# Patient Record
Sex: Male | Born: 1950 | Race: White | Hispanic: No | Marital: Married | Smoking: Former smoker
Health system: Southern US, Community
[De-identification: ages and names within clinical notes are randomized; demographics above are authoritative.]

## PROBLEM LIST (undated history)

## (undated) DIAGNOSIS — G2581 Restless legs syndrome: Secondary | ICD-10-CM

## (undated) DIAGNOSIS — F329 Major depressive disorder, single episode, unspecified: Secondary | ICD-10-CM

## (undated) DIAGNOSIS — G300 Alzheimer's disease with early onset: Secondary | ICD-10-CM

## (undated) DIAGNOSIS — E559 Vitamin D deficiency, unspecified: Secondary | ICD-10-CM

## (undated) DIAGNOSIS — M109 Gout, unspecified: Secondary | ICD-10-CM

## (undated) DIAGNOSIS — R7303 Prediabetes: Secondary | ICD-10-CM

## (undated) DIAGNOSIS — G8929 Other chronic pain: Secondary | ICD-10-CM

## (undated) DIAGNOSIS — N400 Enlarged prostate without lower urinary tract symptoms: Secondary | ICD-10-CM

## (undated) DIAGNOSIS — K5900 Constipation, unspecified: Secondary | ICD-10-CM

## (undated) DIAGNOSIS — J449 Chronic obstructive pulmonary disease, unspecified: Secondary | ICD-10-CM

## (undated) DIAGNOSIS — G4700 Insomnia, unspecified: Secondary | ICD-10-CM

## (undated) DIAGNOSIS — J189 Pneumonia, unspecified organism: Secondary | ICD-10-CM

## (undated) DIAGNOSIS — N189 Chronic kidney disease, unspecified: Secondary | ICD-10-CM

## (undated) DIAGNOSIS — M21372 Foot drop, left foot: Secondary | ICD-10-CM

## (undated) DIAGNOSIS — G4730 Sleep apnea, unspecified: Secondary | ICD-10-CM

## (undated) DIAGNOSIS — E876 Hypokalemia: Secondary | ICD-10-CM

## (undated) DIAGNOSIS — E119 Type 2 diabetes mellitus without complications: Secondary | ICD-10-CM

## (undated) DIAGNOSIS — F32A Depression, unspecified: Secondary | ICD-10-CM

## (undated) DIAGNOSIS — R296 Repeated falls: Secondary | ICD-10-CM

## (undated) DIAGNOSIS — Z87442 Personal history of urinary calculi: Secondary | ICD-10-CM

## (undated) DIAGNOSIS — R413 Other amnesia: Principal | ICD-10-CM

## (undated) DIAGNOSIS — I10 Essential (primary) hypertension: Secondary | ICD-10-CM

## (undated) DIAGNOSIS — R569 Unspecified convulsions: Secondary | ICD-10-CM

## (undated) DIAGNOSIS — R630 Anorexia: Secondary | ICD-10-CM

## (undated) DIAGNOSIS — C61 Malignant neoplasm of prostate: Secondary | ICD-10-CM

## (undated) DIAGNOSIS — Z86711 Personal history of pulmonary embolism: Secondary | ICD-10-CM

## (undated) DIAGNOSIS — F419 Anxiety disorder, unspecified: Secondary | ICD-10-CM

## (undated) DIAGNOSIS — R269 Unspecified abnormalities of gait and mobility: Secondary | ICD-10-CM

## (undated) HISTORY — PX: PROSTATECTOMY: SHX69

## (undated) HISTORY — DX: Unspecified convulsions: R56.9

## (undated) HISTORY — PX: TONSILLECTOMY AND ADENOIDECTOMY: SUR1326

## (undated) HISTORY — DX: Major depressive disorder, single episode, unspecified: F32.9

## (undated) HISTORY — DX: Essential (primary) hypertension: I10

## (undated) HISTORY — DX: Other amnesia: R41.3

## (undated) HISTORY — DX: Chronic obstructive pulmonary disease, unspecified: J44.9

## (undated) HISTORY — DX: Gout, unspecified: M10.9

## (undated) HISTORY — DX: Malignant neoplasm of prostate: C61

## (undated) HISTORY — DX: Restless legs syndrome: G25.81

## (undated) HISTORY — DX: Depression, unspecified: F32.A

## (undated) HISTORY — DX: Unspecified abnormalities of gait and mobility: R26.9

## (undated) HISTORY — DX: Foot drop, left foot: M21.372

---

## 2000-02-27 ENCOUNTER — Encounter: Payer: Self-pay | Admitting: Family Medicine

## 2000-02-27 ENCOUNTER — Encounter: Admission: RE | Admit: 2000-02-27 | Discharge: 2000-02-27 | Payer: Self-pay | Admitting: Family Medicine

## 2000-08-02 ENCOUNTER — Emergency Department (HOSPITAL_COMMUNITY): Admission: EM | Admit: 2000-08-02 | Discharge: 2000-08-02 | Payer: Self-pay | Admitting: Internal Medicine

## 2004-11-28 ENCOUNTER — Ambulatory Visit: Admission: RE | Admit: 2004-11-28 | Discharge: 2005-02-13 | Payer: Self-pay | Admitting: *Deleted

## 2005-03-03 ENCOUNTER — Ambulatory Visit: Admission: RE | Admit: 2005-03-03 | Discharge: 2005-03-06 | Payer: Self-pay | Admitting: *Deleted

## 2006-12-10 ENCOUNTER — Encounter: Admission: RE | Admit: 2006-12-10 | Discharge: 2006-12-10 | Payer: Self-pay | Admitting: Family Medicine

## 2007-03-10 HISTORY — PX: COLONOSCOPY: SHX174

## 2013-01-03 ENCOUNTER — Other Ambulatory Visit: Payer: Self-pay | Admitting: Family Medicine

## 2013-01-03 DIAGNOSIS — R748 Abnormal levels of other serum enzymes: Secondary | ICD-10-CM

## 2013-01-06 ENCOUNTER — Other Ambulatory Visit: Payer: Self-pay

## 2013-01-10 ENCOUNTER — Other Ambulatory Visit: Payer: Self-pay

## 2013-01-25 ENCOUNTER — Encounter: Payer: Self-pay | Admitting: Neurology

## 2013-01-26 ENCOUNTER — Encounter: Payer: Self-pay | Admitting: Neurology

## 2013-01-26 ENCOUNTER — Ambulatory Visit (INDEPENDENT_AMBULATORY_CARE_PROVIDER_SITE_OTHER): Payer: BC Managed Care – PPO | Admitting: Neurology

## 2013-01-26 ENCOUNTER — Encounter (INDEPENDENT_AMBULATORY_CARE_PROVIDER_SITE_OTHER): Payer: Self-pay

## 2013-01-26 VITALS — BP 142/89 | HR 102 | Ht 69.0 in | Wt 184.0 lb

## 2013-01-26 DIAGNOSIS — R413 Other amnesia: Secondary | ICD-10-CM | POA: Insufficient documentation

## 2013-01-26 HISTORY — DX: Other amnesia: R41.3

## 2013-01-26 NOTE — Progress Notes (Signed)
Reason for visit: Memory disturbance  Lucas Vega is a 62 y.o. male  History of present illness:  Lucas Vega is a 62 year old left-handed white male with a history of a problem with a memory disturbance over the last one year. The patient is working, and he is having problems keeping up on the job, remembering how to perform certain tasks. The patient also has difficulty with remembering names for people, and recent events. The patient operates a motor vehicle, and has some problems with directions. The patient is able to keep up with his bills, and get them paid on time. The patient has been under a lot of stress recently with a divorce. The patient is sleeping well, and he denies any problems with excessive daytime drowsiness. The patient denies any balance issues or problems controlling the bowels or the bladder with the exception that he does have some mild incontinence with the bladder following prostate surgery for prostate cancer. The patient indicates that he drinks alcohol regularly, 2-4 glasses of wine daily. The patient admits to a history of alcohol overuse in the past. Recent blood work has shown mild elevation in liver enzymes. The patient is sent to this office for an evaluation. A TSH done recently was normal. The patient has a history of diabetes, but his most recent hemoglobin A1c was 5.2. The patient believes that his depression problems are not well controlled.  Past Medical History  Diagnosis Date  . Hypertension   . COPD (chronic obstructive pulmonary disease)   . Depression   . RLS (restless legs syndrome)   . Gout   . Prostate cancer   . Diabetes   . Memory disturbance 01/26/2013    Past Surgical History  Procedure Laterality Date  . Tonsillectomy and adenoidectomy    . Prostatectomy    . Colonoscopy  2009    JAN    Family History  Problem Relation Age of Onset  . Dementia Mother   . Pancreatic cancer Father     Social history:  reports that he  quit smoking about 3 months ago. He has never used smokeless tobacco. He reports that he drinks alcohol. He reports that he does not use illicit drugs.  Medications:  Current Outpatient Prescriptions on File Prior to Visit  Medication Sig Dispense Refill  . atenolol-chlorthalidone (TENORETIC) 50-25 MG per tablet Take 1 tablet by mouth daily.      Marland Kitchen buPROPion (WELLBUTRIN XL) 150 MG 24 hr tablet Take 150 mg by mouth 2 (two) times daily.      . hydrOXYzine (ATARAX/VISTARIL) 25 MG tablet Take 25 mg by mouth 3 (three) times daily as needed.       No current facility-administered medications on file prior to visit.     No Known Allergies  ROS:  Out of a complete 14 system review of symptoms, the patient complains only of the following symptoms, and all other reviewed systems are negative.  Blurred vision Snoring Urination problems Memory loss Depression, anxiety, and disinterest in activities, suicidal thoughts Restless legs  Blood pressure 142/89, pulse 102, height 5\' 9"  (1.753 m), weight 184 lb (83.462 kg).  Physical Exam  General: The patient is alert and cooperative at the time of the examination.  Head: Pupils are equal, round, and reactive to light. Discs are flat bilaterally.  Neck: The neck is supple, no carotid bruits are noted.  Respiratory: The respiratory examination is clear.  Cardiovascular: The cardiovascular examination reveals a regular rate and rhythm, no  obvious murmurs or rubs are noted.  Skin: Extremities are without significant edema.  Neurologic Exam  Mental status: The mental status examination done today shows a total score of 27/30.  Cranial nerves: Facial symmetry is present. There is good sensation of the face to pinprick and soft touch bilaterally. The strength of the facial muscles and the muscles to head turning and shoulder shrug are normal bilaterally. Speech is well enunciated, no aphasia or dysarthria is noted. Extraocular movements are full.  Visual fields are full.  Motor: The motor testing reveals 5 over 5 strength of all 4 extremities. Good symmetric motor tone is noted throughout.  Sensory: Sensory testing is intact to pinprick, soft touch, vibration sensation, and position sense on all 4 extremities. No evidence of extinction is noted.  Coordination: Cerebellar testing reveals good finger-nose-finger and heel-to-shin bilaterally. A mild intention tremor seen with finger-nose-finger bilaterally.  Gait and station: Gait is normal. Tandem gait is normal. Romberg is negative. No drift is seen.  Reflexes: Deep tendon reflexes are symmetric and normal bilaterally. Toes are downgoing bilaterally.   Assessment/Plan:  1. Memory disturbance  2. Depression and anxiety  3. Alcohol overuse  The patient is under a lot of stress recently, and he is likely is using too much alcohol. The patient reports a memory issue over the last one year. The patient will undergo further blood work today, and an MRI of the brain. Neuropsychological testing will be done to help differentiate an organic dementing illness from a pseudodementia associated with depression. The patient will followup in 6 months.  Marlan Palau MD 01/26/2013 7:20 PM  [facility] [address] [place], [place] [address]  Phone [phone] Fax [phone]

## 2013-01-30 LAB — RPR: RPR: NONREACTIVE

## 2013-01-30 LAB — VITAMIN B12: Vitamin B-12: 343 pg/mL (ref 211–946)

## 2013-01-31 ENCOUNTER — Telehealth: Payer: Self-pay

## 2013-01-31 NOTE — Telephone Encounter (Signed)
Message copied by Doree Barthel on Tue Jan 31, 2013 10:33 AM ------      Message from: Stephanie Acre      Created: Mon Jan 30, 2013  7:30 AM       Please call the patient. The blood work results are unremarkable. Thank you.            ----- Message -----         From: [facility] Lab Results In Interface         Sent: 01/30/2013   5:37 AM           To: York Spaniel, MD                   ------

## 2013-01-31 NOTE — Telephone Encounter (Signed)
Gave lab results. Left vmail.

## 2013-02-16 ENCOUNTER — Ambulatory Visit (INDEPENDENT_AMBULATORY_CARE_PROVIDER_SITE_OTHER): Payer: BC Managed Care – PPO

## 2013-02-16 DIAGNOSIS — R413 Other amnesia: Secondary | ICD-10-CM

## 2013-02-17 ENCOUNTER — Telehealth: Payer: Self-pay | Admitting: Neurology

## 2013-02-17 DIAGNOSIS — R413 Other amnesia: Secondary | ICD-10-CM

## 2013-02-17 NOTE — Telephone Encounter (Signed)
I called the patient. MRI the brain showed minimal small vessel changes. Some atrophy is seen. No acute changes are noted. The patient has not yet heard about the neuropsychological evaluation, the order was placed previously, I will reorder.

## 2013-03-09 DIAGNOSIS — G300 Alzheimer's disease with early onset: Secondary | ICD-10-CM

## 2013-03-09 HISTORY — DX: Alzheimer's disease with early onset: G30.0

## 2013-07-28 ENCOUNTER — Encounter: Payer: Self-pay | Admitting: Neurology

## 2013-07-28 ENCOUNTER — Encounter (INDEPENDENT_AMBULATORY_CARE_PROVIDER_SITE_OTHER): Payer: Self-pay

## 2013-07-28 ENCOUNTER — Ambulatory Visit (INDEPENDENT_AMBULATORY_CARE_PROVIDER_SITE_OTHER): Payer: No Typology Code available for payment source | Admitting: Neurology

## 2013-07-28 VITALS — BP 140/86 | HR 64 | Wt 194.0 lb

## 2013-07-28 DIAGNOSIS — R413 Other amnesia: Secondary | ICD-10-CM

## 2013-07-28 MED ORDER — DONEPEZIL HCL 5 MG PO TABS: 5.0000 mg | ORAL_TABLET | Freq: Every day | ORAL | Status: DC

## 2013-07-28 NOTE — Patient Instructions (Signed)
   Begin Aricept (donepezil) at 5 mg at night for one month. If this medication is well-tolerated, please call our office and we will call in a prescription for the 10 mg tablets. Look out for side effects that may include nausea, diarrhea, weight loss, or stomach cramps. This medication will also cause a runny nose, therefore there is no need for allergy medications for this purpose.  

## 2013-07-28 NOTE — Progress Notes (Signed)
Reason for visit: Memory disturbance  Lucas Vega is an 63 y.o. male  History of present illness:  Lucas Vega is a 63 year old left-handed white male with a history of a progressive memory disturbance. The patient has a history of depression and a history of alcohol overuse. His wife indicates that he has had some progression of memory since last seen in November 2014. He is having increasing problems with short-term memory, and difficulty operating appliances in the house, such as figuring out how to use the coffeemaker. The patient has not given up any activities of daily living since he was seen last. The patient does operate a motor vehicle, but he will have some problems with directions while driving. He has difficulty keeping up with his medications. He indicates that the depression remains an issue. Neuropsychological evaluations have been ordered on 2 occasions, but the patient has not yet had any testing done. He returns for an evaluation.  Past Medical History  Diagnosis Date  . Hypertension   . COPD (chronic obstructive pulmonary disease)   . Depression   . RLS (restless legs syndrome)   . Gout   . Prostate cancer   . Diabetes   . Memory disturbance 01/26/2013    Past Surgical History  Procedure Laterality Date  . Tonsillectomy and adenoidectomy    . Prostatectomy    . Colonoscopy  2009    JAN    Family History  Problem Relation Age of Onset  . Dementia Mother   . Pancreatic cancer Father     Social history:  reports that he quit smoking about 9 months ago. He has never used smokeless tobacco. He reports that he drinks alcohol. He reports that he does not use illicit drugs.   No Known Allergies  Medications:  Current Outpatient Prescriptions on File Prior to Visit  Medication Sig Dispense Refill  . atenolol-chlorthalidone (TENORETIC) 50-25 MG per tablet Take 1 tablet by mouth daily.      Marland Kitchen buPROPion (WELLBUTRIN XL) 150 MG 24 hr tablet Take 150 mg by  mouth 2 (two) times daily.      . hydrOXYzine (ATARAX/VISTARIL) 25 MG tablet Take 25 mg by mouth 3 (three) times daily as needed.      . simvastatin (ZOCOR) 20 MG tablet Take 20 mg by mouth daily.       No current facility-administered medications on file prior to visit.    ROS:  Out of a complete 14 system review of symptoms, the patient complains only of the following symptoms, and all other reviewed systems are negative.  Restless legs, snoring Incontinence of the bladder, blood in the urine Walking difficulties, knee pain Memory loss Confusion, decreased concentration, depression, nervousness   Blood pressure 140/86, pulse 64, weight 194 lb (87.998 kg).  Physical Exam  General: The patient is alert and cooperative at the time of the examination.  Skin: No significant peripheral edema is noted.   Neurologic Exam  Mental status: The Mini-Mental status examination done today shows a total score 26/30.  Cranial nerves: Facial symmetry is present. Speech is normal, no aphasia or dysarthria is noted. Extraocular movements are full. Visual fields are full.  Motor: The patient has good strength in all 4 extremities.  Sensory examination: Soft touch sensation is symmetric on the face, arms, and legs.  Coordination: The patient has good finger-nose-finger and heel-to-shin bilaterally.  Gait and station: The patient has a limping gait on the right leg. Tandem gait is slightly unsteady.  Romberg is negative. No drift is seen.  Reflexes: Deep tendon reflexes are symmetric.   Assessment/Plan:  One. Progressive memory disturbance  The patient clinically is worsening some with his memory. His mother had onset of Alzheimer disease in her early 14s. The patient will be placed on Aricept at this time. Given the history of alcohol overuse and depression, the patient once again will be referred for neuropsychological evaluation. The driving issue needs to be scrutinized closely, and  this activity will need to be curtailed if safety issues are noted. He will followup otherwise in 6 months.  Jill Alexanders MD 07/28/2013 9:16 AM  [facility] [address] [place], [place] [address]  Phone [phone] Fax [phone]

## 2013-10-06 ENCOUNTER — Telehealth: Payer: Self-pay | Admitting: Neurology

## 2013-10-06 MED ORDER — DONEPEZIL HCL 5 MG PO TABS: 5.0000 mg | ORAL_TABLET | Freq: Every day | ORAL | Status: DC

## 2013-10-06 NOTE — Telephone Encounter (Signed)
Spouse stated she called pharmacy for Rx refill for donepezil (ARICEPT) 5 MG tablet.  They informed her that they contacted our office and hadn't heard back.  Please send Rx refill to [facility] on [address].  Please call anytime and advise, if not available please leave message.  Thanks

## 2013-10-06 NOTE — Telephone Encounter (Signed)
Unfortunately, we have not gotten a request for this patient as of the present time.  There is not a [facility] listed on [address].  I called back to verify pharmacy info.  They would like it sent to [facility] on [address].  Rx has been sent.

## 2013-11-30 DIAGNOSIS — R413 Other amnesia: Secondary | ICD-10-CM

## 2013-12-08 ENCOUNTER — Telehealth: Payer: Self-pay | Admitting: Neurology

## 2013-12-08 MED ORDER — DONEPEZIL HCL 10 MG PO TABS: 10.0000 mg | ORAL_TABLET | Freq: Every day | ORAL | Status: DC

## 2013-12-08 NOTE — Telephone Encounter (Signed)
I called patient. 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 The patient is on low-dose Aricept at this time. I will increase the dose to a 10 mg tablet of Aricept at night. He will followup in December of 2015.

## 2014-01-03 ENCOUNTER — Telehealth: Payer: Self-pay | Admitting: Neurology

## 2014-01-03 MED ORDER — DONEPEZIL HCL 10 MG PO TABS: 10.0000 mg | ORAL_TABLET | Freq: Every day | ORAL | Status: DC

## 2014-01-03 NOTE — Telephone Encounter (Signed)
Eddy, Chartered certified accountant at [facility] [phone], stated pt was there to pick up new Rx refill for donepezil (ARICEPT) 10 MG tablet.  Did not have record of where Rx was received on there end. I informed her our records showed fax confirmation.  Please resend.

## 2014-01-03 NOTE — Telephone Encounter (Signed)
Rx was previously sent:  donepezil (ARICEPT) 10 MG tablet 90 tablet 1 12/08/2013     Sig - Route: Take 1 tablet (10 mg total) by mouth at bedtime. - Oral    E-Prescribing Status: Receipt confirmed by pharmacy (12/08/2013 4:01 PM EDT)                Pharmacy    [facility] [place], [place]   I have resent the Rx.

## 2014-01-24 ENCOUNTER — Encounter: Payer: Self-pay | Admitting: Neurology

## 2014-01-30 ENCOUNTER — Encounter: Payer: Self-pay | Admitting: Neurology

## 2014-02-06 ENCOUNTER — Encounter: Payer: Self-pay | Admitting: Adult Health

## 2014-02-06 ENCOUNTER — Ambulatory Visit (INDEPENDENT_AMBULATORY_CARE_PROVIDER_SITE_OTHER): Payer: No Typology Code available for payment source | Admitting: Adult Health

## 2014-02-06 VITALS — BP 142/80 | HR 51 | Temp 96.5°F | Ht 69.0 in | Wt 180.0 lb

## 2014-02-06 DIAGNOSIS — R413 Other amnesia: Secondary | ICD-10-CM

## 2014-02-06 MED ORDER — MEMANTINE HCL 28 X 5 MG & 21 X 10 MG PO TABS: ORAL_TABLET | ORAL | Status: DC

## 2014-02-06 NOTE — Progress Notes (Signed)
I have read the note, and I agree with the clinical assessment and plan.  WILLIS,CHARLES KEITH   

## 2014-02-06 NOTE — Progress Notes (Addendum)
PATIENT: Lucas Vega DOB: 25-Oct-1950  REASON FOR VISIT: follow up HISTORY FROM: patient  HISTORY OF PRESENT ILLNESS: Mr. Lucas Vega is a 63 year old male with a history of a progressive memory disorder. He returns today for follow-up. He is currently taking Aricept 10 mg daily. He reports that his memory has gotten worse since the last visit. His wife reports that it might be slightly worse but nothing drastic.  The patient is able to complete all ADLs independently. Denies having to give up anything due to his memory. He continues to operate a motor vehicle without difficulty. Husband does do some of the finances and has noticed some issues with remembering it things have or have not been paid. Although he states that he has always been like this.,Wife has always done the cooking at home.   HISTORY 07/28/13 (WILLIS): Mr. Lucas Vega is a 63 year old left-handed white male with a history of a progressive memory disturbance. The patient has a history of depression and a history of alcohol overuse. His wife indicates that he has had some progression of memory since last seen in November 2014. He is having increasing problems with short-term memory, and difficulty operating appliances in the house, such as figuring out how to use the coffeemaker. The patient has not given up any activities of daily living since he was seen last. The patient does operate a motor vehicle, but he will have some problems with directions while driving. He has difficulty keeping up with his medications. He indicates that the depression remains an issue. Neuropsychological evaluations have been ordered on 2 occasions, but the patient has not yet had any testing done. He returns for an evaluation.  REVIEW OF SYSTEMS: Out of a complete 14 system review of symptoms, the patient complains only of the following symptoms, and all other reviewed systems are negative.  Memory loss Depression Nervous/anxious Restless  leg  ALLERGIES: No Known Allergies  HOME MEDICATIONS: Outpatient Prescriptions Prior to Visit  Medication Sig Dispense Refill  . allopurinol (ZYLOPRIM) 100 MG tablet Take 100 mg by mouth daily.    Marland Kitchen atenolol-chlorthalidone (TENORETIC) 50-25 MG per tablet Take 1 tablet by mouth daily.    Marland Kitchen buPROPion (WELLBUTRIN XL) 150 MG 24 hr tablet Take 150 mg by mouth 2 (two) times daily.    Marland Kitchen donepezil (ARICEPT) 10 MG tablet Take 1 tablet (10 mg total) by mouth at bedtime. 90 tablet 1  . simvastatin (ZOCOR) 20 MG tablet Take 20 mg by mouth daily.    . hydrOXYzine (ATARAX/VISTARIL) 25 MG tablet Take 25 mg by mouth 3 (three) times daily as needed.     No facility-administered medications prior to visit.    PAST MEDICAL HISTORY: Past Medical History  Diagnosis Date  . Hypertension   . COPD (chronic obstructive pulmonary disease)   . Depression   . RLS (restless legs syndrome)   . Gout   . Prostate cancer   . Diabetes   . Memory disturbance 01/26/2013    PAST SURGICAL HISTORY: Past Surgical History  Procedure Laterality Date  . Tonsillectomy and adenoidectomy    . Prostatectomy    . Colonoscopy  2009    JAN    FAMILY HISTORY: Family History  Problem Relation Age of Onset  . Dementia Mother   . Pancreatic cancer Father     SOCIAL HISTORY: History   Social History  . Marital Status: Married    Spouse Name: N/A    Number of Children: 2  . Years of  Education: college   Occupational History  . [employer] Other   Social History Main Topics  . Smoking status: Former Smoker    Quit date: 09/29/2012  . Smokeless tobacco: Never Used  . Alcohol Use: Yes     Comment: 2-4 GLASSES OF WINE PER DAY  . Drug Use: No  . Sexual Activity: Not on file   Other Topics Concern  . Not on file   Social History Narrative      PHYSICAL EXAM  Filed Vitals:   02/06/14 1434  BP: 142/80  Pulse: 51  Temp: 96.5 F (35.8 C)  TempSrc: Oral  Height: 5\' 9"  (1.753 m)  Weight: 180 lb  (81.647 kg)   Body mass index is 26.57 kg/(m^2).  Generalized: Well developed, in no acute distress   Neurological examination  Mentation: Alert oriented to time, place, history taking. Follows all commands speech and language fluent. MMSE 28/30 Cranial nerve II-XII: Pupils were equal round reactive to light. Extraocular movements were full, visual field were full on confrontational test. Facial sensation and strength were normal. Uvula tongue midline. Head turning and shoulder shrug  were normal and symmetric. Motor: The motor testing reveals 5 over 5 strength of all 4 extremities. Good symmetric motor tone is noted throughout.  Sensory: Sensory testing is intact to soft touch on all 4 extremities. No evidence of extinction is noted.  Coordination: Cerebellar testing reveals good finger-nose-finger and heel-to-shin bilaterally but does require prompting with finger-nose-finger.  Gait and station: Gait is normal. Tandem gait is normal. Romberg is negative. No drift is seen.  Reflexes: Deep tendon reflexes are symmetric and normal bilaterally.    DIAGNOSTIC DATA (LABS, IMAGING, TESTING) - I reviewed patient records, labs, notes, testing and imaging myself where available.   Lab Results  Component Value Date   OINOMVEH20 947 01/26/2013   No results found for: TSH    ASSESSMENT AND PLAN 63 y.o. year old male  has a past medical history of Hypertension; COPD (chronic obstructive pulmonary disease); Depression; RLS (restless legs syndrome); Gout; Prostate cancer; Diabetes; and Memory disturbance (01/26/2013). here with:  1. Memory loss  The patient's memory has remained stable since the last visit. His MMSE is 28/30 today and was 26/30 at the previous visit. The patient is currently taking Aricept 10 mg daily and tolerating it well. He will continue this. The patient and his wife would like to go ahead and start Namenda considering he has a family history of Alzheimer's. I will start the  Namenda titration pack. If the patient is unable to tolerate this medication he will let us know. He will follow-up in 6 months or sooner if needed.   Ward Givens, MSN, NP-C 02/06/2014, 2:45 PM [facility] [address], [place], [place] [address] [phone]  Note: This document was prepared with digital dictation and possible smart phrase technology. Any transcriptional errors that result from this process are unintentional.

## 2014-02-06 NOTE — Patient Instructions (Signed)
Memantine Tablets  What is this medicine?  MEMANTINE (MEM an teen) is used to treat dementia caused by Alzheimer's disease.  This medicine may be used for other purposes; ask your health care provider or pharmacist if you have questions.  COMMON BRAND NAME(S): Namenda  What should I tell my health care provider before I take this medicine?  They need to know if you have any of these conditions:  -difficulty passing urine  -kidney disease  -liver disease  -seizures  -an unusual or allergic reaction to memantine, other medicines, foods, dyes, or preservatives  -pregnant or trying to get pregnant  -breast-feeding  How should I use this medicine?  Take this medicine by mouth with a glass of water. Follow the directions on the prescription label. You may take this medicine with or without food. Take your doses at regular intervals. Do not take your medicine more often than directed. Continue to take your medicine even if you feel better. Do not stop taking except on the advice of your doctor or health care professional.  Talk to your pediatrician regarding the use of this medicine in children. Special care may be needed.  Overdosage: If you think you have taken too much of this medicine contact a poison control center or emergency room at once.  NOTE: This medicine is only for you. Do not share this medicine with others.  What if I miss a dose?  If you miss a dose, take it as soon as you can. If it is almost time for your next dose, take only that dose. Do not take double or extra doses. If you do not take your medicine for several days, contact your health care provider. Your dose may need to be changed.  What may interact with this medicine?  -acetazolamide  -amantadine  -cimetidine  -dextromethorphan  -dofetilide  -hydrochlorothiazide  -ketamine  -metformin  -methazolamide  -quinidine  -ranitidine  -sodium bicarbonate  -triamterene  This list may not describe all possible interactions. Give your health care provider a  list of all the medicines, herbs, non-prescription drugs, or dietary supplements you use. Also tell them if you smoke, drink alcohol, or use illegal drugs. Some items may interact with your medicine.  What should I watch for while using this medicine?  Visit your doctor or health care professional for regular checks on your progress. Check with your doctor or health care professional if there is no improvement in your symptoms or if they get worse.  You may get drowsy or dizzy. Do not drive, use machinery, or do anything that needs mental alertness until you know how this drug affects you. Do not stand or sit up quickly, especially if you are an older patient. This reduces the risk of dizzy or fainting spells. Alcohol can make you more drowsy and dizzy. Avoid alcoholic drinks.  What side effects may I notice from receiving this medicine?  Side effects that you should report to your doctor or health care professional as soon as possible:  -allergic reactions like skin rash, itching or hives, swelling of the face, lips, or tongue  -agitation or a feeling of restlessness  -depressed mood  -dizziness  -hallucinations  -redness, blistering, peeling or loosening of the skin, including inside the mouth  -seizures  -vomiting  Side effects that usually do not require medical attention (report to your doctor or health care professional if they continue or are bothersome):  -constipation  -diarrhea  -headache  -nausea  -trouble sleeping  This   list may not describe all possible side effects. Call your doctor for medical advice about side effects. You may report side effects to FDA at 1-800-FDA-1088.  Where should I keep my medicine?  Keep out of the reach of children.  Store at room temperature between 15 degrees and 30 degrees C (59 degrees and 86 degrees F). Throw away any unused medicine after the expiration date.  NOTE: This sheet is a summary. It may not cover all possible information. If you have questions about this  medicine, talk to your doctor, pharmacist, or health care provider.  © 2015, [employer]. (2012-12-12 14:10:42)

## 2014-05-30 ENCOUNTER — Other Ambulatory Visit: Payer: Self-pay | Admitting: Family Medicine

## 2014-05-30 DIAGNOSIS — R569 Unspecified convulsions: Secondary | ICD-10-CM

## 2014-05-31 ENCOUNTER — Ambulatory Visit
Admission: RE | Admit: 2014-05-31 | Discharge: 2014-05-31 | Disposition: A | Discharge status: Home / Self Care | Payer: 59 | Source: Ambulatory Visit | Attending: Family Medicine | Admitting: Family Medicine

## 2014-05-31 DIAGNOSIS — R569 Unspecified convulsions: Secondary | ICD-10-CM

## 2014-05-31 MED ORDER — GADOBENATE DIMEGLUMINE 529 MG/ML IV SOLN
17.0000 mL | Freq: Once | INTRAVENOUS | Status: AC | PRN
Start: 2014-05-31 — End: 2014-05-31
  Administered 2014-05-31: 17 mL via INTRAVENOUS

## 2014-06-07 ENCOUNTER — Telehealth: Payer: Self-pay | Admitting: *Deleted

## 2014-06-07 NOTE — Telephone Encounter (Signed)
I called the patient. Dr. Marisue Humble ordered the MRI, the results indicate very minimal small vessel ischemic changes. Nothing that should be resulting in significant memory issues. They are to contact our office if they have any further questions.    MRI brain 05/31/14:  IMPRESSION: Mild atrophy and mild chronic microvascular ischemic change. No acute infarct or mass lesion.

## 2014-06-07 NOTE — Telephone Encounter (Signed)
Patient and wife calling in for MRI results, states that they were informed by Dr Andrew Au office to contact Dr Jannifer Franklin for results.

## 2014-06-27 ENCOUNTER — Institutional Professional Consult (permissible substitution): Payer: No Typology Code available for payment source | Admitting: Neurology

## 2014-07-05 ENCOUNTER — Encounter: Payer: Self-pay | Admitting: Neurology

## 2014-07-05 ENCOUNTER — Ambulatory Visit (INDEPENDENT_AMBULATORY_CARE_PROVIDER_SITE_OTHER): Payer: 59 | Admitting: Neurology

## 2014-07-05 VITALS — BP 139/85 | HR 59 | Ht 69.0 in | Wt 180.6 lb

## 2014-07-05 DIAGNOSIS — R413 Other amnesia: Secondary | ICD-10-CM | POA: Diagnosis not present

## 2014-07-05 DIAGNOSIS — R569 Unspecified convulsions: Secondary | ICD-10-CM

## 2014-07-05 DIAGNOSIS — M25511 Pain in right shoulder: Secondary | ICD-10-CM | POA: Insufficient documentation

## 2014-07-05 HISTORY — DX: Unspecified convulsions: R56.9

## 2014-07-05 MED ORDER — DONEPEZIL HCL 5 MG PO TABS: 5.0000 mg | ORAL_TABLET | Freq: Every day | ORAL | Status: DC

## 2014-07-05 MED ORDER — MEMANTINE HCL 5 MG PO TABS: ORAL_TABLET | ORAL | Status: DC

## 2014-07-05 NOTE — Progress Notes (Signed)
Reason for visit: Seizure  Referring physician: Dr. Mayra Neer  Lucas Vega is a 64 y.o. male  History of present illness:  Mr. Poland is a 64 year old left-handed white male with a history of a mild memory disturbance. The patient has been on Aricept taking 10 mg at night. He recently suffered a generalized tonic-clonic seizure that was witnessed by his wife that came out of sleep on 05/27/2014. The patient was convulsing for about 3 or 4 minutes, and then the issue resolved. The patient did not bite his tongue, he did not lose control the bladder. The patient has never had any prior history of seizures, and no family history of seizures. The patient reports that he injured his right shoulder following the seizure, he has had restriction of his ability to abduct the arm since that time. The patient has had some headaches in the afternoon that are mild, and he reports some blurring of vision. He had MRI evaluation of the brain that did not reveal any acute changes. He drinks 2 glasses of wine daily on average, and no change in alcohol intake has been noted. He returns to the office today for an evaluation. He has been operating a motor vehicle.  Past Medical History  Diagnosis Date  . Hypertension   . COPD (chronic obstructive pulmonary disease)   . Depression   . RLS (restless legs syndrome)   . Gout   . Prostate cancer   . Diabetes   . Memory disturbance 01/26/2013  . Convulsions/seizures 07/05/2014    Past Surgical History  Procedure Laterality Date  . Tonsillectomy and adenoidectomy    . Prostatectomy    . Colonoscopy  2009    JAN    Family History  Problem Relation Age of Onset  . Dementia Mother   . Pancreatic cancer Father   . Seizures Neg Hx     Social history:  reports that he quit smoking about 21 months ago. He has never used smokeless tobacco. He reports that he drinks about 8.4 oz of alcohol per week. He reports that he does not use illicit  drugs.  Medications:  Prior to Admission medications   Medication Sig Start Date End Date Taking? Authorizing Provider  allopurinol (ZYLOPRIM) 100 MG tablet Take 100 mg by mouth daily. 05/11/13  Yes Historical Provider, MD  aspirin 81 MG tablet Take 81 mg by mouth daily.   Yes Historical Provider, MD  atenolol-chlorthalidone (TENORETIC) 50-25 MG per tablet Take 1 tablet by mouth daily.   Yes Historical Provider, MD  buPROPion (WELLBUTRIN XL) 150 MG 24 hr tablet Take 150 mg by mouth 2 (two) times daily.   Yes Historical Provider, MD  colchicine 0.6 MG tablet Take 0.6 mg by mouth daily.   Yes Historical Provider, MD  donepezil (ARICEPT) 10 MG tablet Take 1 tablet (10 mg total) by mouth at bedtime. 01/03/14  Yes Kathrynn Ducking, MD  sildenafil (VIAGRA) 100 MG tablet Take 100 mg by mouth daily as needed for erectile dysfunction.   Yes Historical Provider, MD  simvastatin (ZOCOR) 20 MG tablet Take 20 mg by mouth daily.   Yes Historical Provider, MD     No Known Allergies  ROS:  Out of a complete 14 system review of symptoms, the patient complains only of the following symptoms, and all other reviewed systems are negative.  Memory loss, confusion, seizure Impotence  Blood pressure 139/85, pulse 59, height 5\' 9"  (1.753 m), weight 180 lb 9.6 oz (81.92  kg).  Physical Exam  General: The patient is alert and cooperative at the time of the examination.  Eyes: Pupils are equal, round, and reactive to light. Discs are flat bilaterally.  Neck: The neck is supple, no carotid bruits are noted.  Respiratory: The respiratory examination is clear.  Cardiovascular: The cardiovascular examination reveals a regular rate and rhythm, no obvious murmurs or rubs are noted.  Skin: Extremities are without significant edema.  Neurologic Exam  Mental status: The patient is alert and oriented x 2 at the time of the examination (not oriented to date). The patient has apparent normal recent and remote memory,  with an apparently normal attention span and concentration ability. Mini-Mental Status Examination done today shows a total score 27/30.  Cranial nerves: Facial symmetry is present. There is good sensation of the face to pinprick and soft touch bilaterally. The strength of the facial muscles and the muscles to head turning and shoulder shrug are normal bilaterally. Speech is well enunciated, no aphasia or dysarthria is noted. Extraocular movements are full. Visual fields are full. The tongue is midline, and the patient has symmetric elevation of the soft palate. No obvious hearing deficits are noted.  Motor: The motor testing reveals 5 over 5 strength of all 4 extremities. Good symmetric motor tone is noted throughout.  Sensory: Sensory testing is intact to pinprick, soft touch, vibration sensation, and position sense on all 4 extremities. No evidence of extinction is noted.  Coordination: Cerebellar testing reveals good finger-nose-finger and heel-to-shin bilaterally.  Gait and station: Gait is normal. Tandem gait is normal. Romberg is negative. No drift is seen.  Reflexes: Deep tendon reflexes are symmetric and normal bilaterally. Toes are downgoing bilaterally.   MRI brain 05/31/14:  IMPRESSION: Mild atrophy and mild chronic microvascular ischemic change. No acute infarct or mass lesion.  * MRI scan images were reviewed online. I agree with the written report.    Assessment/Plan:  1. Memory disorder  2. Seizure, new onset  The patient has had a single generalized seizure at night. He is on Aricept, which can lower the seizure threshold. The patient will drop the Aricept dose to 5 mg daily. Namenda will be added to the regimen. The patient has restriction of movement across the right shoulder, he will be set up for physical therapy. If this is not effective, an orthopedic referral may be in order. The patient is not to operate a motor vehicle for 6 months following the last seizure  event. We will not initiate anticonvulsant therapy at this time unless the EEG study is abnormal. EEG has been ordered. He will follow-up in 6 months otherwise.  Jill Alexanders MD 07/05/2014 7:36 PM  [facility] [address] [place], [place] [address]  Phone [phone] Fax [phone]

## 2014-07-05 NOTE — Patient Instructions (Signed)

## 2014-07-13 ENCOUNTER — Ambulatory Visit (INDEPENDENT_AMBULATORY_CARE_PROVIDER_SITE_OTHER): Payer: 59 | Admitting: Neurology

## 2014-07-13 ENCOUNTER — Telehealth: Payer: Self-pay | Admitting: Neurology

## 2014-07-13 DIAGNOSIS — R569 Unspecified convulsions: Secondary | ICD-10-CM | POA: Diagnosis not present

## 2014-07-13 DIAGNOSIS — R413 Other amnesia: Secondary | ICD-10-CM

## 2014-07-13 NOTE — Telephone Encounter (Signed)
I called the wife. The EEG study was not completed normal, showing bursts of hyperdensity theta activity. The clinical significance of this is not completely clear, but it could represent an interictal pattern. I will opt to watch the patient conservatively, lower the Aricept dose, the patient was also on Wellbutrin. If he has another clinical event, we will start medications.

## 2014-07-13 NOTE — Procedures (Signed)
     History: Lucas Vega is a 64 year old gentleman with a history of a mild memory disturbance. He is on Aricept for this. On 05/27/2014, he had a witnessed generalized seizure coming out of sleep. The patient being evaluated for this event.  This is a routine EEG. No skull defects are noted. Medications include allopurinol, aspirin, Wellbutrin, colchicine, Aricept, Namenda, Viagra, and Zocor.  EEG classification: Dysrhythmia grade 1 generalized  Description of the recording: The background rhythms of this recording consists of a relatively well-modulated medium amplitude alpha rhythm of 8 Hz that is reactive to eye opening closure. As the record progresses, photic stimulation is performed, this results in a bilateral and symmetric photic driving response. Hyperventilation is also performed, this results in some buildup of the background rhythm activities without significant slowing seen. Intermittently during the recording, bursts of higher amplitude generalized 5 Hz theta slowing are seen lasting about one second. This is seen on multiple occasions during the recording. At no time does there appear to be evidence of actual spike or spike-wave discharges or evidence of focal slowing. EKG monitor shows no evidence of cardiac rhythm abnormalities with a heart rate of 56.  Impression: This is an abnormal EEG recording secondary to generalized episodic bursts of theta slowing. The study is not clearly epileptiform in nature, but any paroxysmal discharge may be suggestive of epilepsy. Clinical correlation is required. No electrographic seizures were recorded.

## 2014-08-29 ENCOUNTER — Ambulatory Visit (INDEPENDENT_AMBULATORY_CARE_PROVIDER_SITE_OTHER): Payer: 59 | Admitting: Adult Health

## 2014-08-29 ENCOUNTER — Encounter: Payer: Self-pay | Admitting: Adult Health

## 2014-08-29 VITALS — BP 140/96 | HR 75 | Ht 69.0 in | Wt 182.0 lb

## 2014-08-29 DIAGNOSIS — R413 Other amnesia: Secondary | ICD-10-CM

## 2014-08-29 DIAGNOSIS — R569 Unspecified convulsions: Secondary | ICD-10-CM | POA: Diagnosis not present

## 2014-08-29 MED ORDER — MEMANTINE HCL 10 MG PO TABS: 10.0000 mg | ORAL_TABLET | Freq: Two times a day (BID) | ORAL | Status: AC

## 2014-08-29 NOTE — Patient Instructions (Signed)
Continue Aricept 5 mg daily. Continue Namenda 10 mg BID. If you have any seizure events please let us know Follow-up with your primary care MD regarding shoulder.

## 2014-08-29 NOTE — Progress Notes (Signed)
I have read the note, and I agree with the clinical assessment and plan.  WILLIS,CHARLES KEITH   

## 2014-08-29 NOTE — Progress Notes (Signed)
PATIENT: Lucas Vega DOB: [birth year]  REASON FOR VISIT: follow up- seizures, mild memory disturbance HISTORY FROM: patient  HISTORY OF PRESENT ILLNESS: Lucas Vega is a 64 year old male with a history of mild memory service and seizures. He returns today for follow-up. At the last visit the patient's Aricept was decreased to 5 mg after he suffered a seizure. He is currently not on any anticonvulsive therapy. Patient did have an EEG that was abnormal but unsure of its clinical significance. Since last visit the patient has not had any additional seizure activity. Patient feels that his memory has remained the same. Patient states he is able to complete all ADLs independently. He does operate a Teacher, music. He states that he tends to use the GPS on his phone to avoid getting lost. He denies ever getting lost. He continues to have limited mobility of the right shoulder. The patient never followed through with physical therapy. He denies any new medical issues. He returns today for an evaluation.  HISTORY 07/05/14 (WILLIS): Lucas Vega is a 64 year old left-handed white male with a history of a mild memory disturbance. The patient has been on Aricept taking 10 mg at night. He recently suffered a generalized tonic-clonic seizure that was witnessed by his wife that came out of sleep on 05/27/2014. The patient was convulsing for about 3 or 4 minutes, and then the issue resolved. The patient did not bite his tongue, he did not lose control the bladder. The patient has never had any prior history of seizures, and no family history of seizures. The patient reports that he injured his right shoulder following the seizure, he has had restriction of his ability to abduct the arm since that time. The patient has had some headaches in the afternoon that are mild, and he reports some blurring of vision. He had MRI evaluation of the brain that did not reveal any acute changes. He drinks 2 glasses of wine daily  on average, and no change in alcohol intake has been noted. He returns to the office today for an evaluation. He has been operating a motor vehicle.  REVIEW OF SYSTEMS: Out of a complete 14 system review of symptoms, the patient complains only of the following symptoms, and all other reviewed systems are negative.  Memory loss   ALLERGIES: No Known Allergies  HOME MEDICATIONS: Outpatient Prescriptions Prior to Visit  Medication Sig Dispense Refill  . allopurinol (ZYLOPRIM) 100 MG tablet Take 100 mg by mouth daily.    Marland Kitchen aspirin 81 MG tablet Take 81 mg by mouth daily.    Marland Kitchen atenolol-chlorthalidone (TENORETIC) 50-25 MG per tablet Take 1 tablet by mouth daily.    Marland Kitchen buPROPion (WELLBUTRIN XL) 150 MG 24 hr tablet Take 150 mg by mouth 2 (two) times daily.    . colchicine 0.6 MG tablet Take 0.6 mg by mouth daily.    Marland Kitchen donepezil (ARICEPT) 5 MG tablet Take 1 tablet (5 mg total) by mouth at bedtime. 30 tablet 5  . memantine (NAMENDA) 5 MG tablet 1 tablet daily for 7 days, then 1 twice a day for 7 days, then 1 in the morning and 2 in the evening for 7 days, then 2 twice a day 120 tablet 1  . sildenafil (VIAGRA) 100 MG tablet Take 100 mg by mouth daily as needed for erectile dysfunction.    . simvastatin (ZOCOR) 20 MG tablet Take 20 mg by mouth daily.     No facility-administered medications prior to visit.  PAST MEDICAL HISTORY: Past Medical History  Diagnosis Date  . Hypertension   . COPD (chronic obstructive pulmonary disease)   . Depression   . RLS (restless legs syndrome)   . Gout   . Prostate cancer   . Diabetes   . Memory disturbance 01/26/2013  . Convulsions/seizures 07/05/2014    PAST SURGICAL HISTORY: Past Surgical History  Procedure Laterality Date  . Tonsillectomy and adenoidectomy    . Prostatectomy    . Colonoscopy  2009    JAN    FAMILY HISTORY: Family History  Problem Relation Age of Onset  . Dementia Mother   . Pancreatic cancer Father   . Seizures Neg Hx      PHYSICAL EXAM  Filed Vitals:   08/29/14 1356  BP: 140/96  Pulse: 75  Height: 5\' 9"  (1.753 m)  Weight: 182 lb (82.555 kg)   Body mass index is 26.86 kg/(m^2).  Generalized: Well developed, in no acute distress   Neurological examination  Mentation: Alert oriented to time, place, history taking. Follows all commands speech and language fluent Cranial nerve II-XII: Pupils were equal round reactive to light. Extraocular movements were full, visual field were full on confrontational test. Facial sensation and strength were normal. Uvula tongue midline. Head turning and shoulder shrug  were normal and symmetric. Motor: The motor testing reveals 5 over 5 strength of all 4 extremities. Good symmetric motor tone is noted throughout.  Limited mobility of the right shoulder. Able to abduct the arm 90. Sensory: Sensory testing is intact to soft touch on all 4 extremities. No evidence of extinction is noted.  Coordination: Cerebellar testing reveals good finger-nose-finger and heel-to-shin bilaterally.  Gait and station: Gait is normal. Tandem gait is normal. Romberg is negative. No drift is seen.  Reflexes: Deep tendon reflexes are symmetric and normal bilaterally.   DIAGNOSTIC DATA (LABS, IMAGING, TESTING) - I reviewed patient records, labs, notes, testing and imaging myself where available.   ASSESSMENT AND PLAN 64 y.o. year old male  has a past medical history of Hypertension; COPD (chronic obstructive pulmonary disease); Depression; RLS (restless legs syndrome); Gout; Prostate cancer; Diabetes; Memory disturbance (01/26/2013); and Convulsions/seizures (07/05/2014). here with:  1. Seizures 2. Memory loss   Overall the patient is doing well. He has not had any seizure events since the last visit. We will continue to monitor this conservatively. Patient's MMSE is 30/30. His memory has remained stable. He will continue taking Aricept 5 mg daily. He will continue taking Namenda 10 mg daily.  I will order him the 10 mg tablet. Patient advised to follow-up with his primary care regarding his ongoing shoulder pain. Verbalized understanding. If his symptoms worsen or he develops new symptoms he will let us know. Otherwise he will follow-up in 6 months or sooner if needed.    Ward Givens, MSN, NP-C 08/29/2014, 1:55 PM [facility] [address] [place], [place] [address] [phone]  Note: This document was prepared with digital dictation and possible smart phrase technology. Any transcriptional errors that result from this process are unintentional.

## 2015-01-04 ENCOUNTER — Telehealth: Payer: Self-pay | Admitting: Neurology

## 2015-01-04 MED ORDER — LEVETIRACETAM 500 MG PO TABS: ORAL_TABLET | ORAL | Status: DC

## 2015-01-04 NOTE — Telephone Encounter (Signed)
I called the patient's wife. She states that the patient was asleep when he made a noise and started having a seizure. She states this seizure was just like the others he has had. It lasted about 5 minutes. The patient is awake and oriented now. He did bite his tongue during the seizure and c/o his mouth being sore. He is currently not on any seizure medications. I advised the patient's wife that I would let Dr. Jannifer Franklin know about this and we would call back with a plan.

## 2015-01-04 NOTE — Telephone Encounter (Signed)
I called the wife. The patient had a second seizure out of sleep. The patient is on Aricept, I will discontinue the medication. I will initiate treatment with Keppra. We will start low-dose, set up a revisit. Prior EEG study showed mild background slowing, dysrhythmia grade 1 record.  The patient is also on Wellbutrin, the dose of this medication should be reduced, and possibly discontinued in the future.

## 2015-01-04 NOTE — Telephone Encounter (Signed)
Pt's wife called and states that pt had a seizure this morning that last at least 5 min or longer. Pt is asleep right now. Wife states that he seems ok right now . Is requesting a call back as soon as possible. Please call and advise [phone]

## 2015-01-07 NOTE — Telephone Encounter (Signed)
I tried to call the patient and his wife. Unable to reach either. I will try again.

## 2015-01-08 NOTE — Telephone Encounter (Signed)
I called and left a voicemail with the patient and his wife asking they call back to schedule a follow up appointment.

## 2015-01-10 NOTE — Telephone Encounter (Signed)
I have also mailed a letter asking the patient to call and schedule an appointment.

## 2015-01-14 NOTE — Telephone Encounter (Signed)
Appt pending 01/22/15

## 2015-01-22 ENCOUNTER — Encounter: Payer: Self-pay | Admitting: Neurology

## 2015-01-22 ENCOUNTER — Encounter (INDEPENDENT_AMBULATORY_CARE_PROVIDER_SITE_OTHER): Payer: Self-pay

## 2015-01-22 ENCOUNTER — Ambulatory Visit (INDEPENDENT_AMBULATORY_CARE_PROVIDER_SITE_OTHER): Payer: 59 | Admitting: Neurology

## 2015-01-22 VITALS — BP 145/92 | HR 66 | Ht 69.0 in | Wt 198.0 lb

## 2015-01-22 DIAGNOSIS — R569 Unspecified convulsions: Secondary | ICD-10-CM | POA: Diagnosis not present

## 2015-01-22 DIAGNOSIS — R413 Other amnesia: Secondary | ICD-10-CM | POA: Diagnosis not present

## 2015-01-22 NOTE — Progress Notes (Signed)
Reason for visit: Seizure  Lucas Vega is an 64 y.o. male  History of present illness:  Lucas Vega is a 64 year old left-handed white male with a history of a memory disturbance on Aricept and Namenda. The patient also is on Wellbutrin for some issues with anxiety and depression. The patient has suffered 2 nocturnal seizures. After the first seizure, the Aricept dose was reduced to 5 mg, and the Aricept was eliminated after the second seizure which occurred recently. The patient is not operating a motor vehicle at this time. He continues on Wellbutrin at 300 mg daily, he takes low-dose alprazolam 0.25 mg twice daily. The patient did have tongue biting during the seizure. He has had an EEG study evaluation that showed mild diffuse slowing, no epileptiform discharges were seen.  Past Medical History  Diagnosis Date  . Hypertension   . COPD (chronic obstructive pulmonary disease) ([place])   . Depression   . RLS (restless legs syndrome)   . Gout   . Prostate cancer ([place])   . Diabetes ([place])   . Memory disturbance 01/26/2013  . Convulsions/seizures ([place]) 07/05/2014    Past Surgical History  Procedure Laterality Date  . Tonsillectomy and adenoidectomy    . Prostatectomy    . Colonoscopy  2009    JAN    Family History  Problem Relation Age of Onset  . Dementia Mother   . Pancreatic cancer Father   . Seizures Neg Hx     Social history:  reports that he quit smoking about 2 years ago. He has never used smokeless tobacco. He reports that he drinks about 8.4 oz of alcohol per week. He reports that he does not use illicit drugs.   No Known Allergies  Medications:  Prior to Admission medications   Medication Sig Start Date End Date Taking? Authorizing Provider  allopurinol (ZYLOPRIM) 100 MG tablet Take 100 mg by mouth daily. 05/11/13  Yes Historical Provider, MD  ALPRAZolam Duanne Moron) 0.25 MG tablet Take 0.25 mg by mouth at bedtime as needed for anxiety.   Yes Historical Provider,  MD  aspirin 81 MG tablet Take 81 mg by mouth daily.   Yes Historical Provider, MD  atenolol-chlorthalidone (TENORETIC) 50-25 MG per tablet Take 1 tablet by mouth daily.   Yes Historical Provider, MD  buPROPion (WELLBUTRIN XL) 300 MG 24 hr tablet Take 300 mg by mouth daily.   Yes Historical Provider, MD  colchicine 0.6 MG tablet Take 0.6 mg by mouth daily.   Yes Historical Provider, MD  levETIRAcetam (KEPPRA) 500 MG tablet One half tablet twice daily for 2 weeks, then take 1 full tablet twice daily 01/04/15  Yes Kathrynn Ducking, MD  memantine (NAMENDA) 10 MG tablet Take 1 tablet (10 mg total) by mouth 2 (two) times daily. 08/29/14  Yes Ward Givens, NP  simvastatin (ZOCOR) 20 MG tablet Take 20 mg by mouth daily.   Yes Historical Provider, MD    ROS:  Out of a complete 14 system review of symptoms, the patient complains only of the following symptoms, and all other reviewed systems are negative.  Memory loss, seizure Depression, anxiety  Blood pressure 145/92, pulse 66, height 5\' 9"  (1.753 m), weight 198 lb (89.812 kg).  Physical Exam  General: The patient is alert and cooperative at the time of the examination.  Skin: No significant peripheral edema is noted.   Neurologic Exam  Mental status: The patient is alert and oriented x 2 at the time of the examination (  not oriented to date). The Mini-Mental Status Examination done today shows a total score 24/30. The patient is able to name 11 animals in 30 seconds.   Cranial nerves: Facial symmetry is present. Speech is normal, no aphasia or dysarthria is noted. Extraocular movements are full. Visual fields are full.  Motor: The patient has good strength in all 4 extremities.  Sensory examination: Soft touch sensation is symmetric on the face, arms, and legs.  Coordination: The patient has good finger-nose-finger and heel-to-shin bilaterally.  Gait and station: The patient has a normal gait. Tandem gait is unsteady. Romberg is  negative. No drift is seen.  Reflexes: Deep tendon reflexes are symmetric.   Assessment/Plan:  1. Memory disturbance  2. Nocturnal seizures  The patient has had 2 nocturnal seizure events, both events occurred while the patient was on Aricept and Wellbutrin. The Aricept has been discontinued, I would recommend that the Wellbutrin be discontinued as well. The patient will be seen by his primary care physician in the near future regarding this. We have discussed going on Keppra, but we have agreed to hold off on initiating this medication until he has come off of the Wellbutrin. If he does have another seizure, Keppra will be initiated. He is not to operate a motor vehicle for another 6 months.  Lucas Alexanders MD 01/22/2015 9:40 AM  [facility] [address] [place], [place] [address]  Phone [phone] Fax [phone]

## 2015-01-22 NOTE — Patient Instructions (Addendum)
   Hold off on taking the [place] for now. Would recommend that you stop the Wellbutrin and go to another antidepressant.   Seizure, Adult A seizure is abnormal electrical activity in the brain. Seizures usually last from 30 seconds to 2 minutes. There are various types of seizures. Before a seizure, you may have a warning sensation (aura) that a seizure is about to occur. An aura may include the following symptoms:   Fear or anxiety.  Nausea.  Feeling like the room is spinning (vertigo).  Vision changes, such as seeing flashing lights or spots. Common symptoms during a seizure include:  A change in attention or behavior (altered mental status).  Convulsions with rhythmic jerking movements.  Drooling.  Rapid eye movements.  Grunting.  Loss of bladder and bowel control.  Bitter taste in the mouth.  Tongue biting. After a seizure, you may feel confused and sleepy. You may also have an injury resulting from convulsions during the seizure. HOME CARE INSTRUCTIONS   If you are given medicines, take them exactly as prescribed by your health care provider.  Keep all follow-up appointments as directed by your health care provider.  Do not swim or drive or engage in risky activity during which a seizure could cause further injury to you or others until your health care provider says it is OK.  Get adequate rest.  Teach friends and family what to do if you have a seizure. They should:  Lay you on the ground to prevent a fall.  Put a cushion under your head.  Loosen any tight clothing around your neck.  Turn you on your side. If vomiting occurs, this helps keep your airway clear.  Stay with you until you recover.  Know whether or not you need emergency care. SEEK IMMEDIATE MEDICAL CARE IF:  The seizure lasts longer than 5 minutes.  The seizure is severe or you do not wake up immediately after the seizure.  You have an altered mental status after the seizure.  You  are having more frequent or worsening seizures. Someone should drive you to the emergency department or call local emergency services (911 in [place] MAKE SURE YOU:  Understand these instructions.  Will watch your condition.  Will get help right away if you are not doing well or get worse.   This information is not intended to replace advice given to you by your health care provider. Make sure you discuss any questions you have with your health care provider.   Document Released: 02/21/2000 Document Revised: 03/16/2014 Document Reviewed: 10/05/2012 Elsevier Interactive Patient Education [employer].

## 2015-02-13 ENCOUNTER — Other Ambulatory Visit: Payer: Self-pay | Admitting: Family Medicine

## 2015-02-13 DIAGNOSIS — Z23 Encounter for immunization: Secondary | ICD-10-CM | POA: Diagnosis not present

## 2015-02-13 DIAGNOSIS — F411 Generalized anxiety disorder: Secondary | ICD-10-CM | POA: Diagnosis not present

## 2015-02-13 DIAGNOSIS — Z0000 Encounter for general adult medical examination without abnormal findings: Secondary | ICD-10-CM | POA: Diagnosis not present

## 2015-02-13 DIAGNOSIS — Z139 Encounter for screening, unspecified: Secondary | ICD-10-CM

## 2015-02-13 DIAGNOSIS — N182 Chronic kidney disease, stage 2 (mild): Secondary | ICD-10-CM | POA: Diagnosis not present

## 2015-02-13 DIAGNOSIS — I129 Hypertensive chronic kidney disease with stage 1 through stage 4 chronic kidney disease, or unspecified chronic kidney disease: Secondary | ICD-10-CM | POA: Diagnosis not present

## 2015-02-13 DIAGNOSIS — M109 Gout, unspecified: Secondary | ICD-10-CM | POA: Diagnosis not present

## 2015-02-13 DIAGNOSIS — G309 Alzheimer's disease, unspecified: Secondary | ICD-10-CM | POA: Diagnosis not present

## 2015-02-13 DIAGNOSIS — Z1211 Encounter for screening for malignant neoplasm of colon: Secondary | ICD-10-CM | POA: Diagnosis not present

## 2015-02-13 DIAGNOSIS — Z8546 Personal history of malignant neoplasm of prostate: Secondary | ICD-10-CM | POA: Diagnosis not present

## 2015-02-13 DIAGNOSIS — E1129 Type 2 diabetes mellitus with other diabetic kidney complication: Secondary | ICD-10-CM | POA: Diagnosis not present

## 2015-02-13 DIAGNOSIS — F322 Major depressive disorder, single episode, severe without psychotic features: Secondary | ICD-10-CM | POA: Diagnosis not present

## 2015-02-13 DIAGNOSIS — E7800 Pure hypercholesterolemia, unspecified: Secondary | ICD-10-CM | POA: Diagnosis not present

## 2015-02-14 ENCOUNTER — Telehealth: Payer: Self-pay | Admitting: Neurology

## 2015-02-14 NOTE — Telephone Encounter (Signed)
I have received blood work results from the primary care physician, hemoglobin A1c is 6.1. Comprehensive metabolic profile was unremarkable with exception of a potassium of 3.3. PSA is 0.1.

## 2015-02-20 ENCOUNTER — Other Ambulatory Visit: Payer: 59

## 2015-02-25 ENCOUNTER — Ambulatory Visit: Payer: 59 | Admitting: Adult Health

## 2015-02-26 ENCOUNTER — Ambulatory Visit
Admission: RE | Admit: 2015-02-26 | Discharge: 2015-02-26 | Disposition: A | Discharge status: Home / Self Care | Payer: 59 | Source: Ambulatory Visit | Attending: Family Medicine | Admitting: Family Medicine

## 2015-02-26 ENCOUNTER — Other Ambulatory Visit: Payer: Self-pay | Admitting: Family Medicine

## 2015-02-26 DIAGNOSIS — Z139 Encounter for screening, unspecified: Secondary | ICD-10-CM

## 2015-02-26 DIAGNOSIS — Z136 Encounter for screening for cardiovascular disorders: Secondary | ICD-10-CM | POA: Diagnosis not present

## 2015-02-26 DIAGNOSIS — I77811 Abdominal aortic ectasia: Secondary | ICD-10-CM | POA: Diagnosis not present

## 2015-03-06 ENCOUNTER — Ambulatory Visit: Payer: 59 | Admitting: Adult Health

## 2015-05-22 ENCOUNTER — Encounter: Payer: Self-pay | Admitting: Adult Health

## 2015-05-22 ENCOUNTER — Ambulatory Visit (INDEPENDENT_AMBULATORY_CARE_PROVIDER_SITE_OTHER): Payer: Medicare Other | Admitting: Adult Health

## 2015-05-22 VITALS — BP 122/84 | HR 74 | Resp 14 | Ht 69.0 in | Wt 196.0 lb

## 2015-05-22 DIAGNOSIS — R569 Unspecified convulsions: Secondary | ICD-10-CM | POA: Diagnosis not present

## 2015-05-22 DIAGNOSIS — F0390 Unspecified dementia without behavioral disturbance: Secondary | ICD-10-CM

## 2015-05-22 NOTE — Progress Notes (Signed)
PATIENT: Lucas Vega DOB: Jul 11, 1950  REASON FOR VISIT: follow up- memory, seizures HISTORY FROM: patient  HISTORY OF PRESENT ILLNESS: Mr. Lucas Vega is a 65 year old male with a history of memory disturbance and nocturnal seizure events. He returns today for an evaluation. The patient is now off of Aricept and Wellbutrin. He states that he has not had any additional seizure events. He is not currently operating a motor vehicle however he will be seizure-free for 6 months in April. The patient feels that his memory has remained stable. He is able to complete all ADLs independently. He does not feel that he's had to give up anything due to his memory. His wife handles the finances and prepares all the meals. The wife agrees that she's not seen any significant changes. He has continued on Namenda. He returns today for an evaluation.  HISTORY 01/22/15 (WILLIS): Mr. Lucas Vega is a 65 year old left-handed white male with a history of a memory disturbance on Aricept and Namenda. The patient also is on Wellbutrin for some issues with anxiety and depression. The patient has suffered 2 nocturnal seizures. After the first seizure, the Aricept dose was reduced to 5 mg, and the Aricept was eliminated after the second seizure which occurred recently. The patient is not operating a motor vehicle at this time. He continues on Wellbutrin at 300 mg daily, he takes low-dose alprazolam 0.25 mg twice daily. The patient did have tongue biting during the seizure. He has had an EEG study evaluation that showed mild diffuse slowing, no epileptiform discharges were seen.   REVIEW OF SYSTEMS: Out of a complete 14 system review of symptoms, the patient complains only of the following symptoms, and all other reviewed systems are negative.  Memory loss, nervous/anxious, daytime sleepiness  ALLERGIES: No Known Allergies  HOME MEDICATIONS: Outpatient Prescriptions Prior to Visit  Medication Sig Dispense Refill  .  allopurinol (ZYLOPRIM) 100 MG tablet Take 100 mg by mouth daily.    Marland Kitchen ALPRAZolam (XANAX) 0.25 MG tablet Take 0.25 mg by mouth at bedtime as needed for anxiety.    Marland Kitchen aspirin 81 MG tablet Take 81 mg by mouth daily.    Marland Kitchen atenolol-chlorthalidone (TENORETIC) 50-25 MG per tablet Take 1 tablet by mouth daily.    . memantine (NAMENDA) 10 MG tablet Take 1 tablet (10 mg total) by mouth 2 (two) times daily. 60 tablet 5  . simvastatin (ZOCOR) 20 MG tablet Take 20 mg by mouth daily.    Marland Kitchen buPROPion (WELLBUTRIN XL) 300 MG 24 hr tablet Take 300 mg by mouth daily. Reported on 05/22/2015    . colchicine 0.6 MG tablet Take 0.6 mg by mouth daily. Reported on 05/22/2015     No facility-administered medications prior to visit.    PAST MEDICAL HISTORY: Past Medical History  Diagnosis Date  . Hypertension   . COPD (chronic obstructive pulmonary disease) ([place])   . Depression   . RLS (restless legs syndrome)   . Gout   . Prostate cancer ([place])   . Diabetes ([place])   . Memory disturbance 01/26/2013  . Convulsions/seizures ([place]) 07/05/2014    PAST SURGICAL HISTORY: Past Surgical History  Procedure Laterality Date  . Tonsillectomy and adenoidectomy    . Prostatectomy    . Colonoscopy  2009    JAN    FAMILY HISTORY: Family History  Problem Relation Age of Onset  . Dementia Mother   . Pancreatic cancer Father   . Seizures Neg Hx     SOCIAL HISTORY: Social  History   Social History  . Marital Status: Married    Spouse Name: Cam  . Number of Children: 2  . Years of Education: college   Occupational History  . [employer] Other   Social History Main Topics  . Smoking status: Former Smoker    Quit date: 09/29/2012  . Smokeless tobacco: Never Used  . Alcohol Use: 8.4 oz/week    14 Glasses of wine per week     Comment: 2-4 GLASSES OF WINE PER DAY  . Drug Use: No  . Sexual Activity: Not on file   Other Topics Concern  . Not on file   Social History Narrative   Patient drinks 2 cups of coffee  daily.   Patient lives at home with his wife (Cam)   Retired.   Patient is left handed.      PHYSICAL EXAM  Filed Vitals:   05/22/15 0937  BP: 122/84  Pulse: 74  Resp: 14  Height: 5\' 9"  (1.753 m)  Weight: 196 lb (88.905 kg)   Body mass index is 28.93 kg/(m^2). MMSE - Mini Mental State Exam 05/22/2015 01/22/2015 07/05/2014  Orientation to time 3 2 2   Orientation to Place 4 5 5   Registration 3 3 3   Attention/ Calculation 5 5 5   Recall 0 0 3  Language- name 2 objects 2 2 2   Language- repeat 1 1 1   Language- follow 3 step command 2 3 3   Language- read & follow direction 1 1 1   Write a sentence 1 1 1   Copy design 0 1 1  Total score 22 24 27     Generalized: Well developed, in no acute distress   Neurological examination  Mentation: Alert oriented to time, place, history taking. Follows all commands speech and language fluent Cranial nerve II-XII: Pupils were equal round reactive to light. Extraocular movements were full, visual field were full on confrontational test. Facial sensation and strength were normal. Uvula tongue midline. Head turning and shoulder shrug  were normal and symmetric. Motor: The motor testing reveals 5 over 5 strength of all 4 extremities. Good symmetric motor tone is noted throughout.  Sensory: Sensory testing is intact to soft touch on all 4 extremities. No evidence of extinction is noted.  Coordination: Cerebellar testing reveals good finger-nose-finger and heel-to-shin bilaterally.  Gait and station: Gait is normal. Tandem gait is normal. Romberg is negative. No drift is seen.  Reflexes: Deep tendon reflexes are symmetric and normal bilaterally.   DIAGNOSTIC DATA (LABS, IMAGING, TESTING) - I reviewed patient records, labs, notes, testing and imaging myself where available.     ASSESSMENT AND PLAN 65 y.o. year old male  has a past medical history of Hypertension; COPD (chronic obstructive pulmonary disease) ([place]); Depression; RLS (restless legs  syndrome); Gout; Prostate cancer ([place]); Diabetes ([place]); Memory disturbance (01/26/2013); and Convulsions/seizures ([place]) (07/05/2014). here with:  1. Memory disturbance 2. Seizures  The patient is doing well. He has not had any additional seizure events since stopping Aricept and Wellbutrin. The patient's memory score slightly decreased today. MMSE is 22/30 was previously 24/30. He will continue on Namenda at this time. Patient advised that if his symptoms worsen or he develops any new symptoms he should let us know. If the patient remains seizure-free he can return to driving in one month. He will follow-up in 6 months or sooner if needed.     Ward Givens, MSN, NP-C 05/22/2015, 9:54 AM [facility] [address], [place], [place] [address] [phone])  [phone]    

## 2015-05-22 NOTE — Patient Instructions (Signed)
Continue namenda Memory score is slightly decreased. We will continue to monitor If you have any seizure events please let us know If your symptoms worsen or you develop new symptoms please let us know.

## 2015-05-22 NOTE — Progress Notes (Signed)
The blood work results are unremarkable. Please call the patient.  

## 2015-06-17 DIAGNOSIS — E1129 Type 2 diabetes mellitus with other diabetic kidney complication: Secondary | ICD-10-CM | POA: Diagnosis not present

## 2015-06-17 DIAGNOSIS — E7800 Pure hypercholesterolemia, unspecified: Secondary | ICD-10-CM | POA: Diagnosis not present

## 2015-06-17 DIAGNOSIS — F411 Generalized anxiety disorder: Secondary | ICD-10-CM | POA: Diagnosis not present

## 2015-06-17 DIAGNOSIS — N182 Chronic kidney disease, stage 2 (mild): Secondary | ICD-10-CM | POA: Diagnosis not present

## 2015-06-17 DIAGNOSIS — I129 Hypertensive chronic kidney disease with stage 1 through stage 4 chronic kidney disease, or unspecified chronic kidney disease: Secondary | ICD-10-CM | POA: Diagnosis not present

## 2015-06-17 DIAGNOSIS — R569 Unspecified convulsions: Secondary | ICD-10-CM | POA: Diagnosis not present

## 2015-06-17 DIAGNOSIS — F322 Major depressive disorder, single episode, severe without psychotic features: Secondary | ICD-10-CM | POA: Diagnosis not present

## 2015-06-17 DIAGNOSIS — G309 Alzheimer's disease, unspecified: Secondary | ICD-10-CM | POA: Diagnosis not present

## 2015-09-23 ENCOUNTER — Ambulatory Visit (INDEPENDENT_AMBULATORY_CARE_PROVIDER_SITE_OTHER): Payer: Medicare Other | Admitting: Adult Health

## 2015-09-23 ENCOUNTER — Encounter: Payer: Self-pay | Admitting: Adult Health

## 2015-09-23 VITALS — BP 121/79 | HR 59 | Ht 69.0 in | Wt 188.5 lb

## 2015-09-23 DIAGNOSIS — G4719 Other hypersomnia: Secondary | ICD-10-CM | POA: Diagnosis not present

## 2015-09-23 DIAGNOSIS — R569 Unspecified convulsions: Secondary | ICD-10-CM

## 2015-09-23 DIAGNOSIS — R413 Other amnesia: Secondary | ICD-10-CM

## 2015-09-23 DIAGNOSIS — G40909 Epilepsy, unspecified, not intractable, without status epilepticus: Secondary | ICD-10-CM | POA: Diagnosis not present

## 2015-09-23 DIAGNOSIS — R0681 Apnea, not elsewhere classified: Secondary | ICD-10-CM | POA: Diagnosis not present

## 2015-09-23 NOTE — Patient Instructions (Signed)
Sleep referral Memory stable Continue namenda

## 2015-09-23 NOTE — Progress Notes (Signed)
I agree with the assessment and plan as directed by NP .The patient is known to me .   Wyett Narine, MD  

## 2015-09-23 NOTE — Progress Notes (Signed)
PATIENT: Lucas Vega DOB: 1950/12/27  REASON FOR VISIT: follow up- memory disturbance, nocturnal seizure events HISTORY FROM: patient  HISTORY OF PRESENT ILLNESS: Mr. Lucas Vega is a 65 year old male with a history of memory disturbance and nocturnal seizure events. He returns today for follow-up. Patient reports that his memory has remained stable. He is able to complete all ADLs independently. He states that he drives very infrequently. Occasionally he will drove to the grocery store. He does not feel that he's had to give up anything due to his memory. The patient had been over 6 months seizure-free until last week. The patient had a seizure in his sleep. His wife reports that it was "less intense." She states that he just had heavy breathing but no convulsing in the extremities. After several minutes she was able to wake him. She reports that he is now on Effexor. However he started this in January. His wife does report witnessed apneic events. The patient does report daytime sleepiness. Typically takes a nap in the afternoons. He returns today for an evaluation . HISTORY 05/22/15: Mr. Lucas Vega is a 65 year old male with a history of memory disturbance and nocturnal seizure events. He returns today for an evaluation. The patient is now off of Aricept and Wellbutrin. He states that he has not had any additional seizure events. He is not currently operating a motor vehicle however he will be seizure-free for 6 months in April. The patient feels that his memory has remained stable. He is able to complete all ADLs independently. He does not feel that he's had to give up anything due to his memory. His wife handles the finances and prepares all the meals. The wife agrees that she's not seen any significant changes. He has continued on Namenda. He returns today for an evaluation.  HISTORY 01/22/15 (WILLIS): Mr. Lucas Vega is a 65 year old left-handed white male with a history of a memory disturbance on  Aricept and Namenda. The patient also is on Wellbutrin for some issues with anxiety and depression. The patient has suffered 2 nocturnal seizures. After the first seizure, the Aricept dose was reduced to 5 mg, and the Aricept was eliminated after the second seizure which occurred recently. The patient is not operating a motor vehicle at this time. He continues on Wellbutrin at 300 mg daily, he takes low-dose alprazolam 0.25 mg twice daily. The patient did have tongue biting during the seizure. He has had an EEG study evaluation that showed mild diffuse slowing, no epileptiform discharges were seen.   REVIEW OF SYSTEMS: Out of a complete 14 system review of symptoms, the patient complains only of the following symptoms, and all other reviewed systems are negative.  See history of present illness  ALLERGIES: No Known Allergies  HOME MEDICATIONS: Outpatient Prescriptions Prior to Visit  Medication Sig Dispense Refill  . allopurinol (ZYLOPRIM) 100 MG tablet Take 100 mg by mouth daily.    Marland Kitchen ALPRAZolam (XANAX) 0.25 MG tablet Take 0.25 mg by mouth at bedtime as needed for anxiety.    Marland Kitchen aspirin 81 MG tablet Take 81 mg by mouth daily.    Marland Kitchen atenolol-chlorthalidone (TENORETIC) 50-25 MG per tablet Take 2 tablets by mouth daily.     . memantine (NAMENDA) 10 MG tablet Take 1 tablet (10 mg total) by mouth 2 (two) times daily. 60 tablet 5  . simvastatin (ZOCOR) 20 MG tablet Take 20 mg by mouth daily.    Marland Kitchen buPROPion (WELLBUTRIN XL) 300 MG 24 hr tablet Take 300 mg  by mouth daily. Reported on 05/22/2015    . colchicine 0.6 MG tablet Take 0.6 mg by mouth daily. Reported on 05/22/2015    . sertraline (ZOLOFT) 50 MG tablet      No facility-administered medications prior to visit.    PAST MEDICAL HISTORY: Past Medical History  Diagnosis Date  . Hypertension   . COPD (chronic obstructive pulmonary disease) ([place])   . Depression   . RLS (restless legs syndrome)   . Gout   . Prostate cancer ([place])   . Diabetes  ([place])   . Memory disturbance 01/26/2013  . Convulsions/seizures ([place]) 07/05/2014    PAST SURGICAL HISTORY: Past Surgical History  Procedure Laterality Date  . Tonsillectomy and adenoidectomy    . Prostatectomy    . Colonoscopy  2009    JAN    FAMILY HISTORY: Family History  Problem Relation Age of Onset  . Dementia Mother   . Pancreatic cancer Father   . Seizures Neg Hx     SOCIAL HISTORY: Social History   Social History  . Marital Status: Married    Spouse Name: Cam  . Number of Children: 2  . Years of Education: college   Occupational History  . [employer] Other   Social History Main Topics  . Smoking status: Former Smoker    Quit date: 09/29/2012  . Smokeless tobacco: Never Used  . Alcohol Use: 8.4 oz/week    14 Glasses of wine per week     Comment: 2-4 GLASSES OF WINE PER DAY  . Drug Use: No  . Sexual Activity: Not on file   Other Topics Concern  . Not on file   Social History Narrative   Patient drinks 2 cups of coffee daily.   Patient lives at home with his wife (Cam)   Retired.   Patient is left handed.      PHYSICAL EXAM  Filed Vitals:   09/23/15 1023  BP: 121/79  Pulse: 59  Height: 5\' 9"  (1.753 m)  Weight: 188 lb 8 oz (85.503 kg)   Body mass index is 27.82 kg/(m^2).   MMSE - Mini Mental State Exam 09/23/2015 05/22/2015 01/22/2015  Orientation to time 4 3 2   Orientation to Place 5 4 5   Registration 3 3 3   Attention/ Calculation 5 5 5   Recall 1 0 0  Language- name 2 objects 2 2 2   Language- repeat 1 1 1   Language- follow 3 step command 3 2 3   Language- read & follow direction 1 1 1   Write a sentence 1 1 1   Copy design 1 0 1  Total score 27 22 24      Generalized: Well developed, in no acute distress  Neck: Circumference 16-1/2 inches, Mallampati 1+  Neurological examination  Mentation: Alert oriented to time, place, history taking. Follows all commands speech and language fluent Cranial nerve II-XII: Pupils were equal round  reactive to light. Extraocular movements were full, visual field were full on confrontational test. Facial sensation and strength were normal. Uvula tongue midline. Head turning and shoulder shrug  were normal and symmetric. Motor: The motor testing reveals 5 over 5 strength of all 4 extremities. Good symmetric motor tone is noted throughout.  Sensory: Sensory testing is intact to soft touch on all 4 extremities. No evidence of extinction is noted.  Coordination: Cerebellar testing reveals good finger-nose-finger and heel-to-shin bilaterally.  Gait and station: Gait is normal. Tandem gait is normal. Romberg is negative. No drift is seen.  Reflexes: Deep tendon  reflexes are symmetric and normal bilaterally.   DIAGNOSTIC DATA (LABS, IMAGING, TESTING) - I reviewed patient records, labs, notes, testing and imaging myself where available.     ASSESSMENT AND PLAN 65 y.o. year old male  has a past medical history of Hypertension; COPD (chronic obstructive pulmonary disease) ([place]); Depression; RLS (restless legs syndrome); Gout; Prostate cancer ([place]); Diabetes ([place]); Memory disturbance (01/26/2013); and Convulsions/seizures ([place]) (07/05/2014). here with:  1. Memory disturbance 2. Nocturnal seizure events  The patient's memory has remained stable. He will continue on Namenda. The patient has had one nocturnal seizure event since the last visit. Unsure if this is related to his antidepressant. For now we will continue to monitor. If he continues to have episodes they will let us know and at that time we may have to consider antiepileptic medication. The patient does have witnessed apnea events and daytime sleepiness. I will refer the patient to one of our sleep physicians to be evaluated for possible apnea. Patient is amenable to this plan. He will follow-up in 6 months or sooner if needed.    Ward Givens, MSN, NP-C 09/23/2015, 10:43 AM [facility] [address] [place], [place] [address] [phone]

## 2015-09-25 ENCOUNTER — Encounter: Payer: Self-pay | Admitting: Neurology

## 2015-09-25 ENCOUNTER — Ambulatory Visit (INDEPENDENT_AMBULATORY_CARE_PROVIDER_SITE_OTHER): Payer: Medicare Other | Admitting: Neurology

## 2015-09-25 VITALS — BP 128/82 | HR 70 | Resp 16 | Ht 69.0 in | Wt 187.0 lb

## 2015-09-25 DIAGNOSIS — R0683 Snoring: Secondary | ICD-10-CM

## 2015-09-25 DIAGNOSIS — G4710 Hypersomnia, unspecified: Secondary | ICD-10-CM | POA: Diagnosis not present

## 2015-09-25 DIAGNOSIS — R569 Unspecified convulsions: Secondary | ICD-10-CM

## 2015-09-25 DIAGNOSIS — G4761 Periodic limb movement disorder: Secondary | ICD-10-CM | POA: Diagnosis not present

## 2015-09-25 DIAGNOSIS — R0681 Apnea, not elsewhere classified: Secondary | ICD-10-CM

## 2015-09-25 DIAGNOSIS — G40909 Epilepsy, unspecified, not intractable, without status epilepticus: Secondary | ICD-10-CM | POA: Diagnosis not present

## 2015-09-25 NOTE — Patient Instructions (Addendum)

## 2015-09-25 NOTE — Progress Notes (Signed)
Subjective:    Patient ID: Lucas Vega is a 65 y.o. male.  HPI     Interim history:   Dear Jinny Blossom and Lanny Hurst,   I saw your patient, Lucas Vega, upon your kind request in my clinic today for initial consultation of his sleep disturbance, concern for obstructive sleep apnea. The patient is accompanied by his wife today. As you know, Lucas Vega is a 65 year old left-handed gentleman with an underlying medical history of seizure disorder, hypertension, COPD, RLS, depression, gout, prostate cancer, diabetes and memory loss, who reports snoring and excessive daytime somnolence. Per wife, he has pauses in his breathing and wakes up with a loud sound of gasping or snored. He denies morning headaches and restless leg symptoms but has been noted to have twitching in his limbs and kicking in his sleep per wife. She has also noted a seizure-like event in his sleep, described as foaming at the mouth and erratic breathing with mild twitching, no significant convulsion recently but has had a convulsive event in sleep as well. At the time he was on Wellbutrin and was told to stop it. He had significant sleepiness on Zoloft which was also discontinued, now he is on low-dose Effexor long-acting. He still has significant daytime somnolence and takes extended naps if she lets them. Bedtime at night is around 10 PM but I do watch some TV sitting up in bed. He has nocturia usually once per night. He is not aware of any family history of OSA. Tonsils came out when he was in first grade. He lives with his wife. He is retired. He worked in Press [employer]. They have 2 adopted children. They have 2 cats and 1 dog. He tries to walk regularly. He has fallen. He fell a few days ago and scraped his left knee. Of note, he quit smoking 3 years ago, drinks caffeine in the form of coffee 1 cup in the morning and some crystallite tea during the day. Does not drink any plain water except for maybe 1 glass per day. He does drink alcohol  nightly, 2-3 drinks per night, usually Vodka.  His Epworth sleepiness score is 4 out of 24 today, his fatigue score is 20 out of 63. I reviewed your office note from 09/23/2015.  His Past Medical History Is Significant For: Past Medical History  Diagnosis Date  . Hypertension   . COPD (chronic obstructive pulmonary disease) ([place])   . Depression   . RLS (restless legs syndrome)   . Gout   . Prostate cancer ([place])   . Diabetes ([place])   . Memory disturbance 01/26/2013  . Convulsions/seizures ([place]) 07/05/2014    His Past Surgical History Is Significant For: Past Surgical History  Procedure Laterality Date  . Tonsillectomy and adenoidectomy    . Prostatectomy    . Colonoscopy  2009    JAN    His Family History Is Significant For: Family History  Problem Relation Age of Onset  . Dementia Mother   . Pancreatic cancer Father   . Seizures Neg Hx     His Social History Is Significant For: Social History   Social History  . Marital Status: Married    Spouse Name: Cam  . Number of Children: 2  . Years of Education: college   Occupational History  . [employer] Other   Social History Main Topics  . Smoking status: Former Smoker    Quit date: 09/29/2012  . Smokeless tobacco: Never Used  . Alcohol Use: Yes  .  Drug Use: No  . Sexual Activity: Not Asked   Other Topics Concern  . None   Social History Narrative   Patient drinks 2 cups of coffee daily.   Patient lives at home with his wife (Cam)   Retired.   Patient is left handed.   Drinks 1 cup of coffee a day     His Allergies Are:  No Known Allergies:   His Current Medications Are:  Outpatient Encounter Prescriptions as of 09/25/2015  Medication Sig  . allopurinol (ZYLOPRIM) 100 MG tablet Take 100 mg by mouth daily.  Marland Kitchen ALPRAZolam (XANAX) 0.25 MG tablet Take 0.25 mg by mouth at bedtime as needed for anxiety.  Marland Kitchen aspirin 81 MG tablet Take 81 mg by mouth daily.  Marland Kitchen atenolol-chlorthalidone (TENORETIC) 50-25 MG per  tablet Take 2 tablets by mouth daily.   . memantine (NAMENDA) 10 MG tablet Take 1 tablet (10 mg total) by mouth 2 (two) times daily.  . metFORMIN (GLUCOPHAGE) 500 MG tablet 2 (two) times daily with a meal.   . simvastatin (ZOCOR) 20 MG tablet Take 20 mg by mouth daily.  Marland Kitchen venlafaxine XR (EFFEXOR-XR) 37.5 MG 24 hr capsule    No facility-administered encounter medications on file as of 09/25/2015.  :  Review of Systems:  Out of a complete 14 point review of systems, all are reviewed and negative with the exception of these symptoms as listed below:   Review of Systems  Constitutional: Positive for fatigue.  Neurological:       Seizures at night.  Restless, snoring, witnessed apnea, patient sleeps almost everyday for 3-4 hours.   Psychiatric/Behavioral:       Anxiety and too much sleep    Epworth Sleepiness Scale 0= would never doze 1= slight chance of dozing 2= moderate chance of dozing 3= high chance of dozing  Sitting and reading:0 Watching TV:1 Sitting inactive in a public place (ex. Theater or meeting):0 As a passenger in a car for an hour without a break:0 Lying down to rest in the afternoon:3 Sitting and talking to someone:0 Sitting quietly after lunch (no alcohol):0 In a car, while stopped in traffic:0 Total:4   Objective:  Neurologic Exam  Physical Exam Physical Examination:   Filed Vitals:   09/25/15 1003  BP: 128/82  Pulse: 70  Resp: 16   General Examination: The patient is a very pleasant 65 y.o. male in no acute distress. He appears well-developed and well-nourished and well groomed.   HEENT: Normocephalic, atraumatic, pupils are equal, round and reactive to light and accommodation. Extraocular tracking is good without limitation to gaze excursion or nystagmus noted. Normal smooth pursuit is noted. Hearing is grossly intact. Mild cataracts, legally blind OS, wears eye glasses. Face is symmetric with normal facial animation and normal facial sensation. Speech  is clear with no dysarthria noted. There is no hypophonia. There is no lip, neck/head, jaw or voice tremor. Neck is supple with full range of passive and active motion. There are no carotid bruits on auscultation. Oropharynx exam reveals: moderate mouth dryness, adequate dental hygiene and moderate airway crowding, due to smaller airway. Mallampati is class II. Tongue protrudes centrally and palate elevates symmetrically. Tonsils are absent. Neck size is 16 3/8 inches. He has a Moderate overbite.   Chest: Clear to auscultation without wheezing, rhonchi or crackles noted.  Heart: S1+S2+0, regular and normal without murmurs, rubs or gallops noted.   Abdomen: Soft, non-tender and non-distended with normal bowel sounds appreciated on auscultation.  Extremities: There  is no pitting edema in the distal lower extremities bilaterally. Pedal pulses are intact.  Skin: Warm and dry without trophic changes noted. There are no varicose veins. Mild eschar L knee. Skin rather dry.  Musculoskeletal: exam reveals no obvious joint deformities, tenderness or joint swelling or erythema.   Neurologically:  Mental status: The patient is awake, alert and oriented in all 4 spheres. His immediate and remote memory, attention, language skills and fund of knowledge are mildly impaired. There is no evidence of aphasia, agnosia, apraxia or anomia. Speech is clear with normal prosody and enunciation. Thought process is linear. Mood is normal and affect is normal.  Cranial nerves II - XII are as described above under HEENT exam. In addition: shoulder shrug is normal with equal shoulder height noted. Motor exam: Normal bulk, strength and tone is noted. There is no drift, tremor or rebound. Romberg is negative. Reflexes are 2+ throughout. Fine motor skills and coordination: intact with normal finger taps, normal hand movements, normal rapid alternating patting, normal foot taps and normal foot agility.  Cerebellar testing: No  dysmetria or intention tremor on finger to nose testing. Heel to shin is unremarkable bilaterally. There is no truncal or gait ataxia.  Sensory exam: intact to light touch in the upper and lower extremities.  Gait, station and balance: He stands easily. No veering to one side is noted. No leaning to one side is noted. Posture is age-appropriate and stance is narrow based. Gait shows normal stride length and normal pace. No problems turning are noted. Tandem walk is difficult for him.     Assessment and Plan:   In summary, TAARIQ HARTLEY is a very pleasant 65 y.o.-year old male with an underlying medical history of seizure disorder, hypertension, COPD, RLS, depression, gout, prostate cancer, diabetes and memory loss, whose history and physical exam are indeed concerning for obstructive sleep apnea (OSA). He is reported to have nocturnal seizure-like events. We will be on the look out for abnormal EEG findings during the sleep study as well as PLMs.  I had a long chat with the patient and his wife about my findings and the diagnosis of OSA, its prognosis and treatment options. We talked about medical treatments, surgical interventions and non-pharmacological approaches. I explained in particular the risks and ramifications of untreated moderate to severe OSA, especially with respect to developing cardiovascular disease down the Road, including congestive heart failure, difficult to treat hypertension, cardiac arrhythmias, or stroke. Even type 2 diabetes has, in part, been linked to untreated OSA. Symptoms of untreated OSA include daytime sleepiness, memory problems, mood irritability and mood disorder such as depression and anxiety, lack of energy, as well as recurrent headaches, especially morning headaches. We talked about trying to maintain a healthy lifestyle in general, as well as the importance of weight control. I encouraged the patient to eat healthy, exercise daily and keep well hydrated, to keep a  scheduled bedtime and wake time routine, to not skip any meals and eat healthy snacks in between meals. I advised the patient not to drive when feeling sleepy. His wife will bring him for the sleep study and pick him up the next morning. He is advised to reduce/limit his alcohol intake as it can affect his balance and cognitive function. Furthermore, he is advised to increase his water intake. I recommended the following at this time: sleep study with potential positive airway pressure titration. (We will score hypopneas at 4% and split the sleep study into diagnostic and treatment  portion, if the estimated. 2 hour AHI is >15/h).   I explained the sleep test procedure to the patient and also outlined possible surgical and non-surgical treatment options of OSA, including the use of a custom-made dental device (which would require a referral to a specialist dentist or oral surgeon), upper airway surgical options, such as pillar implants, radiofrequency surgery, tongue base surgery, and UPPP (which would involve a referral to an ENT surgeon). Rarely, jaw surgery such as mandibular advancement may be considered.  I also explained the CPAP treatment option to the patient, who indicated that he would be willing to try CPAP if the need arises. I explained the importance of being compliant with PAP treatment, not only for insurance purposes but primarily to improve His symptoms, and for the patient's long term health benefit, including to reduce His cardiovascular risks. I answered all their questions today and the patient and his wife were in agreement. I would like to see him back after the sleep study is completed and encouraged him to call with any interim questions, concerns, problems or updates.   Thank you very much for allowing me to participate in the care of this nice patient. If I can be of any further assistance to you please do not hesitate to call me at [phone].  Sincerely,   Star Age, MD,  PhD

## 2015-10-08 ENCOUNTER — Ambulatory Visit (INDEPENDENT_AMBULATORY_CARE_PROVIDER_SITE_OTHER): Payer: Medicare Other | Admitting: Neurology

## 2015-10-08 DIAGNOSIS — G4733 Obstructive sleep apnea (adult) (pediatric): Secondary | ICD-10-CM

## 2015-10-08 DIAGNOSIS — G479 Sleep disorder, unspecified: Secondary | ICD-10-CM

## 2015-10-08 DIAGNOSIS — G4761 Periodic limb movement disorder: Secondary | ICD-10-CM

## 2015-10-08 DIAGNOSIS — G4720 Circadian rhythm sleep disorder, unspecified type: Secondary | ICD-10-CM

## 2015-10-08 DIAGNOSIS — G4734 Idiopathic sleep related nonobstructive alveolar hypoventilation: Secondary | ICD-10-CM

## 2015-10-14 ENCOUNTER — Telehealth: Payer: Self-pay | Admitting: Neurology

## 2015-10-14 DIAGNOSIS — G4733 Obstructive sleep apnea (adult) (pediatric): Secondary | ICD-10-CM

## 2015-10-14 DIAGNOSIS — G4734 Idiopathic sleep related nonobstructive alveolar hypoventilation: Secondary | ICD-10-CM

## 2015-10-14 DIAGNOSIS — G4761 Periodic limb movement disorder: Secondary | ICD-10-CM

## 2015-10-14 NOTE — Telephone Encounter (Signed)
Patient referred by Dr. Jannifer Franklin and Jinny Blossom, seen by me on 09/25/15, diagnostic PSG on 10/08/15.   Please call and notify the patient that the recent sleep study did confirm the diagnosis of obstructive sleep apnea and that I recommend treatment for this in the form of CPAP. This will require a repeat sleep study for proper titration and mask fitting. Please explain to patient and arrange for a CPAP titration study. I have placed an order in the chart. Thanks, and please route to [facility] for scheduling next sleep study.  Star Age, MD, PhD [facility])

## 2015-10-15 NOTE — Telephone Encounter (Signed)
I called and spoke to wife (ok per DPR) and gave results and recommendations. She voiced understanding and stated that she will discuss with patient and call Dawn back to schedule titration study. I have sent report to PCP.

## 2015-10-28 ENCOUNTER — Ambulatory Visit (INDEPENDENT_AMBULATORY_CARE_PROVIDER_SITE_OTHER): Payer: Medicare Other | Admitting: Neurology

## 2015-10-28 DIAGNOSIS — G479 Sleep disorder, unspecified: Secondary | ICD-10-CM

## 2015-10-28 DIAGNOSIS — G4761 Periodic limb movement disorder: Secondary | ICD-10-CM

## 2015-10-28 DIAGNOSIS — G4733 Obstructive sleep apnea (adult) (pediatric): Secondary | ICD-10-CM | POA: Diagnosis not present

## 2015-10-28 DIAGNOSIS — G4720 Circadian rhythm sleep disorder, unspecified type: Secondary | ICD-10-CM

## 2015-11-04 ENCOUNTER — Telehealth: Payer: Self-pay | Admitting: Neurology

## 2015-11-04 DIAGNOSIS — G4733 Obstructive sleep apnea (adult) (pediatric): Secondary | ICD-10-CM

## 2015-11-04 NOTE — Telephone Encounter (Signed)
Patient referred by Dr. Jannifer Franklin and Jinny Blossom, seen by me on 09/25/15, diagnostic PSG on 10/08/15, cpap study on 10/28/15. Please call and inform patient that I have entered an order for treatment with positive airway pressure (PAP) treatment of obstructive sleep apnea (OSA). He did fairly well during the latest sleep study with CPAP. We will, therefore, arrange for a machine for home use through a DME (durable medical equipment) company of His choice; and I will see the patient back in follow-up in about 8-10 weeks. Please also explain to the patient that I will be looking out for compliance data, which can be downloaded from the machine (stored on an SD card, that is inserted in the machine) or via remote access through a modem, that is built into the machine. At the time of the followup appointment we will discuss sleep study results and how it is going with PAP treatment at home. Please advise patient to bring His machine at the time of the first FU visit, even though this is cumbersome. Bringing the machine for every visit after that will likely not be needed, but often helps for the first visit to troubleshoot if needed. Please re-enforce the importance of compliance with treatment and the need for [place] to monitor compliance data - often an insurance requirement and actually good feedback for the patient as far as how they are doing.  Also remind patient, that any interim PAP machine or mask issues should be first addressed with the DME company, as they can often help better with technical and mask fit issues. Please ask if patient has a preference regarding DME company.  Please also make sure, the patient has a follow-up appointment with me in about 8-10 weeks from the setup date, thanks.  Once you have spoken to the patient - and faxed/routed report to PCP and referring MD (if other than PCP), you can close this encounter, thanks,   Star Age, MD, PhD [facility] ([place])

## 2015-11-06 NOTE — Telephone Encounter (Signed)
I called and spoke to wife. She is aware of results and recommendations. She is willing to proceed with treatment. I will send orders to [facility]. I will send report to PCP. Patient will also receive a letter reminding him to make f/u appt with Dr. Rexene Alberts and stress the importance of compliance.

## 2015-11-25 ENCOUNTER — Ambulatory Visit: Payer: Medicare Other | Admitting: Adult Health

## 2015-11-26 DIAGNOSIS — G309 Alzheimer's disease, unspecified: Secondary | ICD-10-CM | POA: Diagnosis not present

## 2015-11-26 DIAGNOSIS — Z7984 Long term (current) use of oral hypoglycemic drugs: Secondary | ICD-10-CM | POA: Diagnosis not present

## 2015-11-26 DIAGNOSIS — N182 Chronic kidney disease, stage 2 (mild): Secondary | ICD-10-CM | POA: Diagnosis not present

## 2015-11-26 DIAGNOSIS — Z23 Encounter for immunization: Secondary | ICD-10-CM | POA: Diagnosis not present

## 2015-11-26 DIAGNOSIS — E1129 Type 2 diabetes mellitus with other diabetic kidney complication: Secondary | ICD-10-CM | POA: Diagnosis not present

## 2015-11-26 DIAGNOSIS — E1165 Type 2 diabetes mellitus with hyperglycemia: Secondary | ICD-10-CM | POA: Diagnosis not present

## 2015-11-26 DIAGNOSIS — I129 Hypertensive chronic kidney disease with stage 1 through stage 4 chronic kidney disease, or unspecified chronic kidney disease: Secondary | ICD-10-CM | POA: Diagnosis not present

## 2015-11-26 DIAGNOSIS — E7800 Pure hypercholesterolemia, unspecified: Secondary | ICD-10-CM | POA: Diagnosis not present

## 2015-11-26 DIAGNOSIS — F322 Major depressive disorder, single episode, severe without psychotic features: Secondary | ICD-10-CM | POA: Diagnosis not present

## 2015-12-18 ENCOUNTER — Encounter: Payer: Self-pay | Admitting: Neurology

## 2015-12-26 DIAGNOSIS — E876 Hypokalemia: Secondary | ICD-10-CM | POA: Diagnosis not present

## 2016-01-06 DIAGNOSIS — E876 Hypokalemia: Secondary | ICD-10-CM | POA: Diagnosis not present

## 2016-01-20 ENCOUNTER — Ambulatory Visit: Payer: Medicare Other | Admitting: Neurology

## 2016-01-21 DIAGNOSIS — E876 Hypokalemia: Secondary | ICD-10-CM | POA: Diagnosis not present

## 2016-02-17 DIAGNOSIS — I129 Hypertensive chronic kidney disease with stage 1 through stage 4 chronic kidney disease, or unspecified chronic kidney disease: Secondary | ICD-10-CM | POA: Diagnosis not present

## 2016-02-17 DIAGNOSIS — R569 Unspecified convulsions: Secondary | ICD-10-CM | POA: Diagnosis not present

## 2016-02-17 DIAGNOSIS — E1129 Type 2 diabetes mellitus with other diabetic kidney complication: Secondary | ICD-10-CM | POA: Diagnosis not present

## 2016-02-17 DIAGNOSIS — Z8546 Personal history of malignant neoplasm of prostate: Secondary | ICD-10-CM | POA: Diagnosis not present

## 2016-02-17 DIAGNOSIS — E7800 Pure hypercholesterolemia, unspecified: Secondary | ICD-10-CM | POA: Diagnosis not present

## 2016-02-17 DIAGNOSIS — N182 Chronic kidney disease, stage 2 (mild): Secondary | ICD-10-CM | POA: Diagnosis not present

## 2016-02-17 DIAGNOSIS — N529 Male erectile dysfunction, unspecified: Secondary | ICD-10-CM | POA: Diagnosis not present

## 2016-02-17 DIAGNOSIS — F411 Generalized anxiety disorder: Secondary | ICD-10-CM | POA: Diagnosis not present

## 2016-02-17 DIAGNOSIS — F322 Major depressive disorder, single episode, severe without psychotic features: Secondary | ICD-10-CM | POA: Diagnosis not present

## 2016-02-17 DIAGNOSIS — Z7984 Long term (current) use of oral hypoglycemic drugs: Secondary | ICD-10-CM | POA: Diagnosis not present

## 2016-02-17 DIAGNOSIS — Z0000 Encounter for general adult medical examination without abnormal findings: Secondary | ICD-10-CM | POA: Diagnosis not present

## 2016-02-17 DIAGNOSIS — G309 Alzheimer's disease, unspecified: Secondary | ICD-10-CM | POA: Diagnosis not present

## 2016-03-13 DIAGNOSIS — N183 Chronic kidney disease, stage 3 (moderate): Secondary | ICD-10-CM | POA: Diagnosis not present

## 2016-03-13 DIAGNOSIS — N179 Acute kidney failure, unspecified: Secondary | ICD-10-CM | POA: Diagnosis not present

## 2016-03-25 ENCOUNTER — Ambulatory Visit: Payer: Medicare Other | Admitting: Neurology

## 2016-04-07 ENCOUNTER — Encounter: Payer: Self-pay | Admitting: Neurology

## 2016-04-07 ENCOUNTER — Ambulatory Visit (INDEPENDENT_AMBULATORY_CARE_PROVIDER_SITE_OTHER): Payer: Medicare Other | Admitting: Neurology

## 2016-04-07 VITALS — BP 135/87 | HR 64 | Ht 69.0 in | Wt 187.5 lb

## 2016-04-07 DIAGNOSIS — R569 Unspecified convulsions: Secondary | ICD-10-CM

## 2016-04-07 DIAGNOSIS — R413 Other amnesia: Secondary | ICD-10-CM

## 2016-04-07 MED ORDER — LEVETIRACETAM 250 MG PO TABS
ORAL_TABLET | ORAL | 5 refills | Status: DC
Start: 2016-04-07 — End: 2016-10-12

## 2016-04-07 NOTE — Progress Notes (Signed)
Reason for visit: Seizures  Lucas Vega is an 66 y.o. male  History of present illness:  Lucas Vega is a 66 year old left-handed white male with a history of nocturnal seizures, and a history of a memory disturbance. The patient also has been diagnosed with sleep apnea, but he has not been able to tolerate CPAP and he is not on this treatment. The patient has had several more seizures. The patient has had 2 episodes in 2017, the last event occurred in December. All seizures have occurred at night while sleeping, usually early in the morning. The patient is not on any medication for seizures at this time. The patient takes Namenda for memory. The memory has been relatively stable according to the wife. The patient returns for an evaluation. The patient still operates a motor vehicle short distances.  Past Medical History:  Diagnosis Date  . Convulsions/seizures ([place]) 07/05/2014  . COPD (chronic obstructive pulmonary disease) ([place])   . Depression   . Diabetes ([place])   . Gout   . Hypertension   . Memory disturbance 01/26/2013  . Prostate cancer ([place])   . RLS (restless legs syndrome)     Past Surgical History:  Procedure Laterality Date  . COLONOSCOPY  2009   JAN  . PROSTATECTOMY    . TONSILLECTOMY AND ADENOIDECTOMY      Family History  Problem Relation Age of Onset  . Dementia Mother   . Pancreatic cancer Father   . Seizures Neg Hx     Social history:  reports that he quit smoking about 3 years ago. He has never used smokeless tobacco. He reports that he drinks alcohol. He reports that he does not use drugs.   No Known Allergies  Medications:  Prior to Admission medications   Medication Sig Start Date End Date Taking? Authorizing Provider  allopurinol (ZYLOPRIM) 100 MG tablet Take 100 mg by mouth daily. 05/11/13  Yes Historical Provider, MD  ALPRAZolam Duanne Moron) 0.25 MG tablet Take 0.25 mg by mouth 2 (two) times daily as needed for anxiety.    Yes Historical Provider,  MD  aspirin 81 MG tablet Take 81 mg by mouth daily.   Yes Historical Provider, MD  atenolol-chlorthalidone (TENORETIC) 50-25 MG per tablet Take 2 tablets by mouth daily.    Yes Historical Provider, MD  haloperidol (HALDOL) 0.5 MG tablet  02/17/16  Yes Historical Provider, MD  KLOR-CON M20 20 MEQ tablet  02/18/16  Yes Historical Provider, MD  memantine (NAMENDA) 10 MG tablet Take 1 tablet (10 mg total) by mouth 2 (two) times daily. 08/29/14  Yes Ward Givens, NP  metFORMIN (GLUCOPHAGE) 500 MG tablet 2 (two) times daily with a meal.  09/02/15  Yes Historical Provider, MD  simvastatin (ZOCOR) 20 MG tablet Take 20 mg by mouth daily.   Yes Historical Provider, MD  venlafaxine XR (EFFEXOR-XR) 75 MG 24 hr capsule  03/06/16  Yes Historical Provider, MD  levETIRAcetam (KEPPRA) 250 MG tablet One tablet in the morning and two in the evening 04/07/16   Kathrynn Ducking, MD    ROS:  Out of a complete 14 system review of symptoms, the patient complains only of the following symptoms, and all other reviewed systems are negative.  Snoring Memory loss, seizures Confusion, depression, anxiety  Blood pressure 135/87, pulse 64, height 5\' 9"  (1.753 m), weight 187 lb 8 oz (85 kg).  Physical Exam  General: The patient is alert and cooperative at the time of the examination.  Skin: No  significant peripheral edema is noted.   Neurologic Exam  Mental status: The patient is alert and oriented x 3 at the time of the examination. The Mini-Mental Status Examination done today shows a total score 25/30.   Cranial nerves: Facial symmetry is present. Speech is normal, no aphasia or dysarthria is noted. Extraocular movements are full. Visual fields are full.  Motor: The patient has good strength in all 4 extremities.  Sensory examination: Soft touch sensation is symmetric on the face, arms, and legs.  Coordination: The patient has good finger-nose-finger and heel-to-shin bilaterally.  Gait and station: The  patient has a normal gait. Tandem gait is normal. Romberg is negative. No drift is seen.  Reflexes: Deep tendon reflexes are symmetric.   Assessment/Plan:  1. Nocturnal seizures with recent recurrence  2. Memory disturbance  3. Sleep apnea not on CPAP  The patient will need to go on a medication for seizures at this time. I will start low-dose Keppra. He has trouble with tolerance of the medication, he is to contact our office. The patient will remain on Namenda. He will follow-up in 6 months. He is not to operate a motor vehicle for 6 months following the last seizure event.  Lucas Alexanders MD 04/07/2016 12:36 PM  [facility] [address] [place], [place] [address]  Phone [phone] Fax [phone]

## 2016-04-07 NOTE — Patient Instructions (Signed)
With the Keppra 250 mg tablet, begin one twice a day for 2 weeks, then take one in the morning and two in the evening.   Watch out for drowsiness and irritability.

## 2016-06-24 DIAGNOSIS — E1129 Type 2 diabetes mellitus with other diabetic kidney complication: Secondary | ICD-10-CM | POA: Diagnosis not present

## 2016-06-24 DIAGNOSIS — R319 Hematuria, unspecified: Secondary | ICD-10-CM | POA: Diagnosis not present

## 2016-06-24 DIAGNOSIS — G4733 Obstructive sleep apnea (adult) (pediatric): Secondary | ICD-10-CM | POA: Diagnosis not present

## 2016-06-24 DIAGNOSIS — I129 Hypertensive chronic kidney disease with stage 1 through stage 4 chronic kidney disease, or unspecified chronic kidney disease: Secondary | ICD-10-CM | POA: Diagnosis not present

## 2016-06-24 DIAGNOSIS — F05 Delirium due to known physiological condition: Secondary | ICD-10-CM | POA: Diagnosis not present

## 2016-06-24 DIAGNOSIS — R569 Unspecified convulsions: Secondary | ICD-10-CM | POA: Diagnosis not present

## 2016-06-24 DIAGNOSIS — G309 Alzheimer's disease, unspecified: Secondary | ICD-10-CM | POA: Diagnosis not present

## 2016-06-24 DIAGNOSIS — N182 Chronic kidney disease, stage 2 (mild): Secondary | ICD-10-CM | POA: Diagnosis not present

## 2016-06-24 DIAGNOSIS — E7800 Pure hypercholesterolemia, unspecified: Secondary | ICD-10-CM | POA: Diagnosis not present

## 2016-06-24 DIAGNOSIS — F322 Major depressive disorder, single episode, severe without psychotic features: Secondary | ICD-10-CM | POA: Diagnosis not present

## 2016-07-13 DIAGNOSIS — R319 Hematuria, unspecified: Secondary | ICD-10-CM | POA: Diagnosis not present

## 2016-10-12 ENCOUNTER — Encounter: Payer: Self-pay | Admitting: Adult Health

## 2016-10-12 ENCOUNTER — Ambulatory Visit (INDEPENDENT_AMBULATORY_CARE_PROVIDER_SITE_OTHER): Payer: Medicare Other | Admitting: Adult Health

## 2016-10-12 VITALS — BP 113/77 | HR 57 | Wt 178.0 lb

## 2016-10-12 DIAGNOSIS — R413 Other amnesia: Secondary | ICD-10-CM | POA: Diagnosis not present

## 2016-10-12 DIAGNOSIS — G40909 Epilepsy, unspecified, not intractable, without status epilepticus: Secondary | ICD-10-CM

## 2016-10-12 DIAGNOSIS — R569 Unspecified convulsions: Secondary | ICD-10-CM

## 2016-10-12 MED ORDER — LACOSAMIDE 50 MG PO TABS: 50.0000 mg | ORAL_TABLET | Freq: Two times a day (BID) | ORAL | 5 refills | Status: DC

## 2016-10-12 NOTE — Progress Notes (Addendum)
PATIENT: SULLY DYMENT DOB: May 02, 1950  REASON FOR VISIT: follow up- nocturnal seizures HISTORY FROM: patient  HISTORY OF PRESENT ILLNESS: Today 10/12/16 Mr. Wintle is a 66 year old male with a history of nocturnal seizures and memory disturbance. He returns today for follow-up. He is here with his wife today. They did not notice any changes with his memory. He continues on Namenda. He lives at home with his wife. He is able to complete all ADLs independently. He rarely operates a Teacher, music. His wife states occasionally he may drive to [facility] to pick up a prescription. His wife states that she believes he's had one nocturnal seizure since the last visit. She states that she noticed blood on his pillow and he reported that his tongue was sore. The patient is taking Keppra however is unable to tolerate the medication twice a day. His wife reports that when he was taking it twice a day he was very mean and irritable. Wife also states that he drinks a significant amount of alcohol at night. She reports that last night he had 2 cups of vodka, 2 glasses of wine and a beer. She is concerned that he is drinking in excess. He returns today for an evaluation.  HISTORY 04/07/16: Mr. Duesing is a 66 year old left-handed white male with a history of nocturnal seizures, and a history of a memory disturbance. The patient also has been diagnosed with sleep apnea, but he has not been able to tolerate CPAP and he is not on this treatment. The patient has had several more seizures. The patient has had 2 episodes in 2017, the last event occurred in December. All seizures have occurred at night while sleeping, usually early in the morning. The patient is not on any medication for seizures at this time. The patient takes Namenda for memory. The memory has been relatively stable according to the wife. The patient returns for an evaluation. The patient still operates a motor vehicle short distances.   REVIEW OF  SYSTEMS: Out of a complete 14 system review of symptoms, the patient complains only of the following symptoms, and all other reviewed systems are negative.  Appetite change, incontinence of bladder, memory loss, headache, seizure, weakness, agitation, confusion, nervous/anxious, daytime sleepiness, acting out dreams, appetite change  ALLERGIES: No Known Allergies  HOME MEDICATIONS: Outpatient Medications Prior to Visit  Medication Sig Dispense Refill  . allopurinol (ZYLOPRIM) 100 MG tablet Take 100 mg by mouth daily.    Marland Kitchen ALPRAZolam (XANAX) 0.25 MG tablet Take 0.25 mg by mouth 2 (two) times daily as needed for anxiety.     Marland Kitchen aspirin 81 MG tablet Take 81 mg by mouth daily.    Marland Kitchen atenolol-chlorthalidone (TENORETIC) 50-25 MG per tablet Take 2 tablets by mouth daily.     Marland Kitchen KLOR-CON M20 20 MEQ tablet     . levETIRAcetam (KEPPRA) 250 MG tablet One tablet in the morning and two in the evening 90 tablet 5  . memantine (NAMENDA) 10 MG tablet Take 1 tablet (10 mg total) by mouth 2 (two) times daily. 60 tablet 5  . metFORMIN (GLUCOPHAGE) 500 MG tablet 2 (two) times daily with a meal.     . simvastatin (ZOCOR) 20 MG tablet Take 20 mg by mouth daily.    Marland Kitchen venlafaxine XR (EFFEXOR-XR) 75 MG 24 hr capsule 37.5 mg.     . haloperidol (HALDOL) 0.5 MG tablet      No facility-administered medications prior to visit.     PAST MEDICAL HISTORY:  Past Medical History:  Diagnosis Date  . Convulsions/seizures ([place]) 07/05/2014  . COPD (chronic obstructive pulmonary disease) ([place])   . Depression   . Diabetes ([place])   . Gout   . Hypertension   . Memory disturbance 01/26/2013  . Prostate cancer ([place])   . RLS (restless legs syndrome)     PAST SURGICAL HISTORY: Past Surgical History:  Procedure Laterality Date  . COLONOSCOPY  2009   JAN  . PROSTATECTOMY    . TONSILLECTOMY AND ADENOIDECTOMY      FAMILY HISTORY: Family History  Problem Relation Age of Onset  . Dementia Mother   . Pancreatic cancer  Father   . Seizures Neg Hx     SOCIAL HISTORY: Social History   Social History  . Marital status: Married    Spouse name: Cam  . Number of children: 2  . Years of education: college   Occupational History  . [employer] Other   Social History Main Topics  . Smoking status: Former Smoker    Quit date: 09/29/2012  . Smokeless tobacco: Never Used  . Alcohol use Yes  . Drug use: No  . Sexual activity: Not on file   Other Topics Concern  . Not on file   Social History Narrative   Patient drinks 2 cups of coffee daily.   Patient lives at home with his wife (Cam)   Retired.   Patient is left handed.   Drinks 1 cup of coffee a day       PHYSICAL EXAM  Vitals:   10/12/16 1356  BP: 113/77  Pulse: (!) 57  Weight: 178 lb (80.7 kg)   Body mass index is 26.29 kg/m.   MMSE - Mini Mental State Exam 10/12/2016 04/07/2016 09/23/2015  Orientation to time 5 3 4   Orientation to Place 5 5 5   Registration 3 3 3   Attention/ Calculation 5 5 5   Recall 0 1 1  Language- name 2 objects 2 2 2   Language- repeat 1 1 1   Language- follow 3 step command 3 2 3   Language- read & follow direction 1 1 1   Write a sentence 1 1 1   Copy design 1 1 1   Total score 27 25 27      Generalized: Well developed, in no acute distress   Neurological examination  Mentation: Alert oriented to time, place, history taking. Follows all commands speech and language fluent Cranial nerve II-XII: Pupils were equal round reactive to light. Extraocular movements were full, visual field were full on confrontational test. Facial sensation and strength were normal. Uvula tongue midline. Head turning and shoulder shrug  were normal and symmetric. Motor: The motor testing reveals 5 over 5 strength of all 4 extremities. Good symmetric motor tone is noted throughout.  Sensory: Sensory testing is intact to soft touch on all 4 extremities. No evidence of extinction is noted.  Coordination: Cerebellar testing reveals good  finger-nose-finger and heel-to-shin bilaterally.  Gait and station: Gait is normal. Tandem gait is normal. Romberg is negative. No drift is seen.  Reflexes: Deep tendon reflexes are symmetric and normal bilaterally.   DIAGNOSTIC DATA (LABS, IMAGING, TESTING) - I reviewed patient records, labs, notes, testing and imaging myself where available.     ASSESSMENT AND PLAN 66 y.o. year old male  has a past medical history of Convulsions/seizures ([place]) (07/05/2014); COPD (chronic obstructive pulmonary disease) ([place]); Depression; Diabetes ([place]); Gout; Hypertension; Memory disturbance (01/26/2013); Prostate cancer ([place]); and RLS (restless legs syndrome). here with:  1. Memory  disturbance 2. Seizures  The patient's memory score has remained stable. He will continue on Namenda. He has not been unable to tolerate high doses of Keppra. We will try switching the patient over to Vimpat 50 mg twice a day. He will continue on Keppra until he is able to get the Vimpat. Most likely this will require a prior authorization. I have reviewed the side effects of Vimpat with the patient and his wife. I've also advised the patient that he should limit his alcohol intake. He should slowly start to wean himself off of alcohol. I advised that if he has trouble doing this he should seek help. Patient voices understanding. He will follow-up in 6 months or sooner if needed.    Ward Givens, MSN, NP-C 10/12/2016, 2:15 PM [facility] [address], [place], [place] [address] [phone]

## 2016-10-12 NOTE — Patient Instructions (Addendum)
Memory score is stable Start Vimpat for seizures 50 mg twice a day  Continue Keppra 250 at bedtime until you are able to start Vimpat If your symptoms worsen or you develop new symptoms please let us know.   Lacosamide tablets What is this medicine? LACOSAMIDE (la KOE sa mide) is used to control seizures caused by certain types of epilepsy. This medicine may be used for other purposes; ask your health care provider or pharmacist if you have questions. COMMON BRAND NAME(S): Vimpat What should I tell my health care provider before I take this medicine? They need to know if you have any of these conditions: -dehydration -heart disease, including heart failure -history of a drug or alcohol abuse problem -kidney disease -liver disease -suicidal thoughts, plans, or attempt; a previous suicide attempt by you or a family member -an unusual or allergic reaction to lacosamide, other medicines, foods, dyes, or preservatives -pregnant or trying to get pregnant -breast-feeding How should I use this medicine? Take this medicine by mouth with a glass of water. You can take it with or without food. Follow the directions on the prescription label. Take your doses at regular intervals. Do not take your medicine more often than directed. Do not stop taking except on the advice of your doctor or health care professional. A special MedGuide will be given to you by the pharmacist with each prescription and refill. Be sure to read this information carefully each time. Talk to your pediatrician regarding the use of this medicine in children. While this drug may be prescribed for children as young as 8 years of age for selected conditions, precautions do apply. Overdosage: If you think you have taken too much of this medicine contact a poison control center or emergency room at once. NOTE: This medicine is only for you. Do not share this medicine with others. What if I miss a dose? If you miss a dose, take it as  soon as you can. If it is almost time for your next dose, take only that dose. Do not take double or extra doses. What may interact with this medicine? -atazanavir -beta-blockers like metoprolol and propranolol -calcium channel blockers like diltiazem and verapamil -digoxin -dronedarone -lopinavir/ritonavir This list may not describe all possible interactions. Give your health care provider a list of all the medicines, herbs, non-prescription drugs, or dietary supplements you use. Also tell them if you smoke, drink alcohol, or use illegal drugs. Some items may interact with your medicine. What should I watch for while using this medicine? Visit your doctor or health care professional for regular checks on your progress. This medicine needs careful monitoring. Wear a medical ID bracelet or chain, and carry a card that describes your disease and details of your medicine and dosage times. You may get drowsy or dizzy. Do not drive, use machinery, or do anything that needs mental alertness until you know how this medicine affects you. Do not stand or sit up quickly, especially if you are an older patient. This reduces the risk of dizzy or fainting spells. Alcohol may interfere with the effect of this medicine. Avoid alcoholic drinks. The use of this medicine may increase the chance of suicidal thoughts or actions. Pay special attention to how you are responding while on this medicine. Any worsening of mood, or thoughts of suicide or dying should be reported to your health care professional right away. Women who become pregnant while using this medicine may enroll in the [place] Pregnancy Registry  by calling [phone]. This registry collects information about the safety of antiepileptic drug use during pregnancy. What side effects may I notice from receiving this medicine? Side effects that you should report to your doctor or health care professional as soon as  possible: -allergic reactions like skin rash, itching or hives, swelling of the face, lips, or tongue -confusion -feeling faint or lightheaded, falls -fever -irregular heart beat -loss of memory -suicidal thoughts or other mood changes -unusually weak or tired -yellowing of the eyes, skin Side effects that usually do not require medical attention (report to your doctor or health care professional if they continue or are bothersome): -constipation -diarrhea -drowsiness -dry mouth -headache -nausea This list may not describe all possible side effects. Call your doctor for medical advice about side effects. You may report side effects to FDA at 1-800-FDA-1088. Where should I keep my medicine? Keep out of the reach of children. This medicine can be abused. Keep your medicine in a safe place to protect it from theft. Do not share this medicine with anyone. Selling or giving away this medicine is dangerous and against the law. This medicine may cause accidental overdose and death if it taken by other adults, children, or pets. Mix any unused medicine with a substance like cat litter or coffee grounds. Then throw the medicine away in a sealed container like a sealed bag or a coffee can with a lid. Do not use the medicine after the expiration date. Store at room temperature between 15 and 30 degrees C (59 and 86 degrees F). NOTE: This sheet is a summary. It may not cover all possible information. If you have questions about this medicine, talk to your doctor, pharmacist, or health care provider.  2018 Elsevier/Gold Standard (2016-01-16 08:32:52)

## 2016-10-12 NOTE — Progress Notes (Signed)
I have read the note, and I agree with the clinical assessment and plan.  Maikayla Beggs KEITH   

## 2016-10-13 ENCOUNTER — Telehealth: Payer: Self-pay | Admitting: *Deleted

## 2016-10-13 NOTE — Telephone Encounter (Signed)
Rx lacosamide was faxed 10/12/16 to pt pharmacy. Fax: [phone]. Received confirmation.

## 2016-10-15 ENCOUNTER — Telehealth: Payer: Self-pay | Admitting: Adult Health

## 2016-10-15 NOTE — Telephone Encounter (Signed)
Pt wife calling FH:LKTGYBWLSL (VIMPAT) 50 MG TABS tablet she said she spoke with [facility] and was told this medication would be $429.00, pt wife said she will keep pt on Keppra. The cost difference being one thing but also the potential side effects such as mood changes and memory issues.  Pt states she would like to also speak with NP [facility] about this, please call.

## 2016-10-15 NOTE — Telephone Encounter (Signed)
I called the patient and spoke to his wife. She states that insurance is not covering a lot of the Vimpat. She states that she prefers that he just stay on Keppra 250 mg daily. She feels that this is working. she states that since the last visit he only had 1 episode however she is unsure if it was actually a seizure event as it did not wake her up like he usually does. I am amenable to this plan. I did advise that if he does begin to have more seizures they should let us know. The patient is drinking heavily before bedtime. I again reiterated to the wife that if possible alcohol should be eliminated. The wife is concerned that this may be an addiction. I advised that they can discuss with primary care about potential resources to help him with this. Wife voiced understanding.

## 2016-10-15 NOTE — Telephone Encounter (Signed)
[facility] stated Rx lacosamide (Vimpat) no need for PA because is covered...insurance would not pay for the whole price  or can do other alternative medication.

## 2016-10-15 NOTE — Addendum Note (Signed)
Addended by: Trudie Buckler on: 10/15/2016 04:47 PM   Modules accepted: Orders

## 2016-11-04 DIAGNOSIS — F411 Generalized anxiety disorder: Secondary | ICD-10-CM | POA: Diagnosis not present

## 2016-11-04 DIAGNOSIS — I129 Hypertensive chronic kidney disease with stage 1 through stage 4 chronic kidney disease, or unspecified chronic kidney disease: Secondary | ICD-10-CM | POA: Diagnosis not present

## 2016-11-04 DIAGNOSIS — F322 Major depressive disorder, single episode, severe without psychotic features: Secondary | ICD-10-CM | POA: Diagnosis not present

## 2016-11-04 DIAGNOSIS — E7800 Pure hypercholesterolemia, unspecified: Secondary | ICD-10-CM | POA: Diagnosis not present

## 2016-11-04 DIAGNOSIS — N182 Chronic kidney disease, stage 2 (mild): Secondary | ICD-10-CM | POA: Diagnosis not present

## 2016-11-04 DIAGNOSIS — G309 Alzheimer's disease, unspecified: Secondary | ICD-10-CM | POA: Diagnosis not present

## 2016-11-04 DIAGNOSIS — Z7984 Long term (current) use of oral hypoglycemic drugs: Secondary | ICD-10-CM | POA: Diagnosis not present

## 2016-11-04 DIAGNOSIS — E1129 Type 2 diabetes mellitus with other diabetic kidney complication: Secondary | ICD-10-CM | POA: Diagnosis not present

## 2016-11-04 DIAGNOSIS — R569 Unspecified convulsions: Secondary | ICD-10-CM | POA: Diagnosis not present

## 2016-11-13 DIAGNOSIS — E876 Hypokalemia: Secondary | ICD-10-CM | POA: Diagnosis not present

## 2016-11-24 DIAGNOSIS — E876 Hypokalemia: Secondary | ICD-10-CM | POA: Diagnosis not present

## 2016-12-02 DIAGNOSIS — E876 Hypokalemia: Secondary | ICD-10-CM | POA: Diagnosis not present

## 2016-12-13 DIAGNOSIS — Z23 Encounter for immunization: Secondary | ICD-10-CM | POA: Diagnosis not present

## 2017-03-04 ENCOUNTER — Telehealth: Payer: Self-pay | Admitting: Neurology

## 2017-03-04 MED ORDER — VENLAFAXINE HCL ER 75 MG PO CP24: 75.0000 mg | ORAL_CAPSULE | Freq: Every day | ORAL | 5 refills | Status: DC

## 2017-03-04 NOTE — Telephone Encounter (Signed)
I called the patient.  I talk with the wife.  The patient has had increasing problems with irritability and agitation in the evening hours, he may have panic attacks in the morning.  He is on low-dose Effexor, we will double the dose to 75 mg a day, I will call in a prescription for him.

## 2017-03-04 NOTE — Telephone Encounter (Signed)
Pts wife called wanting to speak with Jinny Blossom or Jannifer Franklin personally concerning her husbands alzheimer. Didn't want to explain any deeper and does not want to speak with RNmi

## 2017-03-10 ENCOUNTER — Telehealth: Payer: Self-pay | Admitting: Neurology

## 2017-03-10 NOTE — Telephone Encounter (Signed)
Called and spoke with Caryl Pina at [facility] on [address]. Verified they received rx sent by [facility] for venlafaxine XR 75mg  capsule. Cash price, 9 dollars. They need pt to bring updated insurance card to place on file and run via insurance. Advised I will call wife back and relay the message.

## 2017-03-10 NOTE — Telephone Encounter (Signed)
Pts wife is calling saying that she spoke with Dr Jannifer Franklin on 03/04/17 her husbands medication was supposed to raised to 75MG  venlafaxine XR (EFFEXOR XR) 75 MG 24 hr capsule but the pharmacy was only able to give her the 35.5MG . Pts wife said she can double up the medication they have now but will need a refill soon with the right doses. Sent to [place], [place].

## 2017-03-10 NOTE — Telephone Encounter (Signed)
Called and spoke with wife. Relayed information below. She verbalized understanding and appreciation for call. She stated she picked up rx venlafaxine xr 37.5 mg capsule either yesterday or day before. They did not give her the increased dose.

## 2017-03-25 NOTE — Telephone Encounter (Signed)
Called [facility]. They stated since pt has refills on venlafaxine 75mg  capsule, pt will need to call pharmacy they want rx to go to and have them transfer prescription.   I called wife back and LVM relaying above information. Asked her to call back if she has any further questions/concerns.

## 2017-03-25 NOTE — Telephone Encounter (Signed)
Pts wife is wanting venlafaxine XR (EFFEXOR XR) 75 MG 24 hr capsule  sent to [facility], [place] instead of [facility]

## 2017-04-13 ENCOUNTER — Ambulatory Visit
Admission: RE | Admit: 2017-04-13 | Discharge: 2017-04-13 | Disposition: A | Discharge status: Home / Self Care | Payer: Medicare Other | Source: Ambulatory Visit | Attending: Family Medicine | Admitting: Family Medicine

## 2017-04-13 ENCOUNTER — Other Ambulatory Visit: Payer: Self-pay | Admitting: Family Medicine

## 2017-04-13 DIAGNOSIS — E7800 Pure hypercholesterolemia, unspecified: Secondary | ICD-10-CM | POA: Diagnosis not present

## 2017-04-13 DIAGNOSIS — N529 Male erectile dysfunction, unspecified: Secondary | ICD-10-CM | POA: Diagnosis not present

## 2017-04-13 DIAGNOSIS — M542 Cervicalgia: Secondary | ICD-10-CM

## 2017-04-13 DIAGNOSIS — G4733 Obstructive sleep apnea (adult) (pediatric): Secondary | ICD-10-CM | POA: Diagnosis not present

## 2017-04-13 DIAGNOSIS — N182 Chronic kidney disease, stage 2 (mild): Secondary | ICD-10-CM | POA: Diagnosis not present

## 2017-04-13 DIAGNOSIS — I129 Hypertensive chronic kidney disease with stage 1 through stage 4 chronic kidney disease, or unspecified chronic kidney disease: Secondary | ICD-10-CM | POA: Diagnosis not present

## 2017-04-13 DIAGNOSIS — R569 Unspecified convulsions: Secondary | ICD-10-CM | POA: Diagnosis not present

## 2017-04-13 DIAGNOSIS — G309 Alzheimer's disease, unspecified: Secondary | ICD-10-CM | POA: Diagnosis not present

## 2017-04-13 DIAGNOSIS — F322 Major depressive disorder, single episode, severe without psychotic features: Secondary | ICD-10-CM | POA: Diagnosis not present

## 2017-04-13 DIAGNOSIS — E1129 Type 2 diabetes mellitus with other diabetic kidney complication: Secondary | ICD-10-CM | POA: Diagnosis not present

## 2017-04-13 DIAGNOSIS — M109 Gout, unspecified: Secondary | ICD-10-CM | POA: Diagnosis not present

## 2017-04-13 DIAGNOSIS — Z0000 Encounter for general adult medical examination without abnormal findings: Secondary | ICD-10-CM | POA: Diagnosis not present

## 2017-04-14 DIAGNOSIS — R319 Hematuria, unspecified: Secondary | ICD-10-CM | POA: Diagnosis not present

## 2017-04-19 ENCOUNTER — Ambulatory Visit (INDEPENDENT_AMBULATORY_CARE_PROVIDER_SITE_OTHER): Payer: Medicare Other | Admitting: Neurology

## 2017-04-19 ENCOUNTER — Encounter: Payer: Self-pay | Admitting: Neurology

## 2017-04-19 VITALS — BP 125/80 | HR 75 | Wt 173.5 lb

## 2017-04-19 DIAGNOSIS — R413 Other amnesia: Secondary | ICD-10-CM | POA: Diagnosis not present

## 2017-04-19 DIAGNOSIS — R569 Unspecified convulsions: Secondary | ICD-10-CM

## 2017-04-19 MED ORDER — DIVALPROEX SODIUM 500 MG PO DR TAB: 500.0000 mg | DELAYED_RELEASE_TABLET | Freq: Two times a day (BID) | ORAL | 3 refills | Status: DC

## 2017-04-19 NOTE — Progress Notes (Signed)
Reason for visit: Seizures  FINDLAY DAGHER is an 67 y.o. male  History of present illness:  Mr. Lindahl is a 67 year old left-handed white male with a history of nocturnal seizures.  The patient has a history of alcohol overuse, he has been able to cut back to drinking 3 or 4 glasses of wine at night.  He has not had any seizure events since last seen in August 2018.  The patient sleeps a lot during the day.  He has had a lot of problems with tolerance of the Keppra, he has had anxiety and irritability on the medication even out of 250 mg daily dose.  He is unable to tolerate higher doses than that.  His wife indicates that he is not eating well.  The patient returns to the office today for an evaluation.  Past Medical History:  Diagnosis Date  . Convulsions/seizures ([place]) 07/05/2014  . COPD (chronic obstructive pulmonary disease) ([place])   . Depression   . Diabetes ([place])   . Gout   . Hypertension   . Memory disturbance 01/26/2013  . Prostate cancer ([place])   . RLS (restless legs syndrome)     Past Surgical History:  Procedure Laterality Date  . COLONOSCOPY  2009   JAN  . PROSTATECTOMY    . TONSILLECTOMY AND ADENOIDECTOMY      Family History  Problem Relation Age of Onset  . Dementia Mother   . Pancreatic cancer Father   . Seizures Neg Hx     Social history:  reports that he quit smoking about 4 years ago. he has never used smokeless tobacco. He reports that he drinks alcohol. He reports that he does not use drugs.   No Known Allergies  Medications:  Prior to Admission medications   Medication Sig Start Date End Date Taking? Authorizing Provider  allopurinol (ZYLOPRIM) 100 MG tablet Take 100 mg by mouth daily. 05/11/13  Yes [provider]  ALPRAZolam (XANAX) 0.25 MG tablet Take 0.25 mg by mouth 2 (two) times daily as needed for anxiety.    Yes [provider]  aspirin 81 MG tablet Take 81 mg by mouth daily.   Yes [provider]    atenolol-chlorthalidone (TENORETIC) 50-25 MG per tablet Take 2 tablets by mouth daily.    Yes [provider]  KLOR-CON M20 20 MEQ tablet Take 20 mEq by mouth 2 (two) times daily.  02/18/16  Yes [provider]  levETIRAcetam (KEPPRA) 250 MG tablet Take 250 mg by mouth at bedtime.  03/10/17  Yes [provider]  memantine (NAMENDA) 10 MG tablet Take 1 tablet (10 mg total) by mouth 2 (two) times daily. 08/29/14  Yes Ward Givens, NP  metFORMIN (GLUCOPHAGE) 500 MG tablet 2 (two) times daily with a meal.  09/02/15  Yes [provider]  simvastatin (ZOCOR) 20 MG tablet Take 20 mg by mouth daily.   Yes [provider]  venlafaxine XR (EFFEXOR XR) 75 MG 24 hr capsule Take 1 capsule (75 mg total) by mouth daily with breakfast. 03/04/17  Yes Kathrynn Ducking, MD  divalproex (DEPAKOTE) 500 MG DR tablet Take 1 tablet (500 mg total) by mouth 2 (two) times daily. 04/19/17   Kathrynn Ducking, MD    ROS:  Out of a complete 14 system review of symptoms, the patient complains only of the following symptoms, and all other reviewed systems are negative.  Decreased appetite Daytime sleepiness, sleep talking, acting out dreams Walking difficulty Memory loss,  headache, weakness Agitation, confusion, anxiety  Blood pressure 125/80, pulse 75, weight 173 lb 8 oz (78.7 kg), SpO2 98 %.  Physical Exam  General: The patient is alert and cooperative at the time of the examination.  Skin: No significant peripheral edema is noted.   Neurologic Exam  Mental status: The patient is alert and oriented x 3 at the time of the examination. The Mini-Mental status examination done today shows a total score of 21/30.   Cranial nerves: Facial symmetry is present. Speech is normal, no aphasia or dysarthria is noted. Extraocular movements are full. Visual fields are full.  Motor: The patient has good strength in all 4 extremities.  Sensory examination: Soft touch sensation is  symmetric on the face, arms, and legs.  Coordination: The patient has good finger-nose-finger and heel-to-shin bilaterally.  Gait and station: The patient has a normal gait. Tandem gait is normal. Romberg is negative. No drift is seen.  Reflexes: Deep tendon reflexes are symmetric.   Assessment/Plan:  1.  Nocturnal seizures  2.  Alcohol overuse  The patient is not tolerating the Keppra well, this may be the source of his anxiety and irritability.  He cannot tolerate doses higher than 250 mg at night.  We will start Depakote taking 500 mg daily for 1 week and then go to 500 mg twice daily.  He will stop the Keppra after 2 weeks.  We will get him back in about 6-8 weeks to check blood work.  The memory issues have been stable since last summer, the patient remains on Namenda.  Jill Alexanders MD 04/19/2017 12:26 PM  [facility] [address] [place], [place] [address]  Phone [phone] Fax [phone]

## 2017-04-19 NOTE — Patient Instructions (Signed)
   We will start Depakote 500 mg a day for one week, then take one twice a day.  Stop the Keppra in 2 weeks.

## 2017-04-29 DIAGNOSIS — R3129 Other microscopic hematuria: Secondary | ICD-10-CM | POA: Diagnosis not present

## 2017-04-29 DIAGNOSIS — N393 Stress incontinence (female) (male): Secondary | ICD-10-CM | POA: Diagnosis not present

## 2017-04-29 DIAGNOSIS — C61 Malignant neoplasm of prostate: Secondary | ICD-10-CM | POA: Diagnosis not present

## 2017-05-13 DIAGNOSIS — N200 Calculus of kidney: Secondary | ICD-10-CM | POA: Diagnosis not present

## 2017-05-13 DIAGNOSIS — R3129 Other microscopic hematuria: Secondary | ICD-10-CM | POA: Diagnosis not present

## 2017-05-17 ENCOUNTER — Telehealth: Payer: Self-pay | Admitting: Neurology

## 2017-05-17 NOTE — Telephone Encounter (Signed)
Pt's wife called he has increased divalproex (DEPAKOTE) 500 MG DR tablet to 1am and 1 pm on 3/1. Today they were walking the dogs and he fell down. Please call to discuss

## 2017-05-17 NOTE — Telephone Encounter (Signed)
I called the patient, talk with the wife.  The patient has had episodes over the last several days where he will stand up and feel weak and dizzy.  The blood pressure can be checked at home, have asked the wife to check it sitting and then standing.  I will try to get the patient in for a revisit to evaluate this issue.  The only new medication is the Depakote.

## 2017-05-18 NOTE — Telephone Encounter (Signed)
Called wife. Scheduled work in visit for 05/21/17 at 9:00am, check in 8:30am. She verbalized understanding and appreciation for call.

## 2017-05-21 ENCOUNTER — Ambulatory Visit (INDEPENDENT_AMBULATORY_CARE_PROVIDER_SITE_OTHER): Payer: Medicare Other | Admitting: Neurology

## 2017-05-21 ENCOUNTER — Telehealth: Payer: Self-pay | Admitting: Neurology

## 2017-05-21 ENCOUNTER — Encounter: Payer: Self-pay | Admitting: Neurology

## 2017-05-21 VITALS — BP 149/96 | HR 61 | Ht 68.0 in | Wt 170.0 lb

## 2017-05-21 DIAGNOSIS — G3281 Cerebellar ataxia in diseases classified elsewhere: Secondary | ICD-10-CM

## 2017-05-21 DIAGNOSIS — Z5181 Encounter for therapeutic drug level monitoring: Secondary | ICD-10-CM

## 2017-05-21 DIAGNOSIS — I951 Orthostatic hypotension: Secondary | ICD-10-CM | POA: Diagnosis not present

## 2017-05-21 DIAGNOSIS — E538 Deficiency of other specified B group vitamins: Secondary | ICD-10-CM

## 2017-05-21 DIAGNOSIS — R569 Unspecified convulsions: Secondary | ICD-10-CM | POA: Diagnosis not present

## 2017-05-21 DIAGNOSIS — R269 Unspecified abnormalities of gait and mobility: Secondary | ICD-10-CM

## 2017-05-21 NOTE — Patient Instructions (Signed)
   We will get a CT of the brain.

## 2017-05-21 NOTE — Progress Notes (Signed)
Reason for visit: Gait disorder  UTAH Vega is an 67 y.o. male  History of present illness:  Mr. Cleavenger is a 67 year old left-handed white male with a history of diabetes, hypertension, alcohol overuse, and a history of seizures.  The patient was on Keppra but he did not tolerate the medication well secondary to anxiety issues.  He has since been switched over to Depakote.  Within the last 1 week he has had a change in his ability to ambulate.  He is falling backwards frequently, almost on a daily basis.  He has hit his head on occasion.  He continues to drink 3-4 glasses of wine in the evening, he also takes alprazolam at night 0.25 mg before going to bed.  The patient generally has issues with falling at nighttime.  The wife has noted that standing his blood pressures will go into the 54Y systolic range.  The patient denies any dizziness, he has not had any fainting episodes.  He denies headache, neck pain or back pain.  He denies numbness in the feet or burning in the feet.  The only new medication that he is on is the Depakote.  He comes to this office for an evaluation.  Past Medical History:  Diagnosis Date  . Convulsions/seizures ([place]) 07/05/2014  . COPD (chronic obstructive pulmonary disease) ([place])   . Depression   . Diabetes ([place])   . Gout   . Hypertension   . Memory disturbance 01/26/2013  . Prostate cancer ([place])   . RLS (restless legs syndrome)     Past Surgical History:  Procedure Laterality Date  . COLONOSCOPY  2009   JAN  . PROSTATECTOMY    . TONSILLECTOMY AND ADENOIDECTOMY      Family History  Problem Relation Age of Onset  . Dementia Mother   . Pancreatic cancer Father   . Seizures Neg Hx     Social history:  reports that he quit smoking about 4 years ago. he has never used smokeless tobacco. He reports that he drinks alcohol. He reports that he does not use drugs.   No Known Allergies  Medications:  Prior to Admission medications   Medication  Sig Start Date End Date Taking? Authorizing Provider  ALPRAZolam (XANAX) 0.25 MG tablet Take 0.25 mg by mouth 2 (two) times daily as needed for anxiety.    Yes [provider]  aspirin 81 MG tablet Take 81 mg by mouth daily.   Yes [provider]  atenolol-chlorthalidone (TENORETIC) 50-25 MG per tablet Take 2 tablets by mouth daily.    Yes [provider]  divalproex (DEPAKOTE) 500 MG DR tablet Take 1 tablet (500 mg total) by mouth 2 (two) times daily. 04/19/17  Yes Kathrynn Ducking, MD  KLOR-CON M20 20 MEQ tablet Take 20 mEq by mouth 2 (two) times daily.  02/18/16  Yes [provider]  levETIRAcetam (KEPPRA) 250 MG tablet Take 250 mg by mouth at bedtime.  03/10/17  Yes [provider]  memantine (NAMENDA) 10 MG tablet Take 1 tablet (10 mg total) by mouth 2 (two) times daily. 08/29/14  Yes Ward Givens, NP  metFORMIN (GLUCOPHAGE) 500 MG tablet 2 (two) times daily with a meal.  09/02/15  Yes [provider]  simvastatin (ZOCOR) 20 MG tablet Take 20 mg by mouth daily.   Yes [provider]  venlafaxine XR (EFFEXOR XR) 75 MG 24 hr capsule Take 1 capsule (75 mg total) by mouth daily with breakfast. 03/04/17  Yes Kathrynn Ducking, MD    ROS:  Out of a complete 14 system review of symptoms, the patient complains only of the following symptoms, and all other reviewed systems are negative.  Activity change, appetite change Daytime sleepiness, acting out dreams Walking difficulty Memory loss, dizziness, weakness, passing out Agitation, decreased concentration, depression, anxiety  Blood pressure (!) 149/96, pulse 61, height 5\' 8"  (1.727 m), weight 170 lb (77.1 kg).   Blood pressure, right arm, sitting is 132/84.  Blood pressure, right arm, standing is 387 systolic.  Physical Exam  General: The patient is alert and cooperative at the time of the examination.  Skin: No significant peripheral edema is noted.   Neurologic  Exam  Mental status: The patient is alert and oriented x 3 at the time of the examination. The patient has apparent normal recent and remote memory, with an apparently normal attention span and concentration ability.   Cranial nerves: Facial symmetry is present. Speech is normal, no aphasia or dysarthria is noted. Extraocular movements are full. Visual fields are full.  Motor: The patient has good strength in all 4 extremities.  Sensory examination: Soft touch sensation is symmetric on the face, arms, and legs.  There is a slight stocking pattern pinprick sensory deficit in the distal one third of the lower extremities.  Coordination: The patient has good finger-nose-finger and heel-to-shin bilaterally.  No asterixis is seen with arms outstretched.  Gait and station: The patient has a slightly wide-based gait, the patient has some instability with turns.  Tandem gait is unsteady.  Romberg is negative.  Reflexes: Deep tendon reflexes are symmetric.   Assessment/Plan:  1.  History of seizures  2.  Alcohol overuse  3.  Gait disorder, new onset  The wife indicates a sudden change in the ability for this patient to ambulate within the last 1 week.  He is on Depakote which is a new medication for him.  The patient also continues to drink alcohol, he uses alprazolam at night.  I have cautioned him about combining the alprazolam with alcohol.  The patient also appears to have some orthostatic hypotension, this may also be a factor in his falls.  He is to drink plenty fluids during the day, he may need to come off of some of his blood pressure medications, consider discontinuing the diuretic.  The patient apparently has lost a lot of weight which may have affected his blood pressure.  We will check blood work today.  We will get a CT scan of the brain.  In the future we may consider EMG nerve conduction study to look for peripheral neuropathy, he may require physical therapy for gait training.  Jill Alexanders MD 05/21/2017 8:56 AM  [facility] [address] [place], [place] [address]  Phone [phone] Fax [phone]

## 2017-05-21 NOTE — Telephone Encounter (Signed)
38medicare/[employer] of life order sent to [facility] no auth require they will reach out to the patient to schedule.

## 2017-05-23 ENCOUNTER — Telehealth: Payer: Self-pay | Admitting: Neurology

## 2017-05-23 NOTE — Telephone Encounter (Signed)
I called concerning the blood work.  There appears to be some renal insufficiency, I have nothing to compare to.  The CO2 level is slightly elevated, chloride level is low, may be related to thiazide diuretic.  The patient still has a low potassium level, he will continue the potassium supplementation.  He will try to drink plenty of fluids and monitor the blood pressure issue.  Depakote level is therapeutic.  I will send the blood work to the primary care physician.

## 2017-05-26 LAB — COMPREHENSIVE METABOLIC PANEL
A/G RATIO: 1.8 (ref 1.2–2.2)
ALBUMIN: 4.7 g/dL (ref 3.6–4.8)
ALT: 28 IU/L (ref 0–44)
AST: 27 IU/L (ref 0–40)
Alkaline Phosphatase: 81 IU/L (ref 39–117)
BUN / CREAT RATIO: 15 (ref 10–24)
BUN: 22 mg/dL (ref 8–27)
Bilirubin Total: 1.1 mg/dL (ref 0.0–1.2)
CALCIUM: 10.2 mg/dL (ref 8.6–10.2)
CO2: 30 mmol/L — ABNORMAL HIGH (ref 20–29)
Chloride: 89 mmol/L — ABNORMAL LOW (ref 96–106)
Creatinine, Ser: 1.48 mg/dL — ABNORMAL HIGH (ref 0.76–1.27)
GFR, EST AFRICAN AMERICAN: 56 mL/min/{1.73_m2} — AB (ref >59)
GFR, EST NON AFRICAN AMERICAN: 48 mL/min/{1.73_m2} — AB (ref >59)
GLOBULIN, TOTAL: 2.6 g/dL (ref 1.5–4.5)
Glucose: 94 mg/dL (ref 65–99)
POTASSIUM: 3.4 mmol/L — AB (ref 3.5–5.2)
SODIUM: 139 mmol/L (ref 134–144)
TOTAL PROTEIN: 7.3 g/dL (ref 6.0–8.5)

## 2017-05-26 LAB — COPPER, SERUM: Copper: 99 ug/dL (ref 72–166)

## 2017-05-26 LAB — CBC WITH DIFFERENTIAL/PLATELET
BASOS: 1 %
Basophils Absolute: 0.1 10*3/uL (ref 0.0–0.2)
EOS (ABSOLUTE): 0.1 10*3/uL (ref 0.0–0.4)
Eos: 1 %
Hematocrit: 42 % (ref 37.5–51.0)
Hemoglobin: 15 g/dL (ref 13.0–17.7)
Immature Grans (Abs): 0 10*3/uL (ref 0.0–0.1)
Immature Granulocytes: 0 %
LYMPHS ABS: 1.8 10*3/uL (ref 0.7–3.1)
Lymphs: 16 %
MCH: 34.7 pg — ABNORMAL HIGH (ref 26.6–33.0)
MCHC: 35.7 g/dL (ref 31.5–35.7)
MCV: 97 fL (ref 79–97)
MONOS ABS: 1.2 10*3/uL — AB (ref 0.1–0.9)
Monocytes: 11 %
Neutrophils Absolute: 7.8 10*3/uL — ABNORMAL HIGH (ref 1.4–7.0)
Neutrophils: 71 %
PLATELETS: 260 10*3/uL (ref 150–379)
RBC: 4.32 x10E6/uL (ref 4.14–5.80)
RDW: 13.6 % (ref 12.3–15.4)
WBC: 11.1 10*3/uL — ABNORMAL HIGH (ref 3.4–10.8)

## 2017-05-26 LAB — AMMONIA: Ammonia: 94 ug/dL (ref 27–102)

## 2017-05-26 LAB — VITAMIN B1: THIAMINE: 112.3 nmol/L (ref 66.5–200.0)

## 2017-05-26 LAB — VALPROIC ACID LEVEL: Valproic Acid Lvl: 68 ug/mL (ref 50–100)

## 2017-05-26 LAB — VITAMIN B12: Vitamin B-12: 552 pg/mL (ref 232–1245)

## 2017-05-31 DIAGNOSIS — N200 Calculus of kidney: Secondary | ICD-10-CM | POA: Diagnosis not present

## 2017-05-31 DIAGNOSIS — R3129 Other microscopic hematuria: Secondary | ICD-10-CM | POA: Diagnosis not present

## 2017-05-31 DIAGNOSIS — C61 Malignant neoplasm of prostate: Secondary | ICD-10-CM | POA: Diagnosis not present

## 2017-06-01 ENCOUNTER — Ambulatory Visit
Admission: RE | Admit: 2017-06-01 | Discharge: 2017-06-01 | Disposition: A | Discharge status: Home / Self Care | Payer: Medicare Other | Source: Ambulatory Visit | Attending: Neurology | Admitting: Neurology

## 2017-06-01 DIAGNOSIS — R269 Unspecified abnormalities of gait and mobility: Secondary | ICD-10-CM | POA: Diagnosis not present

## 2017-06-01 DIAGNOSIS — R569 Unspecified convulsions: Secondary | ICD-10-CM | POA: Diagnosis not present

## 2017-06-02 ENCOUNTER — Telehealth: Payer: Self-pay | Admitting: Neurology

## 2017-06-02 DIAGNOSIS — R269 Unspecified abnormalities of gait and mobility: Secondary | ICD-10-CM

## 2017-06-02 NOTE — Telephone Encounter (Signed)
I called the patient.  The CT of the brain showed some mild small vessel disease, no evidence of stroke or hemorrhage.  The patient has done a bit better with his blood pressures, blood pressure medications has been altered.  He is still having some issues with balance, I will get physical therapy set up for him.   CT brain 06/02/17:  IMPRESSION:  Abnormal CT scan of the head showing mild changes of chronic microvascular ischemia and generalized cerebral atrophy which appear to be slightly age disproportionate.Incidental left parietal scalp soft tissue swelling is noted which may represent scalp hematoma given patient's history of recurrent falls

## 2017-06-18 ENCOUNTER — Ambulatory Visit: Payer: Medicare Other | Admitting: Rehabilitation

## 2017-06-21 ENCOUNTER — Other Ambulatory Visit: Payer: Self-pay

## 2017-06-21 ENCOUNTER — Ambulatory Visit: Payer: Medicare Other | Attending: Neurology

## 2017-06-21 DIAGNOSIS — Z9181 History of falling: Secondary | ICD-10-CM | POA: Diagnosis not present

## 2017-06-21 DIAGNOSIS — R2689 Other abnormalities of gait and mobility: Secondary | ICD-10-CM | POA: Insufficient documentation

## 2017-06-21 DIAGNOSIS — R2681 Unsteadiness on feet: Secondary | ICD-10-CM

## 2017-06-21 DIAGNOSIS — R296 Repeated falls: Secondary | ICD-10-CM | POA: Insufficient documentation

## 2017-06-21 NOTE — Therapy (Signed)
[facility] [address] [place], [place], [address] Phone: [phone]   Fax:  [phone]  Physical Therapy Evaluation  Patient Details  Name: Lucas Vega MRN: [id number] Date of Birth: 29-Dec-1950 Referring Provider: Kathrynn Ducking, MD   Encounter Date: 06/21/2017  PT End of Session - 06/21/17 1626    Visit Number  1    Number of Visits  17    Date for PT Re-Evaluation  08/20/17    PT Start Time  0930    PT Stop Time  1020    PT Time Calculation (min)  50 min    Equipment Utilized During Treatment  Gait belt    Activity Tolerance  Patient tolerated treatment well    Behavior During Therapy  [facility] for tasks assessed/performed;Impulsive       Past Medical History:  Diagnosis Date  . Convulsions/seizures ([place]) 07/05/2014  . COPD (chronic obstructive pulmonary disease) ([place])   . Depression   . Diabetes ([place])   . Gout   . Hypertension   . Memory disturbance 01/26/2013  . Prostate cancer ([place])   . RLS (restless legs syndrome)     Past Surgical History:  Procedure Laterality Date  . COLONOSCOPY  2009   JAN  . PROSTATECTOMY    . TONSILLECTOMY AND ADENOIDECTOMY      There were no vitals filed for this visit.  Subjective Assessment - 06/21/17 0936    Subjective  Pt is a 67 y/o m who has been referred to OPPT by Kathrynn Ducking, MD for multiple falls and gait disturbance on 06/02/2017. CT on 06/02/2017 abnormal chronic microvascular ischemia and cerebral atrophy.  Pt wife reports 3 years back patient had a few short seizures after his diagnosis of Alzheimer's. Last seizure was over a year ago and occurred during the night when he was sleeping.  Pt's wife reports patient has difficulty with activity at night more often than in the morning. All of patient's falls occur at night and when changing positions (pt wife reports they changed BP medication and pt reports less falls due to change in position since  then). Patient commonly falls when he is distracted and catches his foot or when he is turning quickly. Patient's wife as reports that patient has demonstrated significant impact to his hand writing (getting much sloppier).     Patient is accompained by:  Family member    Pertinent History  HTN, seizures, COPD, depression, memory disturbance, RLS, hx of prostate CA, gout, DM    Limitations  Walking;House hold activities    Patient Stated Goals  Pt reports wanting to walk better and fall less     Currently in Pain?  No/denies        [facility] PT Assessment - 06/21/17 0940      Assessment   Medical Diagnosis  Frequent falls    Referring Provider  Kathrynn Ducking, MD    Onset Date/Surgical Date  05/05/17 referral date    Hand Dominance  Left      Precautions   Precautions  Fall    Precaution Comments  based on cognitive TUG and DGI      Restrictions   Weight Bearing Restrictions  No      Balance Screen   Has the patient fallen in the past 6 months  Yes    How many times?  8+ most at night     Has the patient had a decrease in activity level because  of a fear of falling?   Yes walked every day 2+ miles in past    Is the patient reluctant to leave their home because of a fear of falling?   Yes      [place] residence in transition from home to apt. due to renovations    Living Arrangements  Spouse/significant other    Available Help at Discharge  Family    Type of [place] to enter    Entrance Stairs-Number of Steps  3x2    Entrance Stairs-Rails  Right;Left;Cannot reach both    [place]  One level    [place] - single point    Additional Comments  Original home 5 steps to enter single story andcan only reach 1 rail to enter.       Prior Function   Level of Independence  Independent with basic ADLs    Vocation  Retired    Programme researcher, broadcasting/film/video in the past     Leisure  golf in the past.        Cognition   Overall Cognitive Status  History of cognitive impairments - at baseline      Posture/Postural Control   Posture/Postural Control  Postural limitations    Postural Limitations  Rounded Shoulders;Forward head      ROM / Strength   AROM / PROM / Strength  Strength      Strength   Overall Strength  Deficits    Strength Assessment Site  Shoulder;Knee;Ankle;Hip;Elbow    Right/Left Shoulder  Right;Left    Right Shoulder Flexion  4+/5    Right Shoulder ABduction  4/5 mild pain     Right Shoulder Internal Rotation  4-/5    Right Shoulder External Rotation  4/5    Left Shoulder Flexion  4/5    Left Shoulder ABduction  4/5    Left Shoulder Internal Rotation  4-/5    Left Shoulder External Rotation  4/5    Right/Left Elbow  Right;Left    Right Elbow Flexion  5/5    Right Elbow Extension  4/5    Left Elbow Flexion  5/5    Left Elbow Extension  4/5    Right/Left Hip  Left;Right    Right Hip Flexion  5/5    Right Hip ABduction  4+/5    Right Hip ADduction  4+/5    Left Hip Flexion  4+/5    Left Hip ABduction  4+/5    Left Hip ADduction  4+/5    Right/Left Knee  Right;Left    Right Knee Flexion  4+/5    Right Knee Extension  5/5    Left Knee Flexion  4+/5    Left Knee Extension  5/5    Right/Left Ankle  Right;Left    Right Ankle Dorsiflexion  5/5    Right Ankle Plantar Flexion  4/5    Left Ankle Dorsiflexion  4/5    Left Ankle Plantar Flexion  4/5      Transfers   Transfers  Sit to Stand;Stand to Sit    Sit to Stand  5: Supervision    Stand to Sit  5: Supervision    Comments  Supervision for safety       Ambulation/Gait   Ambulation/Gait  Yes    Ambulation/Gait Assistance  4: Min guard R sway during gait    Ambulation Distance (Feet)  200  Feet    Assistive device  None    Gait Pattern  Step-through pattern;Decreased arm swing - right;Decreased arm swing - left;Lateral trunk lean to right;Trunk flexed;Narrow base of support;Decreased trunk rotation     Ambulation Surface  Level;Indoor    Gait velocity  2.54ft/sec    Stairs  Yes    Stairs Assistance  4: Min guard self corrected LOB during descent    Stair Management Technique  One rail Left;Two rails;Step to pattern;Alternating pattern alternating ascent step to descent    Number of Stairs  4    Height of Stairs  6    Gait Comments  R sway during gait. moderate to maximal cues to keep on task       Standardized Balance Assessment   Standardized Balance Assessment  Dynamic Gait Index;Timed Up and Go Test      Dynamic Gait Index   Level Surface  Mild Impairment    Change in Gait Speed  Moderate Impairment    Gait with Horizontal Head Turns  Mild Impairment    Gait with Vertical Head Turns  Severe Impairment    Gait and Pivot Turn  Moderate Impairment    Step Over Obstacle  Moderate Impairment    Step Around Obstacles  Normal    Steps  Severe Impairment    Total Score  10    DGI comment:  High falls risk       Timed Up and Go Test   TUG  Normal TUG;Cognitive TUG    Normal TUG (seconds)  10.37    Cognitive TUG (seconds)  22.52 unable to continue cog. task     TUG Comments  High falls risk            Objective measurements completed on examination: See above findings.             PT Education - 06/21/17 1626    Education provided  Yes    Education Details  PT POC, falls risk reduction, safety during STS txfs, proper upright posture    Person(s) Educated  Patient;Spouse    Methods  Explanation;Tactile cues;Verbal cues;Handout;Demonstration    Comprehension  Verbalized understanding;Returned demonstration;Need further instruction       PT Short Term Goals - 06/21/17 1652      PT SHORT TERM GOAL #1   Title  Pt will be mod I ( with wife's assistance) with initial HEP(ALL STGS TARGET DATE 07/19/2017)    Status  New    Target Date  --      PT SHORT TERM GOAL #2   Title  Patient will reports 2 weeks without falls to demonstrate improved safety during functional  mobility.    Status  New      PT SHORT TERM GOAL #3   Title  Patient will demonstrate 223ft ambulation with S over even terrain, while performing scanning/cog task mild reduction in gait speed, to improve safety during functional mobililty.     Status  New      PT SHORT TERM GOAL #4   Title  Patient will demonstrate ramp, curb, and stair negotiation with 1 rail, supervision level to reduce risk of falls.    Status  New         PT Long Term Goals - 06/21/17 1654      PT LONG TERM GOAL #1   Title  Patient will be mod I (wife assistance for memory) with long term HEP/fitness program to maintain progress made during PT  session. (ALL LTGS TARGET DATE  08/20/2017)    Status  New    Target Date  --      PT LONG TERM GOAL #2   Title  Patient will complete DGI with a score of >/=19/24 to reduce falls risk     Status  New      PT LONG TERM GOAL #3   Title  Patient will complete cognitive TUG with a reduction in time of</=4.0 seconds in order to reduces falls risk     Status  New      PT LONG TERM GOAL #4   Title  Patient will ambulate 59ft outdoors over grass and pavement with LRAD and supervision level in order to further reduce fall risk in limited community ambulation.     Status  New            Plan - 06/21/17 1647    Clinical Impression Statement  Patient present to PT with a recent history of frequent falls. Upon evaluation of patient's strength, gait, and balance were impaired with moderate strength deficits (L>R). A dynamic gait index score of 10/24 and cognitive TUG time (22.52sec.) indicate pt is at risk for falls. Pt's normal TUG WNL. These objective findings further illustrate patients significant falls risk during gait or dual gait activities.  Based on subjective reports of the high occurrence of falls (commonly occurring at night, patient also has a history of excessive alcohol consumption at night)  and objective findings, patient will benefit greatly from skilled PT  intervention in order to reach patient goals or reduction in falls and falls risk.    History and Personal Factors relevant to plan of care:  HTN, seizures, COPD, depression, memory disturbance, RLS, hx of prostate CA, gout, DM    Clinical Presentation  Evolving    Clinical Presentation due to:  Alzheimers progression     Clinical Decision Making  Moderate    Rehab Potential  Good    Clinical Impairments Affecting Rehab Potential  Good support system     PT Frequency  2x / week    PT Duration  -- 60 days    PT Treatment/Interventions  ADLs/Self Care Home Management;Stair training;Gait training;Functional mobility training;Therapeutic activities;Therapeutic exercise;Balance training;Neuromuscular re-education;Cognitive remediation;Patient/family education;Orthotic Fit/Training;Manual techniques;Passive range of motion;Energy conservation    PT Next Visit Plan  Esteblish HEP for static and dynamic balance    Consulted and Agree with Plan of Care  Patient           Patient will benefit from skilled therapeutic intervention in order to improve the following deficits and impairments:  Abnormal gait, Difficulty walking, Decreased safety awareness, Impaired UE functional use, Decreased activity tolerance, Decreased knowledge of precautions, Impaired perceived functional ability, Decreased balance, Decreased strength, Postural dysfunction  Visit Diagnosis: History of falling  Repeated falls  Unsteadiness on feet  Memory deficit  Other abnormalities of gait and mobility     Problem List Patient Active Problem List   Diagnosis Date Noted  . Convulsions/seizures ([place]) 07/05/2014  . Right shoulder pain 07/05/2014  . Memory disturbance 01/26/2013    Waunita Schooner SPT 06/21/2017, 4:56 PM  [facility] [address] [place], [place], [address] Phone: [phone]   Fax:  [phone]  Name: DOLTON SHAKER MRN:  [id number] Date of Birth: [birth year]

## 2017-06-30 ENCOUNTER — Telehealth: Payer: Self-pay | Admitting: Neurology

## 2017-06-30 ENCOUNTER — Encounter: Payer: Self-pay | Admitting: Neurology

## 2017-06-30 NOTE — Telephone Encounter (Signed)
I am not aware that this patient has Parkinson's disease but he does have a history of significant gait instability, ongoing alcohol overuse.  He has had multiple falls, almost daily.  I will write a letter for jury duty for this reason.

## 2017-06-30 NOTE — Telephone Encounter (Signed)
Pt's wife called he has summoned for jury duty. The pt cannot participate in jury duty due to parkinson's, she would like a letter that will permanently release him from jury duty. Please call when letter is ready

## 2017-07-01 NOTE — Telephone Encounter (Signed)
Called wife (on [place]), advised letter ready for pick up. She verbalized understanding and appreciation for call.

## 2017-07-06 ENCOUNTER — Ambulatory Visit: Payer: Medicare Other | Admitting: Rehabilitation

## 2017-07-06 ENCOUNTER — Encounter: Payer: Self-pay | Admitting: Rehabilitation

## 2017-07-06 DIAGNOSIS — R2681 Unsteadiness on feet: Secondary | ICD-10-CM

## 2017-07-06 DIAGNOSIS — R2689 Other abnormalities of gait and mobility: Secondary | ICD-10-CM | POA: Diagnosis not present

## 2017-07-06 DIAGNOSIS — R296 Repeated falls: Secondary | ICD-10-CM

## 2017-07-06 DIAGNOSIS — Z9181 History of falling: Secondary | ICD-10-CM | POA: Diagnosis not present

## 2017-07-06 NOTE — Patient Instructions (Signed)
Sit to Stand: Head Upright    With head upright, stand up slowly with eyes open. Do from your chair in the living room. Cross your arms and sit on the edge of chair.  Go slowly both ways.  Repeat __10__ times per session. Do __2_ sessions per day.  Copyright  VHI. All rights reserved.   Mini Squat: Double Leg    With feet shoulder width apart, reach forward for balance and do a mini squat. Keep knees in line with second toe. Knees do not go past toes.  Do this with your chair in the living room behind you so you know to aim your butt for chair.  Barely tap your bottom to chair and then return to stand.  Repeat _10__ times per set. Do __1_ sets per session.  http://plyo.exer.us/70   Copyright  VHI. All rights reserved.   FUNCTIONAL MOBILITY: Marching - Standing    March in place by lifting left leg up, then right. Alternate. Do this marching forward along counter top with hand on counter as needed for support.  Have him go really slow and lift knee as high as possible.   Do 4-6 laps in kitchen, do 2 times per day.    Copyright  VHI. All rights reserved.   FUNCTIONAL MOBILITY: Toe Walking    Walk forward on toes. _10__ reps per set, _2__ sets per day, _5-7__ days per week.  Do this along counter top in kitchen.  Do 4-6 laps, 2 times per day.   Copyright  VHI. All rights reserved.   FUNCTIONAL MOBILITY: Heel Walking    Walk forward on heels.  Do this along counter top at home for support as needed.   Do 4-6 laps along counter, 2 times per day.    Copyright  VHI. All rights reserved.

## 2017-07-06 NOTE — Therapy (Signed)
[facility] [address] [place], [place], [address] Phone: [phone]   Fax:  [phone]  Physical Therapy Treatment  Patient Details  Name: Lucas Vega MRN: [id number] Date of Birth: 1950-04-29 Referring Provider: Kathrynn Ducking, MD   Encounter Date: 07/06/2017  PT End of Session - 07/06/17 2003    Visit Number  2    Number of Visits  17    Date for PT Re-Evaluation  08/20/17    PT Start Time  1620 pt late to session    PT Stop Time  1700    PT Time Calculation (min)  40 min    Equipment Utilized During Treatment  Gait belt    Activity Tolerance  Patient tolerated treatment well    Behavior During Therapy  [facility] for tasks assessed/performed;Impulsive       Past Medical History:  Diagnosis Date  . Convulsions/seizures ([place]) 07/05/2014  . COPD (chronic obstructive pulmonary disease) ([place])   . Depression   . Diabetes ([place])   . Gout   . Hypertension   . Memory disturbance 01/26/2013  . Prostate cancer ([place])   . RLS (restless legs syndrome)     Past Surgical History:  Procedure Laterality Date  . COLONOSCOPY  2009   JAN  . PROSTATECTOMY    . TONSILLECTOMY AND ADENOIDECTOMY      There were no vitals filed for this visit.  Subjective Assessment - 07/06/17 1625    Subjective  Has had some falls since but no injuries.  Wife notes that he trips on stairs, when walking dog on uneven surfaces. Wife reports they are in apartment temporarily but will be moving back to home June 1st.      Patient is accompained by:  Family member    Pertinent History  HTN, seizures, COPD, depression, memory disturbance, RLS, hx of prostate CA, gout, DM    Limitations  Walking;House hold activities    Patient Stated Goals  Pt reports wanting to walk better and fall less     Currently in Pain?  Yes    Pain Score  -- doesn't rate but has limited motion    Pain Location  Shoulder    Pain Orientation  Right;Left    Pain  Descriptors / Indicators  Aching    Pain Type  Chronic pain    Pain Onset  More than a month ago    Pain Frequency  Intermittent    Aggravating Factors   movement    Pain Relieving Factors  rest                       [facility] Adult PT Treatment/Exercise - 07/06/17 1620      Ambulation/Gait   Ambulation/Gait  Yes    Ambulation/Gait Assistance  5: Supervision;4: Min guard    Ambulation/Gait Assistance Details  Had pt ambulate throughout therapy gym without AD.  Note mild instability esp with distraction and turning head.  Min/guard to steady in these instances.  Also had pt ambulate indoors with use of SPC with quad tip attachment as he does have this at home.  Note somewhat of an improvement, however needs cues for safe use of cane.  also assessed ambulation outdoors with use of cane.  Had pt ambulate over paved, unlevel surfaces, grassy surfaces and over mulch.  Note that he needs intermittent cues for larger steps (increased hip/knee flex) to avoid catching feet and falling.  He was able to  use cane to assist with balance and therefore recommended use of cane when outdoors over uneven terrain and also recommend wife be close enough to observe him to avoid falling.  Feel that he would not be a candidate for life alert as he would likely not remember to utilize in case of fall.  Pt and wife verbalized understanding.      Ambulation Distance (Feet)  500 Feet    Assistive device  None;Other (Comment) SPC with quad tip attachment    Gait Pattern  Step-through pattern;Decreased arm swing - right;Decreased arm swing - left;Lateral trunk lean to right;Trunk flexed;Narrow base of support;Decreased trunk rotation    Ambulation Surface  Level;Unlevel;Outdoor;Indoor;Paved;Gravel;Grass    Stairs  Yes    Stairs Assistance  5: Supervision    Stairs Assistance Details (indicate cue type and reason)  Continue to address stairs during session as wife reports he tends to catch foot when ascending  stairs.  He did do this during 2 reps on single top step and needed continued cues to increase hip and knee flexion to avoid catching foot on stairs.  Also needs cues for wider BOS when descending stairs to avoid heels catching on each other.  Provided education to wife to cue pt during gait for improved stride length and increased hip/knee flex ("pick your feet up") for improved carryover.      Stair Management Technique  One rail Right;Alternating pattern;Forwards    Number of Stairs  4 x 4 reps    Height of Stairs  6      Neuro Re-ed    Neuro Re-ed Details   Counter top marching, toe walking and heel walking to address balance and BLE strength, see pt instruction for details on reps performed.        Exercises   Exercises  Other Exercises    Other Exercises   Sit<>stand and squats performed and added to HEP to address BLE strength , see pt instruction for details..               PT Education - 07/06/17 1631    Education provided  Yes    Education Details  HEP for balance and strength, use of cane when outdoors to decrease fall risk.     Person(s) Educated  Patient;Spouse    Methods  Explanation;Demonstration;Handout    Comprehension  Verbalized understanding;Returned demonstration       PT Short Term Goals - 06/21/17 1652      PT SHORT TERM GOAL #1   Title  Pt will be mod I ( with wife's assistance) with initial HEP(ALL STGS TARGET DATE 07/19/2017)    Status  New    Target Date  --      PT SHORT TERM GOAL #2   Title  Patient will reports 2 weeks without falls to demonstrate improved safety during functional mobility.    Status  New      PT SHORT TERM GOAL #3   Title  Patient will demonstrate 261ft ambulation with S over even terrain, while performing scanning/cog task mild reduction in gait speed, to improve safety during functional mobililty.     Status  New      PT SHORT TERM GOAL #4   Title  Patient will demonstrate ramp, curb, and stair negotiation with 1 rail,  supervision level to reduce risk of falls.    Status  New        PT Long Term Goals - 06/21/17 1654  PT LONG TERM GOAL #1   Title  Patient will be mod I (wife assistance for memory) with long term HEP/fitness program to maintain progress made during PT session. (ALL LTGS TARGET DATE  08/20/2017)    Status  New    Target Date  --      PT LONG TERM GOAL #2   Title  Patient will complete DGI with a score of >/=19/24 to reduce falls risk     Status  New      PT LONG TERM GOAL #3   Title  Patient will complete cognitive TUG with a reduction in time of</=4.0 seconds in order to reduces falls risk     Status  New      PT LONG TERM GOAL #4   Title  Patient will ambulate 534ft outdoors over grass and pavement with LRAD and supervision level in order to further reduce fall risk in limited community ambulation.     Status  New            Plan - 07/06/17 2004    Clinical Impression Statement  Skilled session addressed gait (indoor/outdoor with and without cane) and stairs for improved safety at home.  Recommend use of cane when outdoors (esp later in the day as pts wife reports this is when balance is most impaired).  Also initiated HEP for balance and LE strength.      Rehab Potential  Good    Clinical Impairments Affecting Rehab Potential  Good support system     PT Frequency  2x / week    PT Duration  -- 60 days    PT Treatment/Interventions  ADLs/Self Care Home Management;Stair training;Gait training;Functional mobility training;Therapeutic activities;Therapeutic exercise;Balance training;Neuromuscular re-education;Cognitive remediation;Patient/family education;Orthotic Fit/Training;Manual techniques;Passive range of motion;Energy conservation    PT Next Visit Plan  Assess compliance with HEP-add as needed, gait outdoors, functional strengthening    Consulted and Agree with Plan of Care  Patient       Patient will benefit from skilled therapeutic intervention in order to  improve the following deficits and impairments:  Abnormal gait, Difficulty walking, Decreased safety awareness, Impaired UE functional use, Decreased activity tolerance, Decreased knowledge of precautions, Impaired perceived functional ability, Decreased balance, Decreased strength, Postural dysfunction  Visit Diagnosis: Repeated falls  Unsteadiness on feet  Other abnormalities of gait and mobility     Problem List Patient Active Problem List   Diagnosis Date Noted  . Convulsions/seizures ([place]) 07/05/2014  . Right shoulder pain 07/05/2014  . Memory disturbance 01/26/2013    Cameron Sprang, PT, MPT [facility] [address] [place], [place], [address] Phone: [phone]   Fax:  [phone] 07/06/17, 8:08 PM  Name: Lucas Vega MRN: [id number] Date of Birth: 1950-07-30

## 2017-07-09 ENCOUNTER — Ambulatory Visit: Payer: Medicare Other | Attending: Neurology | Admitting: Physical Therapy

## 2017-07-09 ENCOUNTER — Encounter: Payer: Self-pay | Admitting: Physical Therapy

## 2017-07-09 DIAGNOSIS — R296 Repeated falls: Secondary | ICD-10-CM | POA: Diagnosis not present

## 2017-07-09 DIAGNOSIS — R2681 Unsteadiness on feet: Secondary | ICD-10-CM | POA: Diagnosis not present

## 2017-07-09 DIAGNOSIS — R2689 Other abnormalities of gait and mobility: Secondary | ICD-10-CM

## 2017-07-09 DIAGNOSIS — Z9181 History of falling: Secondary | ICD-10-CM | POA: Insufficient documentation

## 2017-07-09 NOTE — Therapy (Signed)
[place] [address] [place], [place], [address] Phone: [phone]   Fax:  [phone]  Physical Therapy Treatment  Patient Details  Name: Lucas Vega MRN: [id number] Date of Birth: Jun 03, 1950 Referring Provider: Kathrynn Ducking, MD   Encounter Date: 07/09/2017  PT End of Session - 07/09/17 1455    Visit Number  3    Number of Visits  17    Date for PT Re-Evaluation  08/20/17    PT Start Time  0931    PT Stop Time  1015    PT Time Calculation (min)  44 min       Past Medical History:  Diagnosis Date  . Convulsions/seizures ([place]) 07/05/2014  . COPD (chronic obstructive pulmonary disease) ([place])   . Depression   . Diabetes ([place])   . Gout   . Hypertension   . Memory disturbance 01/26/2013  . Prostate cancer ([place])   . RLS (restless legs syndrome)     Past Surgical History:  Procedure Laterality Date  . COLONOSCOPY  2009   JAN  . PROSTATECTOMY    . TONSILLECTOMY AND ADENOIDECTOMY      There were no vitals filed for this visit.  Subjective Assessment - 07/09/17 1447    Subjective  Wife states that Wed. was not a good day for patient; states he was dizzy and off balance- wife states dizziness is usually at night and not during the day; pt states today is a better day, states he feels good today    Patient is accompained by:  Family member wife    Pertinent History  HTN, seizures, COPD, depression, memory disturbance, RLS, hx of prostate CA, gout, DM    Limitations  Walking;House hold activities    Patient Stated Goals  Pt reports wanting to walk better and fall less     Currently in Pain?  No/denies                       [facility] Adult PT Treatment/Exercise - 07/09/17 0947      Transfers   Transfers  Sit to Stand    Sit to Stand  5: Supervision    Number of Reps  10 reps;Other reps (comment) 13 reps    Comments  Pt performed 3 reps with feet on floor, then 10 reps with feet on  Airex for increased balance challenge      Ambulation/Gait   Ambulation/Gait  Yes    Ambulation/Gait Assistance  5: Supervision    Ambulation/Gait Assistance Details  verbal cues for pt to lift feet, place heel down first as pt was scuffing feet at start of gait training    Assistive device  Other (Comment);Straight cane cane with rubber quad tip    Gait Pattern  Step-through pattern    Ambulation Surface  Level;Unlevel;Indoor;Outdoor;Paved;Grass    Curb  5: Supervision cues for sequence with cane      Exercises   Exercises  Knee/Hip;Ankle      Knee/Hip Exercises: Standing   Heel Raises  Both;1 set;10 reps;2 seconds    Forward Step Up  Right;Left;1 set;10 reps;Hand Hold: 1;Hand Hold: 0;Step Height: 6"    SLS with Vectors  pt stood on Bosu inside // bars with UE support prn moving each leg up/back and then out/in 10 reps each leg      NeuroRe-ed:  Pt stood on blue Airex inside // bars - performed UE strengthening exercises with green theraband with  pt holding handles and PT holding band in middle for resistance For each UE; pt performed shoulder extension with elbows flexed at 90 degrees 10 reps; shoulder abduction 10 reps UE's together; alternating shoulder flexion 10 reps each UE For core stabilization and for improved balance on compliant surface  Pt performed amb. On tip toes 3 laps inside // bars without UE support;  2 laps inside bars backward on tiptoes; sideways on tiptoes 10' x 2 reps inside // bars  Pt performed standing hip flexion, abduction, and extension alternating 10 reps each leg standing on blue mat on floor - min to mod assist for recovery of LOB   Marching on airex 10 reps each leg with CGA to min assist; alternate tap downs to floor 5 reps each leg - standing on blue Airex with CGA       PT Short Term Goals - 06/21/17 1652      PT SHORT TERM GOAL #1   Title  Pt will be mod I ( with wife's assistance) with initial HEP(ALL STGS TARGET DATE 07/19/2017)    Status   New    Target Date  --      PT SHORT TERM GOAL #2   Title  Patient will reports 2 weeks without falls to demonstrate improved safety during functional mobility.    Status  New      PT SHORT TERM GOAL #3   Title  Patient will demonstrate 239ft ambulation with S over even terrain, while performing scanning/cog task mild reduction in gait speed, to improve safety during functional mobililty.     Status  New      PT SHORT TERM GOAL #4   Title  Patient will demonstrate ramp, curb, and stair negotiation with 1 rail, supervision level to reduce risk of falls.    Status  New        PT Long Term Goals - 06/21/17 1654      PT LONG TERM GOAL #1   Title  Patient will be mod I (wife assistance for memory) with long term HEP/fitness program to maintain progress made during PT session. (ALL LTGS TARGET DATE  08/20/2017)    Status  New    Target Date  --      PT LONG TERM GOAL #2   Title  Patient will complete DGI with a score of >/=19/24 to reduce falls risk     Status  New      PT LONG TERM GOAL #3   Title  Patient will complete cognitive TUG with a reduction in time of</=4.0 seconds in order to reduces falls risk     Status  New      PT LONG TERM GOAL #4   Title  Patient will ambulate 537ft outdoors over grass and pavement with LRAD and supervision level in order to further reduce fall risk in limited community ambulation.     Status  New            Plan - 07/09/17 1456    Clinical Impression Statement  Wife reports pt is not very compliant with HEP, but she is trying to assist and ensure that he does the exercises; no exercises added due to limited compliance at this time. Pt had some LOB with standing activities on compliant surface (blue mat) requiring min assist for recovery.  Memory deficits appear to impact safety awareness.  No major LOB occurred with gait training outdoors with pt using his rubber quad based cane.  Pt did required cues to lift feet to avoid scuffing feet on  floor.     Rehab Potential  Good    Clinical Impairments Affecting Rehab Potential  Good support system     PT Frequency  2x / week    PT Treatment/Interventions  ADLs/Self Care Home Management;Stair training;Gait training;Functional mobility training;Therapeutic activities;Therapeutic exercise;Balance training;Neuromuscular re-education;Cognitive remediation;Patient/family education;Orthotic Fit/Training;Manual techniques;Passive range of motion;Energy conservation    PT Next Visit Plan  continue balance and gait training - no exercises added on 07-09-17 due to limited compliance at this time per wife's report    Consulted and Agree with Plan of Care  Patient;Family member/caregiver    Family Member Consulted  wife       Patient will benefit from skilled therapeutic intervention in order to improve the following deficits and impairments:  Abnormal gait, Difficulty walking, Decreased safety awareness, Impaired UE functional use, Decreased activity tolerance, Decreased knowledge of precautions, Impaired perceived functional ability, Decreased balance, Decreased strength, Postural dysfunction  Visit Diagnosis: Other abnormalities of gait and mobility  Unsteadiness on feet     Problem List Patient Active Problem List   Diagnosis Date Noted  . Convulsions/seizures ([place]) 07/05/2014  . Right shoulder pain 07/05/2014  . Memory disturbance 01/26/2013    Alda Lea, PT 07/09/2017, 3:06 PM  [place] [address] [place], [place], [address] Phone: [phone]   Fax:  [phone]  Name: Lucas Vega MRN: [id number] Date of Birth: [birth year]

## 2017-07-13 ENCOUNTER — Ambulatory Visit: Payer: Medicare Other | Admitting: Rehabilitation

## 2017-07-13 ENCOUNTER — Encounter: Payer: Self-pay | Admitting: Rehabilitation

## 2017-07-13 DIAGNOSIS — R2689 Other abnormalities of gait and mobility: Secondary | ICD-10-CM

## 2017-07-13 DIAGNOSIS — R296 Repeated falls: Secondary | ICD-10-CM

## 2017-07-13 DIAGNOSIS — R2681 Unsteadiness on feet: Secondary | ICD-10-CM | POA: Diagnosis not present

## 2017-07-13 DIAGNOSIS — Z9181 History of falling: Secondary | ICD-10-CM | POA: Diagnosis not present

## 2017-07-13 NOTE — Therapy (Signed)
[facility] [address] [place], [place], [address] Phone: [phone]   Fax:  [phone]  Physical Therapy Treatment  Patient Details  Name: Lucas Vega MRN: [id number] Date of Birth: 1950/06/23 Referring Provider: Kathrynn Ducking, MD   Encounter Date: 07/13/2017  PT End of Session - 07/13/17 2013    Visit Number  4    Number of Visits  17    Date for PT Re-Evaluation  08/20/17    PT Start Time  1618    PT Stop Time  1700    PT Time Calculation (min)  42 min    Activity Tolerance  Patient tolerated treatment well    Behavior During Therapy  [facility] for tasks assessed/performed;Impulsive       Past Medical History:  Diagnosis Date  . Convulsions/seizures ([place]) 07/05/2014  . COPD (chronic obstructive pulmonary disease) ([place])   . Depression   . Diabetes ([place])   . Gout   . Hypertension   . Memory disturbance 01/26/2013  . Prostate cancer ([place])   . RLS (restless legs syndrome)     Past Surgical History:  Procedure Laterality Date  . COLONOSCOPY  2009   JAN  . PROSTATECTOMY    . TONSILLECTOMY AND ADENOIDECTOMY      There were no vitals filed for this visit.  Subjective Assessment - 07/13/17 1623    Subjective  Reports today has been better, no dizziness.     Patient is accompained by:  Family member Cam    Pertinent History  HTN, seizures, COPD, depression, memory disturbance, RLS, hx of prostate CA, gout, DM    Limitations  Walking;House hold activities    Patient Stated Goals  Pt reports wanting to walk better and fall less     Currently in Pain?  No/denies                       [facility] Adult PT Treatment/Exercise - 07/13/17 0001      Ambulation/Gait   Ambulation/Gait  Yes    Ambulation/Gait Assistance  3: Mod assist;4: Min assist    Ambulation/Gait Assistance Details  Pts wife reports that she would like for pt to return to gym with her in order to conintue to work on his  strength and endurance.  She reports that he used to walk on the treadmill and when asked pt states he would like to do this again.  PT assessed safety with walking on treadmill during our session.  Pt ambulated 4 mins at speed of 1.6 mph with BUE support.  Pt needs max cues for standing upright and keeping body over feet as he tended to lean forward onto support and also needs max cues for picking up feet and aiming for front of treadmill.  Pt easily distracted x 2 requiring mod A to steady and regain balance.  PT did not feel that pt could safely do this at gym, despite having S from wife.  Both verbalized understanding.     Assistive device  -- treadmill    Gait Pattern  Step-through pattern    Ambulation Surface  -- treadmill      Self-Care   Self-Care  Other Self-Care Comments    Other Self-Care Comments   PT had long discussion with wife and pt regarding pts progress and compliance with HEP.  Pt has not been very compliant with exercises and wife has not re-inforced them at home.  Note that they are  packing to transition back to their house beginning of June.  Therefore PT discussed that due to his Alzheimer's PT would recommend finding things that pt already enjoys.  Whether it is walking outdoors at park, going shopping/to store with wife.  Anything to keep him moving for improved endurance and strengthening.  Pts wife inquiring about returning to gym with her.  See above for details on treadmill, however feel that he could do seated cardio machines at a S level and also could do seated LE strengthening with S and set up from wife.  Pt and wife verbalized understanding.       Exercises   Exercises  Other Exercises    Other Exercises   Seated nustep x 5 mins with BUEs/LEs at level 4 resistance (maintaining steps per minute in 50s-60s), Leg press x 10 reps with 50lbs, x 10 reps with 70 lbs with cues for slow controlled movement, Standing bicep curl x 10 reps with 3lbs, standing shoulder flex x 10  reps with 3 lbs (unable to get full ROM due to shoulder issues), tricep extension (standing) with 3 lbs x 10 reps with tactile cues for technique.  Marching along counter top forwards/backwards with light UE support x 4 reps each direction.               PT Education - 07/13/17 2013    Education provided  Yes    Education Details  see self care, education to monitor BP when pt feeling dizzy, also encouraged him to drink more water throughout the day.     Person(s) Educated  Patient;Spouse    Methods  Explanation    Comprehension  Verbalized understanding       PT Short Term Goals - 06/21/17 1652      PT SHORT TERM GOAL #1   Title  Pt Lucas be mod I ( with wife's assistance) with initial HEP(ALL STGS TARGET DATE 07/19/2017)    Status  New    Target Date  --      PT SHORT TERM GOAL #2   Title  Patient Lucas reports 2 weeks without falls to demonstrate improved safety during functional mobility.    Status  New      PT SHORT TERM GOAL #3   Title  Patient Lucas demonstrate 287ft ambulation with S over even terrain, while performing scanning/cog task mild reduction in gait speed, to improve safety during functional mobililty.     Status  New      PT SHORT TERM GOAL #4   Title  Patient Lucas demonstrate ramp, curb, and stair negotiation with 1 rail, supervision level to reduce risk of falls.    Status  New        PT Long Term Goals - 06/21/17 1654      PT LONG TERM GOAL #1   Title  Patient Lucas be mod I (wife assistance for memory) with long term HEP/fitness program to maintain progress made during PT session. (ALL LTGS TARGET DATE  08/20/2017)    Status  New    Target Date  --      PT LONG TERM GOAL #2   Title  Patient Lucas complete DGI with a score of >/=19/24 to reduce falls risk     Status  New      PT LONG TERM GOAL #3   Title  Patient Lucas complete cognitive TUG with a reduction in time of</=4.0 seconds in order to reduces falls risk  Status  New      PT LONG TERM  GOAL #4   Title  Patient Lucas ambulate 551ft outdoors over grass and pavement with LRAD and supervision level in order to further reduce fall risk in limited community ambulation.     Status  New            Plan - 07/13/17 2015    Clinical Impression Statement  Skilled session focused on emphasizing more functional mobility at home to improve endurance and strength due to non compliance with HEP.  Encouraged them to walk outdoors (at park), have him go with her to stores/grocery shopping, and also encouraged him to go to gym for seated tasks with S and assist from wife.  Both verbalized understanding.      Rehab Potential  Good    Clinical Impairments Affecting Rehab Potential  Good support system     PT Frequency  2x / week    PT Treatment/Interventions  ADLs/Self Care Home Management;Stair training;Gait training;Functional mobility training;Therapeutic activities;Therapeutic exercise;Balance training;Neuromuscular re-education;Cognitive remediation;Patient/family education;Orthotic Fit/Training;Manual techniques;Passive range of motion;Energy conservation    PT Next Visit Plan  continue balance and gait training - very functional due to Alzheimer's    Consulted and Agree with Plan of Care  Patient;Family member/caregiver    Family Member Consulted  wife       Patient Lucas benefit from skilled therapeutic intervention in order to improve the following deficits and impairments:  Abnormal gait, Difficulty walking, Decreased safety awareness, Impaired UE functional use, Decreased activity tolerance, Decreased knowledge of precautions, Impaired perceived functional ability, Decreased balance, Decreased strength, Postural dysfunction  Visit Diagnosis: Other abnormalities of gait and mobility  Unsteadiness on feet  Repeated falls     Problem List Patient Active Problem List   Diagnosis Date Noted  . Convulsions/seizures ([place]) 07/05/2014  . Right shoulder pain 07/05/2014  . Memory  disturbance 01/26/2013    Cameron Sprang, PT, MPT [facility] [address] [place], [place], [address] Phone: [phone]   Fax:  [phone] 07/13/17, 8:18 PM  Name: Lucas Vega MRN: [id number] Date of Birth: October 20, 1950

## 2017-07-16 ENCOUNTER — Ambulatory Visit: Payer: Medicare Other | Admitting: Rehabilitation

## 2017-07-19 ENCOUNTER — Ambulatory Visit: Payer: Medicare Other | Admitting: Physical Therapy

## 2017-07-19 ENCOUNTER — Encounter: Payer: Self-pay | Admitting: Physical Therapy

## 2017-07-19 DIAGNOSIS — Z9181 History of falling: Secondary | ICD-10-CM | POA: Diagnosis not present

## 2017-07-19 DIAGNOSIS — R296 Repeated falls: Secondary | ICD-10-CM | POA: Diagnosis not present

## 2017-07-19 DIAGNOSIS — R2689 Other abnormalities of gait and mobility: Secondary | ICD-10-CM

## 2017-07-19 DIAGNOSIS — R2681 Unsteadiness on feet: Secondary | ICD-10-CM

## 2017-07-20 NOTE — Therapy (Signed)
[facility] [address] [place], [place], [address] Phone: [phone]   Fax:  [phone]  Physical Therapy Treatment  Patient Details  Name: Lucas Vega MRN: [id number] Date of Birth: [birth year] Referring Provider: Kathrynn Ducking, MD   Encounter Date: 07/19/2017  PT End of Session - 07/19/17 1545    Visit Number  5    Number of Visits  17    Date for PT Re-Evaluation  08/20/17    PT Start Time  9924    PT Stop Time  1615    PT Time Calculation (min)  41 min    Equipment Utilized During Treatment  Gait belt    Activity Tolerance  Patient tolerated treatment well    Behavior During Therapy  [facility] for tasks assessed/performed;Impulsive       Past Medical History:  Diagnosis Date  . Convulsions/seizures ([place]) 07/05/2014  . COPD (chronic obstructive pulmonary disease) ([place])   . Depression   . Diabetes ([place])   . Gout   . Hypertension   . Memory disturbance 01/26/2013  . Prostate cancer ([place])   . RLS (restless legs syndrome)     Past Surgical History:  Procedure Laterality Date  . COLONOSCOPY  2009   JAN  . PROSTATECTOMY    . TONSILLECTOMY AND ADENOIDECTOMY      There were no vitals filed for this visit.  Subjective Assessment - 07/19/17 1539    Subjective  [place]- going down steep hills, on loose gravel, grass and on wide stairs with pebbles as foundation with no rails. Multiple scraps, contusions, bruises to bil knees and left foream (forearm covered with banadage). Primary PT notified.               Patient is accompained by:  Family member Cam    Pertinent History  HTN, seizures, COPD, depression, memory disturbance, RLS, hx of prostate CA, gout, DM    Limitations  Walking;House hold activities    Patient Stated Goals  Pt reports wanting to walk better and fall less     Currently in Pain?  No/denies    Pain Score  0-No pain          [facility] Adult PT Treatment/Exercise - 07/19/17 1547      Transfers   Transfers  Sit to Stand;Stand to Sit    Sit to Stand  5: Supervision;With upper extremity assist;From chair/3-in-1;From bed    Stand to Sit  5: Supervision;With upper extremity assist;To bed;To chair/3-in-1      Ambulation/Gait   Ambulation/Gait  Yes    Ambulation/Gait Assistance  4: Min guard    Ambulation/Gait Assistance Details  cues on posture, step length and to keep cane on floor, not in the air with gait. toe scuffing noted with gait as well, especially on complaint surfaces    Ambulation Distance (Feet)  350 Feet x1, 500 x1 in/outdoors    Assistive device  Straight cane    Gait Pattern  Step-through pattern;Narrow base of support    Ambulation Surface  Level;Unlevel;Indoor;Outdoor;Paved;Gravel;Grass    Stairs  Yes    Stairs Assistance  5: Supervision    Stairs Assistance Details (indicate cue type and reason)  no foot catching on steps this session    Stair Management Technique  One rail Right;Alternating pattern;Forwards;With cane    Number of Stairs  4    Height of Stairs  6  Ramp  Other (comment) min guard assist    Ramp Details (indicate cue type and reason)  cues on posture, sequencing and foot clearance      Self-Care   Self-Care  Other Self-Care Comments    Other Self-Care Comments   per spouse they are doing the program at home and plan to transition to the gym after they move back into their house          Balance Exercises - 07/19/17 1601      Balance Exercises: Standing   SLS with Vectors  Solid surface;Foam/compliant surface;Other reps (comment);Limitations    Balance Beam  standing across blue foam beam: alternating fwd stepping to floor/back onto beam, alternating bwd stepping to floor/back onto beam; cues on posture, step length and step height. min guard to min assist for balance with light touch to bars at times to assist with balance.     Sit to Stand Time  with feet across  blue foam beam: sit<>stands x 10 reps with emphasis on tall posture and slow, controlled descent with sitting down.       Balance Exercises: Standing   SLS with Vectors Limitations  on floor with tall cones on floor: alternating fwd toe taps and then alternating cross toe taps. min guard assist to min assist for balance. cues on weight shifting and hip/knee flexion          PT Short Term Goals - 07/19/17 1546      PT SHORT TERM GOAL #1   Title  Pt will be mod I ( with wife's assistance) with initial HEP(ALL STGS TARGET DATE 07/19/2017)    Status  New      PT SHORT TERM GOAL #2   Title  Patient will reports 2 weeks without falls to demonstrate improved safety during functional mobility.    Baseline  07/19/17: had several falls at [place] over the weekend    Status  Not Met      PT SHORT TERM GOAL #3   Title  Patient will demonstrate 246f ambulation with S over even terrain, while performing scanning/cog task mild reduction in gait speed, to improve safety during functional mobililty.     Status  New      PT SHORT TERM GOAL #4   Title  Patient will demonstrate ramp, curb, and stair negotiation with 1 rail, supervision level to reduce risk of falls.    Status  New        PT Long Term Goals - 06/21/17 1654      PT LONG TERM GOAL #1   Title  Patient will be mod I (wife assistance for memory) with long term HEP/fitness program to maintain progress made during PT session. (ALL LTGS TARGET DATE  08/20/2017)    Status  New    Target Date  --      PT LONG TERM GOAL #2   Title  Patient will complete DGI with a score of >/=19/24 to reduce falls risk     Status  New      PT LONG TERM GOAL #3   Title  Patient will complete cognitive TUG with a reduction in time of</=4.0 seconds in order to reduces falls risk     Status  New      PT LONG TERM GOAL #4   Title  Patient will ambulate 5040foutdoors over grass and pavement with LRAD and supervision level in order to further reduce fall risk  in limited community ambulation.  Status  New            Plan - 07/19/17 1545    Clinical Impression Statement  Today's skilled session continued to focues on fall prevention [place] Reinforced use of a cane and somebody being close/next to him when on uneven surfaces. Remainder of session continued to address LE strengthening and balance reactions with no issues reported. Pt is progressing toward goals and should benefit from continued PT to progress toward unmet goals.                      Rehab Potential  Good    Clinical Impairments Affecting Rehab Potential  Good support system     PT Frequency  2x / week    PT Treatment/Interventions  ADLs/Self Care Home Management;Stair training;Gait training;Functional mobility training;Therapeutic activities;Therapeutic exercise;Balance training;Neuromuscular re-education;Cognitive remediation;Patient/family education;Orthotic Fit/Training;Manual techniques;Passive range of motion;Energy conservation    PT Next Visit Plan  continue balance and gait training - very functional due to Alzheimer's    Consulted and Agree with Plan of Care  Patient;Family member/caregiver    Family Member Consulted  wife       Patient will benefit from skilled therapeutic intervention in order to improve the following deficits and impairments:  Abnormal gait, Difficulty walking, Decreased safety awareness, Impaired UE functional use, Decreased activity tolerance, Decreased knowledge of precautions, Impaired perceived functional ability, Decreased balance, Decreased strength, Postural dysfunction  Visit Diagnosis: Other abnormalities of gait and mobility  Unsteadiness on feet  Repeated falls  History of falling     Problem List Patient Active Problem List   Diagnosis Date Noted  . Convulsions/seizures ([place]) 07/05/2014  . Right shoulder pain 07/05/2014  . Memory disturbance 01/26/2013    Willow Ora, PTA,  [facility] [address], [place], [place] [address] [phone] 07/20/17, 10:33 PM   Name: TAYLON COOLE MRN: [id number] Date of Birth: March 15, 1950

## 2017-07-23 ENCOUNTER — Ambulatory Visit: Payer: Medicare Other | Admitting: Rehabilitation

## 2017-07-23 ENCOUNTER — Encounter: Payer: Self-pay | Admitting: Rehabilitation

## 2017-07-23 DIAGNOSIS — R2681 Unsteadiness on feet: Secondary | ICD-10-CM

## 2017-07-23 DIAGNOSIS — Z9181 History of falling: Secondary | ICD-10-CM | POA: Diagnosis not present

## 2017-07-23 DIAGNOSIS — R2689 Other abnormalities of gait and mobility: Secondary | ICD-10-CM

## 2017-07-23 DIAGNOSIS — R296 Repeated falls: Secondary | ICD-10-CM

## 2017-07-24 NOTE — Therapy (Signed)
[facility] [address] [place], [place], [address] Phone: [phone]   Fax:  [phone]  Physical Therapy Treatment  Patient Details  Name: Lucas Vega MRN: [id number] Date of Birth: Jul 22, 1950 Referring Provider: Kathrynn Ducking, MD   Encounter Date: 07/23/2017  PT End of Session - 07/24/17 0739    Visit Number  6    Number of Visits  17    Date for PT Re-Evaluation  08/20/17    PT Start Time  1532    PT Stop Time  1615    PT Time Calculation (min)  43 min    Equipment Utilized During Treatment  Gait belt    Activity Tolerance  Patient tolerated treatment well    Behavior During Therapy  [facility] for tasks assessed/performed;Impulsive       Past Medical History:  Diagnosis Date  . Convulsions/seizures ([place]) 07/05/2014  . COPD (chronic obstructive pulmonary disease) ([place])   . Depression   . Diabetes ([place])   . Gout   . Hypertension   . Memory disturbance 01/26/2013  . Prostate cancer ([place])   . RLS (restless legs syndrome)     Past Surgical History:  Procedure Laterality Date  . COLONOSCOPY  2009   JAN  . PROSTATECTOMY    . TONSILLECTOMY AND ADENOIDECTOMY      There were no vitals filed for this visit.  Subjective Assessment - 07/23/17 1542    Subjective  Pt had two more falls today.  Was unattended by wife, did not have cane.  Has abrasion on knee and small area on face.      Patient is accompained by:  Family member    Limitations  Walking;House hold activities    Patient Stated Goals  Pt reports wanting to walk better and fall less     Currently in Pain?  No/denies                       [facility] Adult PT Treatment/Exercise - 07/24/17 0001      Ambulation/Gait   Ambulation/Gait  Yes    Ambulation/Gait Assistance  4: Min guard    Ambulation/Gait Assistance Details  Continue to address gait over varying surfaces outdoors including transitions from grass to pavement,  uphill/downhill, etc.  Pt did use quad tip cane, however when pt distracted or cued to scan environment, note marked instability and tendency to not use cane appropriately.  Recommend that pt have constant close S to min/guard A when outside.  Also continue to recommend use of gait belt, esp when in unfamiliar environments.       Ambulation Distance (Feet)  1200 Feet    Assistive device  Straight cane with quad tip attachment    Gait Pattern  Step-through pattern;Narrow base of support    Ambulation Surface  Level;Unlevel;Indoor;Outdoor;Paved;Grass    Stairs  Yes    Stairs Assistance  5: Supervision;4: Min guard    Stairs Assistance Details (indicate cue type and reason)  Performed stairs x 6 reps during session for improved carryover with single to B rails alternating pattern with continud cues for larger steps.  Note that he didnt catch heel today as he previously has done several times.      Stair Management Technique  One rail Right;Two rails;Alternating pattern;Forwards    Number of Stairs  4 x 6 reps    Height of Stairs  6      Self-Care   Self-Care  Other  Self-Care Comments    Other Self-Care Comments   PT continued to educate on safety when outdoors-see gait section.  Also had long discussion with pt and spouse that pt will likely need 24/7 S to ensure that he doesn't leave the house unattended (as he did today).  Discussed chair alarm option so that when they are both in the house, wife will be alerted that he is getting up. Discussed respite care/day programs that she can take him to while she runs errands or simply is allowed to rest.  PT to look up respite and day program information and provide at next session.  Pt and wife verbalized understanding and wife interested in these options.        Neuro Re-ed    Neuro Re-ed Details   High level balance to carryover to improved gait.  Stepping over orange barriers forwards x 8 barriers x 8 sets initially stepping forward with each step  landing in new space, however this was too difficult and pt unable to perform without catching barrier at most steps, therefore transitioned to stepping into each space with B feet before moving on to the next with marked improvement, esp with increased reps.  Also performed lateral stepping x 8 barriers x 2 sets each direction with continued cues for stepping but good carryover when performed in opposite direction.  Also performed standing in // bars on foam beam stepping to ground and back alternating LEs x 10 reps on each side with BUE support fading to single UE support with min/guard A.               PT Education - 07/24/17 0739    Education provided  Yes    Education Details  See self care    Person(s) Educated  Patient;Spouse    Methods  Explanation    Comprehension  Verbalized understanding       PT Short Term Goals - 07/19/17 1546      PT SHORT TERM GOAL #1   Title  Pt will be mod I ( with wife's assistance) with initial HEP(ALL STGS TARGET DATE 07/19/2017)    Status  New      PT SHORT TERM GOAL #2   Title  Patient will reports 2 weeks without falls to demonstrate improved safety during functional mobility.    Baseline  07/19/17: had several falls at [place] over the weekend    Status  Not Met      PT SHORT TERM GOAL #3   Title  Patient will demonstrate 257f ambulation with S over even terrain, while performing scanning/cog task mild reduction in gait speed, to improve safety during functional mobililty.     Status  New      PT SHORT TERM GOAL #4   Title  Patient will demonstrate ramp, curb, and stair negotiation with 1 rail, supervision level to reduce risk of falls.    Status  New        PT Long Term Goals - 06/21/17 1654      PT LONG TERM GOAL #1   Title  Patient will be mod I (wife assistance for memory) with long term HEP/fitness program to maintain progress made during PT session. (ALL LTGS TARGET DATE  08/20/2017)    Status  New    Target Date  --      PT  LONG TERM GOAL #2   Title  Patient will complete DGI with a score of >/=19/24 to reduce falls risk  Status  New      PT LONG TERM GOAL #3   Title  Patient will complete cognitive TUG with a reduction in time of</=4.0 seconds in order to reduces falls risk     Status  New      PT LONG TERM GOAL #4   Title  Patient will ambulate 540f outdoors over grass and pavement with LRAD and supervision level in order to further reduce fall risk in limited community ambulation.     Status  New            Plan - 07/24/17 0739    Clinical Impression Statement  Continue to educate on importance of having close S to min/guard A when outdoors.  Pt is leaving home unattended and fell two more times prior to this session.  Will plan to provide information on respite care/day programs to wife at next session. Session focused on outdoor gait and balance activities to emphasize large step length.      Rehab Potential  Good    Clinical Impairments Affecting Rehab Potential  Good support system     PT Frequency  2x / week    PT Treatment/Interventions  ADLs/Self Care Home Management;Stair training;Gait training;Functional mobility training;Therapeutic activities;Therapeutic exercise;Balance training;Neuromuscular re-education;Cognitive remediation;Patient/family education;Orthotic Fit/Training;Manual techniques;Passive range of motion;Energy conservation    PT Next Visit Plan  give respite care/day program info to wife, continue balance and gait training - very functional due to Alzheimer's    Consulted and Agree with Plan of Care  Patient;Family member/caregiver    Family Member Consulted  wife       Patient will benefit from skilled therapeutic intervention in order to improve the following deficits and impairments:  Abnormal gait, Difficulty walking, Decreased safety awareness, Impaired UE functional use, Decreased activity tolerance, Decreased knowledge of precautions, Impaired perceived functional  ability, Decreased balance, Decreased strength, Postural dysfunction  Visit Diagnosis: Other abnormalities of gait and mobility  Unsteadiness on feet  Repeated falls     Problem List Patient Active Problem List   Diagnosis Date Noted  . [place]ulsions[place]seizures ([place] 07/05/2014  . Right shoulder pain 07/05/2014  . Memory disturbance 01/26/2013    ECameron Sprang PT, MPT [facility] [place] [address]Phone: [phone]  Fax:  [phone]/18/19, 7:42 AM  Name: WBENANCIO OSMUNDSONMRN: [id number] of Birth: [birth year]

## 2017-07-26 ENCOUNTER — Encounter: Payer: Self-pay | Admitting: Rehabilitation

## 2017-07-26 ENCOUNTER — Ambulatory Visit: Payer: Medicare Other | Admitting: Rehabilitation

## 2017-07-26 DIAGNOSIS — Z9181 History of falling: Secondary | ICD-10-CM | POA: Diagnosis not present

## 2017-07-26 DIAGNOSIS — R2689 Other abnormalities of gait and mobility: Secondary | ICD-10-CM

## 2017-07-26 DIAGNOSIS — R2681 Unsteadiness on feet: Secondary | ICD-10-CM | POA: Diagnosis not present

## 2017-07-26 DIAGNOSIS — R296 Repeated falls: Secondary | ICD-10-CM

## 2017-07-26 NOTE — Therapy (Signed)
[facility] [address] [place], [place], [address] Phone: [phone]   Fax:  [phone]  Physical Therapy Treatment  Patient Details  Name: Lucas Vega MRN: [id number] Date of Birth: 1950-07-28 Referring Provider: Kathrynn Ducking, MD   Encounter Date: 07/26/2017  PT End of Session - 07/26/17 1110    Visit Number  7    Number of Visits  17    Date for PT Re-Evaluation  08/20/17    PT Start Time  1104    PT Stop Time  1145    PT Time Calculation (min)  41 min    Equipment Utilized During Treatment  Gait belt    Activity Tolerance  Patient tolerated treatment well    Behavior During Therapy  [facility] for tasks assessed/performed;Impulsive       Past Medical History:  Diagnosis Date  . Convulsions/seizures ([place]) 07/05/2014  . COPD (chronic obstructive pulmonary disease) ([place])   . Depression   . Diabetes ([place])   . Gout   . Hypertension   . Memory disturbance 01/26/2013  . Prostate cancer ([place])   . RLS (restless legs syndrome)     Past Surgical History:  Procedure Laterality Date  . COLONOSCOPY  2009   JAN  . PROSTATECTOMY    . TONSILLECTOMY AND ADENOIDECTOMY      There were no vitals filed for this visit.  Subjective Assessment - 07/26/17 1109    Subjective  Pt reports no changes.  Wife reports no new falls over the weekend.     Patient is accompained by:  Family member    Pertinent History  HTN, seizures, COPD, depression, memory disturbance, RLS, hx of prostate CA, gout, DM    Limitations  Walking;House hold activities    Patient Stated Goals  Pt reports wanting to walk better and fall less     Currently in Pain?  No/denies                       [facility] Adult PT Treatment/Exercise - 07/26/17 1136      Ambulation/Gait   Ambulation/Gait  Yes    Ambulation/Gait Assistance  5: Supervision    Ambulation/Gait Assistance Details  Continue to work on ambulation outdoors over varying  surfaces with quad tip cane including transitons from grass to concrete, curbs to grass, curbs to conrete and also stepping over cement barriers.  Pt did markedly better today with cues.  Note that PT is seeing pt in the am vs pm when PT normally sees him.  Wife reports she sees this at home as well.  Provided education to do mobility tasks in the morning to avoid falls/LOB.      Ambulation Distance (Feet)  1100 Feet    Assistive device  Straight cane w/ quad tip attachment.     Gait Pattern  Step-through pattern;Narrow base of support    Ambulation Surface  Level;Unlevel;Indoor;Outdoor;Paved;Gravel;Grass    Stairs  Yes    Stairs Assistance  5: Supervision    Stairs Assistance Details (indicate cue type and reason)  Had pt perform stairs today with use of quad tip cane with single rail/cane.  Performed x 5 reps with R rail and cane in L hand.  Provided continuous cues for cane placement, however use of cane seemed to help him slow down on stairs and made pt safer with stair negotiation.  Discussed this with wife.      Stair Management Technique  One rail  Right;Alternating pattern;Forwards;With cane    Number of Stairs  4 x 5 reps    Height of Stairs  6    Ramp  5: Supervision    Ramp Details (indicate cue type and reason)  With use of cane with cues for safe use of cane.     Curb  5: Supervision    Curb Details (indicate cue type and reason)  Performed x 4 reps with use of quad tip cane with cues for sequencing and stepping through when both ascending and descending curb step.  Again, educated wife on how to cue pt during this task.        Self-Care   Self-Care  Other Self-Care Comments    Other Self-Care Comments   PT provided handout on adult day programs in [place] as well as resources offered by Agencies on aging adults including respite care.  Pts wife verbalized understanding.       Knee/Hip Exercises: Aerobic   Stepper  Level 1.5 with BUE/LE initially however due to increased pain in  R shoulder, had him rest RUE on lap and continue with task x 3 mins total.               PT Education - 07/26/17 2016    Education provided  Yes    Education Details  see self care    Person(s) Educated  Patient    Methods  Explanation    Comprehension  Verbalized understanding       PT Short Term Goals - 07/26/17 1111      PT SHORT TERM GOAL #1   Title  Pt will be mod I ( with wife's assistance) with initial HEP(ALL STGS TARGET DATE 07/19/2017)    Baseline  Not doing formal HEP that was given due to pts cognitive deficits, they are walking together most days.     Status  Partially Met      PT SHORT TERM GOAL #2   Title  Patient will reports 2 weeks without falls to demonstrate improved safety during functional mobility.    Baseline  07/19/17: had several falls at [place] over the weekend    Status  Not Met      PT SHORT TERM GOAL #3   Title  Patient will demonstrate 257f ambulation with S over even terrain, while performing scanning/cog task mild reduction in gait speed, to improve safety during functional mobililty.     Baseline  S level, however did have marked reduction in speed and mild instability.      Status  Partially Met      PT SHORT TERM GOAL #4   Title  Patient will demonstrate ramp, curb, and stair negotiation with 1 rail, supervision level to reduce risk of falls.    Baseline  met with mod/max cues for safe use of quad tip cane 07/26/17    Status  Achieved        PT Long Term Goals - 06/21/17 1654      PT LONG TERM GOAL #1   Title  Patient will be mod I (wife assistance for memory) with long term HEP/fitness program to maintain progress made during PT session. (ALL LTGS TARGET DATE  08/20/2017)    Status  New    Target Date  --      PT LONG TERM GOAL #2   Title  Patient will complete DGI with a score of >/=19/24 to reduce falls risk     Status  New  PT LONG TERM GOAL #3   Title  Patient will complete cognitive TUG with a reduction in time of</=4.0  seconds in order to reduces falls risk     Status  New      PT LONG TERM GOAL #4   Title  Patient will ambulate 552f outdoors over grass and pavement with LRAD and supervision level in order to further reduce fall risk in limited community ambulation.     Status  New            Plan - 07/26/17 2017    Clinical Impression Statement  PT provided pt/spouse with handouts regarding adult day services of [employer] also resources that Agencies on aging adults, including respite care.  Pts wife appreciative of information. Continue to focus on gait over outdoor transitions with quad tip cane.  Note improvement in mobility today (seen in am) vs previous sessions (seen in pm).      Rehab Potential  Good    Clinical Impairments Affecting Rehab Potential  Good support system     PT Frequency  2x / week    PT Treatment/Interventions  ADLs/Self Care Home Management;Stair training;Gait training;Functional mobility training;Therapeutic activities;Therapeutic exercise;Balance training;Neuromuscular re-education;Cognitive remediation;Patient/family education;Orthotic Fit/Training;Manual techniques;Passive range of motion;Energy conservation    PT Next Visit Plan  Follow up on adult day program/respite care with wife, continue balance and gait training - very functional due to Alzheimer's    Consulted and Agree with Plan of Care  Patient;Family member/caregiver    Family Member Consulted  wife       Patient will benefit from skilled therapeutic intervention in order to improve the following deficits and impairments:  Abnormal gait, Difficulty walking, Decreased safety awareness, Impaired UE functional use, Decreased activity tolerance, Decreased knowledge of precautions, Impaired perceived functional ability, Decreased balance, Decreased strength, Postural dysfunction  Visit Diagnosis: Other abnormalities of gait and mobility  Unsteadiness on feet  Repeated falls     Problem  List Patient Active Problem List   Diagnosis Date Noted  . Convulsions/seizures ([place] 07/05/2014  . Right shoulder pain 07/05/2014  . Memory disturbance 01/26/2013    ECameron Sprang PT, MPT [facility] [address]Phone: [phone]  Fax:  [phone]/20/19, 8:21 PM  Name: Lucas BIRCHERMRN: [id number] of Birth: 1Nov 14, 1952

## 2017-07-30 ENCOUNTER — Ambulatory Visit: Payer: Medicare Other | Admitting: Rehabilitation

## 2017-07-30 ENCOUNTER — Encounter: Payer: Self-pay | Admitting: Rehabilitation

## 2017-07-30 DIAGNOSIS — R2681 Unsteadiness on feet: Secondary | ICD-10-CM

## 2017-07-30 DIAGNOSIS — R296 Repeated falls: Secondary | ICD-10-CM | POA: Diagnosis not present

## 2017-07-30 DIAGNOSIS — Z9181 History of falling: Secondary | ICD-10-CM | POA: Diagnosis not present

## 2017-07-30 DIAGNOSIS — R2689 Other abnormalities of gait and mobility: Secondary | ICD-10-CM | POA: Diagnosis not present

## 2017-07-30 NOTE — Therapy (Signed)
[facility] [address] [place], [place], [address] Phone: [phone]   Fax:  [phone]  Physical Therapy Treatment  Patient Details  Name: Lucas Vega MRN: [id number] Date of Birth: Jul 28, 1950 Referring Provider: Kathrynn Ducking, MD   Encounter Date: 07/30/2017  PT End of Session - 07/30/17 1931    Visit Number  8    Number of Visits  17    Date for PT Re-Evaluation  08/20/17    PT Start Time  5573    PT Stop Time  1618    PT Time Calculation (min)  43 min    Equipment Utilized During Treatment  Gait belt    Activity Tolerance  Patient tolerated treatment well    Behavior During Therapy  [facility] for tasks assessed/performed;Impulsive       Past Medical History:  Diagnosis Date  . Convulsions/seizures ([place]) 07/05/2014  . COPD (chronic obstructive pulmonary disease) ([place])   . Depression   . Diabetes ([place])   . Gout   . Hypertension   . Memory disturbance 01/26/2013  . Prostate cancer ([place])   . RLS (restless legs syndrome)     Past Surgical History:  Procedure Laterality Date  . COLONOSCOPY  2009   JAN  . PROSTATECTOMY    . TONSILLECTOMY AND ADENOIDECTOMY      There were no vitals filed for this visit.  Subjective Assessment - 07/30/17 1537    Subjective  Reports no changes, no falls since last visit.     Patient is accompained by:  Family member    Pertinent History  HTN, seizures, COPD, depression, memory disturbance, RLS, hx of prostate CA, gout, DM    Limitations  Walking;House hold activities    Patient Stated Goals  Pt reports wanting to walk better and fall less     Currently in Pain?  No/denies                       [facility] Adult PT Treatment/Exercise - 07/30/17 0001      Neuro Re-ed    Neuro Re-ed Details   High level balance with emphasis on large steps and agility exercises for quick large steps; stepping stones x 7 reps (8sets) with min A and continuous cues for  large steps and landing on top on step vs the side.  Progressed to using agility ladder with forward stepping with two feet to square x 4 reps however this demo'd marked difficulty therefore progressed to single foot per square with improved technique x 6 reps, lateral stepping with two feet to square with emphasis on quick movements with high knees x 6 reps.  Continued with placing colored circles on floor far apart (large stride) walking forward hitting each foot to target x 6 sets of 10 targets, note that when making turns he tends to lose balance or cross feet over.  therefore continued to address making turns in figure 8 pattern with hula hoops on floor x 8 sets.  Note that with increased cues led to increased difficulty, therefore decreased cues.  All activities require min to mod A to prevent LOB.       Knee/Hip Exercises: Aerobic   Stepper  Level 1.5 with LUE/BLEs x 5 mins.              PT Education - 07/30/17 1537    Education provided  Yes    Education Details  purpose of exercises today  Person(s) Educated  Patient    Methods  Explanation    Comprehension  Verbalized understanding       PT Short Term Goals - 07/26/17 1111      PT SHORT TERM GOAL #1   Title  Pt will be mod I ( with wife's assistance) with initial HEP(ALL STGS TARGET DATE 07/19/2017)    Baseline  Not doing formal HEP that was given due to pts cognitive deficits, they are walking together most days.     Status  Partially Met      PT SHORT TERM GOAL #2   Title  Patient will reports 2 weeks without falls to demonstrate improved safety during functional mobility.    Baseline  07/19/17: had several falls at [place] over the weekend    Status  Not Met      PT SHORT TERM GOAL #3   Title  Patient will demonstrate 248f ambulation with S over even terrain, while performing scanning/cog task mild reduction in gait speed, to improve safety during functional mobililty.     Baseline  S level, however did have marked  reduction in speed and mild instability.      Status  Partially Met      PT SHORT TERM GOAL #4   Title  Patient will demonstrate ramp, curb, and stair negotiation with 1 rail, supervision level to reduce risk of falls.    Baseline  met with mod/max cues for safe use of quad tip cane 07/26/17    Status  Achieved        PT Long Term Goals - 06/21/17 1654      PT LONG TERM GOAL #1   Title  Patient will be mod I (wife assistance for memory) with long term HEP/fitness program to maintain progress made during PT session. (ALL LTGS TARGET DATE  08/20/2017)    Status  New    Target Date  --      PT LONG TERM GOAL #2   Title  Patient will complete DGI with a score of >/=19/24 to reduce falls risk     Status  New      PT LONG TERM GOAL #3   Title  Patient will complete cognitive TUG with a reduction in time of</=4.0 seconds in order to reduces falls risk     Status  New      PT LONG TERM GOAL #4   Title  Patient will ambulate 50105foutdoors over grass and pavement with LRAD and supervision level in order to further reduce fall risk in limited community ambulation.     Status  New            Plan - 07/30/17 1931    Clinical Impression Statement  Skilled session continues to focus on exercises and high level balance with emphasis on increased hip/knee flex and increased step length to carry over to gait.  Pt tolerated exercise very well today.     Rehab Potential  Good    Clinical Impairments Affecting Rehab Potential  Good support system     PT Frequency  2x / week    PT Treatment/Interventions  ADLs/Self Care Home Management;Stair training;Gait training;Functional mobility training;Therapeutic activities;Therapeutic exercise;Balance training;Neuromuscular re-education;Cognitive remediation;Patient/family education;Orthotic Fit/Training;Manual techniques;Passive range of motion;Energy conservation    PT Next Visit Plan  Follow up on adult day program/respite care with wife, continue  balance and gait training - very functional due to Alzheimer's    Consulted and Agree with Plan of Care  Patient;Family member/caregiver    Family Member Consulted  wife       Patient will benefit from skilled therapeutic intervention in order to improve the following deficits and impairments:  Abnormal gait, Difficulty walking, Decreased safety awareness, Impaired UE functional use, Decreased activity tolerance, Decreased knowledge of precautions, Impaired perceived functional ability, Decreased balance, Decreased strength, Postural dysfunction  Visit Diagnosis: Other abnormalities of gait and mobility  Unsteadiness on feet  Repeated falls     Problem List Patient Active Problem List   Diagnosis Date Noted  . Convulsions/seizures ([place]) 07/05/2014  . Right shoulder pain 07/05/2014  . Memory disturbance 01/26/2013   Cameron Sprang, PT, MPT [facility] [address] [place], [place], [address] Phone: [phone]   Fax:  [phone] 07/30/17, 7:34 PM  Name: JETT FUKUDA MRN: [id number] Date of Birth: 1950/07/24

## 2017-08-03 ENCOUNTER — Ambulatory Visit: Payer: Medicare Other

## 2017-08-06 ENCOUNTER — Ambulatory Visit: Payer: Medicare Other

## 2017-08-09 ENCOUNTER — Ambulatory Visit: Payer: Medicare Other | Admitting: Rehabilitation

## 2017-08-13 ENCOUNTER — Ambulatory Visit: Payer: Medicare Other

## 2017-08-16 ENCOUNTER — Ambulatory Visit: Payer: Medicare Other | Attending: Neurology | Admitting: Rehabilitation

## 2017-08-16 ENCOUNTER — Encounter: Payer: Self-pay | Admitting: Rehabilitation

## 2017-08-16 DIAGNOSIS — R296 Repeated falls: Secondary | ICD-10-CM | POA: Diagnosis not present

## 2017-08-16 DIAGNOSIS — R2681 Unsteadiness on feet: Secondary | ICD-10-CM | POA: Insufficient documentation

## 2017-08-16 DIAGNOSIS — R2689 Other abnormalities of gait and mobility: Secondary | ICD-10-CM

## 2017-08-16 NOTE — Therapy (Signed)
[facility] [address] [place], [place], [address] Phone: [phone]   Fax:  [phone]  Physical Therapy Treatment  Patient Details  Name: Lucas Vega MRN: [id number] Date of Birth: September 10, 1950 Referring Provider: Kathrynn Ducking, MD   Encounter Date: 08/16/2017  PT End of Session - 08/16/17 1627    Visit Number  9    Number of Visits  17    Date for PT Re-Evaluation  08/20/17    PT Start Time  1501 pt late to session    PT Stop Time  1530    PT Time Calculation (min)  29 min    Equipment Utilized During Treatment  Gait belt    Activity Tolerance  Patient tolerated treatment well    Behavior During Therapy  [facility] for tasks assessed/performed;Impulsive       Past Medical History:  Diagnosis Date  . Convulsions/seizures ([place]) 07/05/2014  . COPD (chronic obstructive pulmonary disease) ([place])   . Depression   . Diabetes ([place])   . Gout   . Hypertension   . Memory disturbance 01/26/2013  . Prostate cancer ([place])   . RLS (restless legs syndrome)     Past Surgical History:  Procedure Laterality Date  . COLONOSCOPY  2009   JAN  . PROSTATECTOMY    . TONSILLECTOMY AND ADENOIDECTOMY      There were no vitals filed for this visit.  Subjective Assessment - 08/16/17 1622    Subjective  Pt returns following two week break (went to [place] with family and were moving into another condo while house being renovated).  He had a very bad fall when moving out of apt.  Family was with him but he walked off curb and didn't step, fell on face with abrasion under eye, still has redness in eye.  They did not go to urgent care, but he seems to be ok.      Patient is accompained by:  Family member    Pertinent History  HTN, seizures, COPD, depression, memory disturbance, RLS, hx of prostate CA, gout, DM    Limitations  Walking;House hold activities    Patient Stated Goals  Pt reports wanting to walk better and fall less     Currently in Pain?  No/denies                       [facility] Adult PT Treatment/Exercise - 08/16/17 1520      Standardized Balance Assessment   Standardized Balance Assessment  Dynamic Gait Index;Timed Up and Go Test      Dynamic Gait Index   Level Surface  Mild Impairment    Change in Gait Speed  Moderate Impairment    Gait with Horizontal Head Turns  Mild Impairment    Gait with Vertical Head Turns  Severe Impairment    Gait and Pivot Turn  Mild Impairment    Step Over Obstacle  Moderate Impairment    Step Around Obstacles  Mild Impairment    Steps  Moderate Impairment    Total Score  11      Timed Up and Go Test   TUG  Normal TUG;Cognitive TUG    Normal TUG (seconds)  12.62 w/ quad tip cane    Cognitive TUG (seconds)  31.4 unable to do cog task with quad tip cane      Self-Care   Self-Care  Other Self-Care Comments    Other Self-Care Comments   Continue to  provide education about 24/7 supervision/assist needed, esp when walking outdoors.   Note that they are now in new condo that has many stairs to get into and then more stairs to get up to bedroom.  Wife reports that because of this, he has not been going outdoors at all by himself.  Feel that this is best due to very high fall risk.  Continue to educate that he needs hands on assist and cues for all transitions (gavel to pavement, curb steps, grass to pavement, etc) to avoid falls.  PT provided handout on Adult Enrichment center as well during session.  Wife seems very overwhelmed and feel that she needs respite care very badly.               PT Education - 08/16/17 1627    Education provided  Yes    Education Details  see self care    Person(s) Educated  Patient;Spouse    Methods  Explanation    Comprehension  Verbalized understanding       PT Short Term Goals - 07/26/17 1111      PT SHORT TERM GOAL #1   Title  Pt will be mod I ( with wife's assistance) with initial HEP(ALL STGS TARGET DATE  07/19/2017)    Baseline  Not doing formal HEP that was given due to pts cognitive deficits, they are walking together most days.     Status  Partially Met      PT SHORT TERM GOAL #2   Title  Patient will reports 2 weeks without falls to demonstrate improved safety during functional mobility.    Baseline  07/19/17: had several falls at [place] over the weekend    Status  Not Met      PT SHORT TERM GOAL #3   Title  Patient will demonstrate 270f ambulation with S over even terrain, while performing scanning/cog task mild reduction in gait speed, to improve safety during functional mobililty.     Baseline  S level, however did have marked reduction in speed and mild instability.      Status  Partially Met      PT SHORT TERM GOAL #4   Title  Patient will demonstrate ramp, curb, and stair negotiation with 1 rail, supervision level to reduce risk of falls.    Baseline  met with mod/max cues for safe use of quad tip cane 07/26/17    Status  Achieved        PT Long Term Goals - 08/16/17 1516      PT LONG TERM GOAL #1   Title  Patient will be mod I (wife assistance for memory) with long term HEP/fitness program to maintain progress made during PT session. (ALL LTGS TARGET DATE  08/20/2017)    Baseline  not fully compliant at this time due to moving from apt to condo and then to house to renovations    Status  Not Met      PT LONG TERM GOAL #2   Title  Patient will complete DGI with a score of >/=19/24 to reduce falls risk     Baseline  11/24 on 08/16/17    Status  Not Met      PT LONG TERM GOAL #3   Title  Patient will complete cognitive TUG with a reduction in time of</=4.0 seconds in order to reduces falls risk     Baseline  31.4 secs with quad tip cane which is an increase in time from eval  Status  Not Met      PT LONG TERM GOAL #4   Title  Patient will ambulate 580f outdoors over grass and pavement with LRAD and supervision level in order to further reduce fall risk in limited  community ambulation.     Status  New            Plan - 08/16/17 1627    Clinical Impression Statement  Skilled session began to assess LTGs as pt is not making progress due to cognitive deficits and PT continues to recommend 24/7 S and assist for all outdoor gait.  Pt has not met 3/4 LTGs checked today.  Will briefly go over HEP at next session and outdoor gait. Plan to keep pt on hold only to check in for gym program and respite resources.  Pt and wife verbalized understanding.     Rehab Potential  Good    Clinical Impairments Affecting Rehab Potential  Good support system     PT Frequency  2x / week    PT Treatment/Interventions  ADLs/Self Care Home Management;Stair training;Gait training;Functional mobility training;Therapeutic activities;Therapeutic exercise;Balance training;Neuromuscular re-education;Cognitive remediation;Patient/family education;Orthotic Fit/Training;Manual techniques;Passive range of motion;Energy conservation    PT Next Visit Plan  check HEP, LTG, D/C    Consulted and Agree with Plan of Care  Patient;Family member/caregiver    Family Member Consulted  wife       Patient will benefit from skilled therapeutic intervention in order to improve the following deficits and impairments:  Abnormal gait, Difficulty walking, Decreased safety awareness, Impaired UE functional use, Decreased activity tolerance, Decreased knowledge of precautions, Impaired perceived functional ability, Decreased balance, Decreased strength, Postural dysfunction  Visit Diagnosis: Other abnormalities of gait and mobility  Unsteadiness on feet  Repeated falls     Problem List Patient Active Problem List   Diagnosis Date Noted  . Convulsions[place]izures ([place] 07/05/2014  . Right shoulder pain 07/05/2014  . Memory disturbance 01/26/2013    ECameron Sprang PT, MPT [facility] [address]Phone: [phone]  Fax:   [phone]/10/19, 4:32 PM  Name: Lucas MORINIMRN: [id number] of Birth: 101-15-1952

## 2017-08-20 ENCOUNTER — Ambulatory Visit: Payer: Medicare Other | Admitting: Rehabilitation

## 2017-08-20 ENCOUNTER — Encounter: Payer: Self-pay | Admitting: Rehabilitation

## 2017-08-20 DIAGNOSIS — R2689 Other abnormalities of gait and mobility: Secondary | ICD-10-CM

## 2017-08-20 DIAGNOSIS — R296 Repeated falls: Secondary | ICD-10-CM

## 2017-08-20 DIAGNOSIS — R2681 Unsteadiness on feet: Secondary | ICD-10-CM | POA: Diagnosis not present

## 2017-08-20 NOTE — Patient Instructions (Signed)
Sit to Stand: Head Upright    With head upright, stand up slowly with eyes open. Do from your chair in the living room. Cross your arms and sit on the edge of chair.  Go slowly both ways.  Repeat __10__ times per session. Do __2_ sessions per day.  Copyright  VHI. All rights reserved.   Mini Squat: Double Leg    With feet shoulder width apart, reach forward for balance and do a mini squat. Keep knees in line with second toe. Knees do not go past toes.  Do this with your chair in the living room behind you so you know to aim your butt for chair.  Barely tap your bottom to chair and then return to stand.  Repeat _10__ times per set. Do __1_ sets per session.  http://plyo.exer.us/70   Copyright  VHI. All rights reserved.   FUNCTIONAL MOBILITY: Marching - Standing    March in place by lifting left leg up, then right. Alternate. Do this marching forward along counter top with hand on counter as needed for support.  Have him go really slow and lift knee as high as possible.   Do 4-6 laps in kitchen, do 2 times per day.    Copyright  VHI. All rights reserved.   FUNCTIONAL MOBILITY: Toe Walking    Walk forward on toes. _10__ reps per set, _2__ sets per day, _5-7__ days per week.  Do this along counter top in kitchen.  Do 4-6 laps, 2 times per day.   Copyright  VHI. All rights reserved.   FUNCTIONAL MOBILITY: Heel Walking    Walk forward on heels.  Do this along counter top at home for support as needed.   Do 4-6 laps along counter, 2 times per day.    Copyright  VHI. All rights reserved.

## 2017-08-21 NOTE — Therapy (Signed)
[facility] [address] [place], [place], [address] Phone: [phone]   Fax:  [phone]  Physical Therapy Treatment  Patient Details  Name: Lucas Vega MRN: [id number] Date of Birth: December 30, 1950 Referring Provider: Kathrynn Ducking, MD   Encounter Date: 08/20/2017  PT End of Session - 08/20/17 1540    Visit Number  10    Number of Visits  17    Date for PT Re-Evaluation  08/20/17    PT Start Time  5701    PT Stop Time  7793    PT Time Calculation (min)  40 min    Equipment Utilized During Treatment  Gait belt    Activity Tolerance  Patient tolerated treatment well    Behavior During Therapy  [facility] for tasks assessed/performed;Impulsive       Past Medical History:  Diagnosis Date  . Convulsions/seizures ([place]) 07/05/2014  . COPD (chronic obstructive pulmonary disease) ([place])   . Depression   . Diabetes ([place])   . Gout   . Hypertension   . Memory disturbance 01/26/2013  . Prostate cancer ([place])   . RLS (restless legs syndrome)     Past Surgical History:  Procedure Laterality Date  . COLONOSCOPY  2009   JAN  . PROSTATECTOMY    . TONSILLECTOMY AND ADENOIDECTOMY      There were no vitals filed for this visit.  Subjective Assessment - 08/20/17 1538    Subjective  Pt reports no changes since last visit, no falls.     Patient is accompained by:  Family member    Pertinent History  HTN, seizures, COPD, depression, memory disturbance, RLS, hx of prostate CA, gout, DM    Limitations  Walking;House hold activities    Patient Stated Goals  Pt reports wanting to walk better and fall less     Currently in Pain?  No/denies                       [facility] Adult PT Treatment/Exercise - 08/20/17 1600      Ambulation/Gait   Ambulation/Gait  Yes    Ambulation/Gait Assistance  4: Min guard;4: Min assist    Ambulation/Gait Assistance Details  Had pt ambulate outdoors with quad tip cane today during  session to address LTG.  PT provided min/guard to min A throughout for safety, esp when making transitons to grass/concrete or curb step.  Continued cues for larger steps, placement of cane, and upright posture.  Discussed this with wife during session that when outdoors, hands on assist is recommended for safety and to provide those same cues continuously.  Both verbalized understanding.      Ambulation Distance (Feet)  1200 Feet    Assistive device  Straight cane w/ quad tip    Gait Pattern  Step-through pattern;Narrow base of support    Ambulation Surface  Level;Unlevel;Indoor;Outdoor;Paved;Grass    Stairs  Yes    Stairs Assistance  5: Supervision;4: Min guard    Stairs Assistance Details (indicate cue type and reason)  Continue to address stairs as pts wife reports at home he does not place entire foot on step and this presents balance issues.  PT had pt perform with single rail and cane during session.  Note good placement of foot during session, but suggested cue of "hit foot to step" so that he places most of foot on step for safety.  Note that he descends in step to pattern which is of increased safety  and continue to encourage this.  Educated wife on where to assist/stand during stair negotiation.      Stair Management Technique  One rail Right;Alternating pattern;Step to pattern;Forwards;With cane    Number of Stairs  16    Height of Stairs  6    Curb  5: Supervision min/guard    Curb Details (indicate cue type and reason)  Cues to notify of curb      Self-Care   Self-Care  Other Self-Care Comments    Other Self-Care Comments   Discussed that PT would like to schedule one additonal follow up visit in one month to allow them to get settled back into their house and follow up with any gym questions, HEP questions.  Pt and wife verbalized understanding.        Therapeutic Activites    Therapeutic Activities  Other Therapeutic Activities    Other Therapeutic Activities  Went over floor  recovery in case of falls at home.  Educated wife on having him crawl to sturdy surface for support and having him not stand all the way but just enough to sit on surface.  Pt able to perform x 2 reps at S level during session.        Neuro Re-ed    Neuro Re-ed Details   Reviewed current HEP and provided another copy during session.  See pt instructions for details.               PT Education - 08/20/17 1540    Education provided  Yes    Education Details  see self care, floor recovery    Person(s) Educated  Patient    Methods  Explanation    Comprehension  Verbalized understanding       PT Short Term Goals - 07/26/17 1111      PT SHORT TERM GOAL #1   Title  Pt will be mod I ( with wife's assistance) with initial HEP(ALL STGS TARGET DATE 07/19/2017)    Baseline  Not doing formal HEP that was given due to pts cognitive deficits, they are walking together most days.     Status  Partially Met      PT SHORT TERM GOAL #2   Title  Patient will reports 2 weeks without falls to demonstrate improved safety during functional mobility.    Baseline  07/19/17: had several falls at [place] over the weekend    Status  Not Met      PT SHORT TERM GOAL #3   Title  Patient will demonstrate 270f ambulation with S over even terrain, while performing scanning/cog task mild reduction in gait speed, to improve safety during functional mobililty.     Baseline  S level, however did have marked reduction in speed and mild instability.      Status  Partially Met      PT SHORT TERM GOAL #4   Title  Patient will demonstrate ramp, curb, and stair negotiation with 1 rail, supervision level to reduce risk of falls.    Baseline  met with mod/max cues for safe use of quad tip cane 07/26/17    Status  Achieved        PT Long Term Goals - 08/21/17 0740      PT LONG TERM GOAL #1   Title  Patient will be mod I (wife assistance for memory) with long term HEP/fitness program to maintain progress made during PT  session. (ALL LTGS TARGET DATE  08/20/2017)  Baseline  not fully compliant at this time due to moving from apt to condo and then to house to renovations    Status  Not Met      PT LONG TERM GOAL #2   Title  Patient will complete DGI with a score of >/=19/24 to reduce falls risk     Baseline  11/24 on 08/16/17    Status  Not Met      PT LONG TERM GOAL #3   Title  Patient will complete cognitive TUG with a reduction in time of</=4.0 seconds in order to reduces falls risk     Baseline  31.4 secs with quad tip cane which is an increase in time from eval     Status  Not Met      PT LONG TERM GOAL #4   Title  Patient will ambulate 556f outdoors over grass and pavement with LRAD and supervision level in order to further reduce fall risk in limited community ambulation.     Baseline  Pt requires min/guard to min A for all outdoor gait    Status  Not Met      PT LONG TERM GOAL #5   Title  Pt/wife will demonstrate/verbalize understanding of HEP/gym program and have contacted respite servives/day program to decrease burden of care.     Time  1    Period  Months    Status  New    Target Date  09/20/17            Plan - 08/20/17 1540    Clinical Impression Statement  Checked remaining LTG for outdoor gait (did not meet goal as PT recommends min/guard to min A for outdoor gait) and reviewed HEP for them to perform at home until he can get to gym to do workouts with wife.  Recommended single follow up visit in one month to check in regarding gym/HEP and community resources.      Rehab Potential  Good    Clinical Impairments Affecting Rehab Potential  Good support system     PT Frequency  2x / week    PT Treatment/Interventions  ADLs/Self Care Home Management;Stair training;Gait training;Functional mobility training;Therapeutic activities;Therapeutic exercise;Balance training;Neuromuscular re-education;Cognitive remediation;Patient/family education;Orthotic Fit/Training;Manual  techniques;Passive range of motion;Energy conservation    PT Next Visit Plan  do re-cert and D/C to follow up on HEP/gym and respite services.     Consulted and Agree with Plan of Care  Patient;Family member/caregiver    Family Member Consulted  wife       Patient will benefit from skilled therapeutic intervention in order to improve the following deficits and impairments:  Abnormal gait, Difficulty walking, Decreased safety awareness, Impaired UE functional use, Decreased activity tolerance, Decreased knowledge of precautions, Impaired perceived functional ability, Decreased balance, Decreased strength, Postural dysfunction  Visit Diagnosis: Other abnormalities of gait and mobility  Unsteadiness on feet  Repeated falls     Problem List Patient Active Problem List   Diagnosis Date Noted  . Convulsions/seizures ([place] 07/05/2014  . Right shoulder pain 07/05/2014  . Memory disturbance 01/26/2013    ECameron Sprang PT, MPT CGl[facility]SSabana SecaGChignik [place] [address]Phone: [phone]  Fax:  [phone]/15/19, 7:47 AM  Name: WJULUIS FITZSIMMONSMRN: [id number] of Birth: [birth year]

## 2017-09-01 ENCOUNTER — Other Ambulatory Visit: Payer: Self-pay | Admitting: Neurology

## 2017-09-24 ENCOUNTER — Ambulatory Visit: Payer: Medicare Other | Admitting: Rehabilitation

## 2017-10-13 DIAGNOSIS — F322 Major depressive disorder, single episode, severe without psychotic features: Secondary | ICD-10-CM | POA: Diagnosis not present

## 2017-10-13 DIAGNOSIS — D692 Other nonthrombocytopenic purpura: Secondary | ICD-10-CM | POA: Diagnosis not present

## 2017-10-13 DIAGNOSIS — E46 Unspecified protein-calorie malnutrition: Secondary | ICD-10-CM | POA: Diagnosis not present

## 2017-10-13 DIAGNOSIS — E1129 Type 2 diabetes mellitus with other diabetic kidney complication: Secondary | ICD-10-CM | POA: Diagnosis not present

## 2017-10-13 DIAGNOSIS — I129 Hypertensive chronic kidney disease with stage 1 through stage 4 chronic kidney disease, or unspecified chronic kidney disease: Secondary | ICD-10-CM | POA: Diagnosis not present

## 2017-10-13 DIAGNOSIS — R319 Hematuria, unspecified: Secondary | ICD-10-CM | POA: Diagnosis not present

## 2017-10-13 DIAGNOSIS — G309 Alzheimer's disease, unspecified: Secondary | ICD-10-CM | POA: Diagnosis not present

## 2017-10-13 DIAGNOSIS — Z8546 Personal history of malignant neoplasm of prostate: Secondary | ICD-10-CM | POA: Diagnosis not present

## 2017-10-13 DIAGNOSIS — N182 Chronic kidney disease, stage 2 (mild): Secondary | ICD-10-CM | POA: Diagnosis not present

## 2017-10-13 DIAGNOSIS — E7800 Pure hypercholesterolemia, unspecified: Secondary | ICD-10-CM | POA: Diagnosis not present

## 2017-10-13 DIAGNOSIS — R569 Unspecified convulsions: Secondary | ICD-10-CM | POA: Diagnosis not present

## 2017-10-28 ENCOUNTER — Ambulatory Visit (INDEPENDENT_AMBULATORY_CARE_PROVIDER_SITE_OTHER): Payer: Medicare Other | Admitting: Neurology

## 2017-10-28 ENCOUNTER — Encounter: Payer: Self-pay | Admitting: Neurology

## 2017-10-28 ENCOUNTER — Other Ambulatory Visit: Payer: Self-pay | Admitting: Neurology

## 2017-10-28 VITALS — BP 120/78 | HR 69 | Ht 68.0 in

## 2017-10-28 DIAGNOSIS — R569 Unspecified convulsions: Secondary | ICD-10-CM | POA: Diagnosis not present

## 2017-10-28 DIAGNOSIS — R269 Unspecified abnormalities of gait and mobility: Secondary | ICD-10-CM | POA: Diagnosis not present

## 2017-10-28 DIAGNOSIS — M21372 Foot drop, left foot: Secondary | ICD-10-CM | POA: Diagnosis not present

## 2017-10-28 DIAGNOSIS — G3281 Cerebellar ataxia in diseases classified elsewhere: Secondary | ICD-10-CM

## 2017-10-28 HISTORY — DX: Foot drop, left foot: M21.372

## 2017-10-28 HISTORY — DX: Unspecified abnormalities of gait and mobility: R26.9

## 2017-10-28 NOTE — Patient Instructions (Signed)
We will get an AFO brace for the left foot for the foot drop and get MRI of the brain and neck.

## 2017-10-28 NOTE — Progress Notes (Addendum)
Reason for visit: Seizures, gait disturbance  Lucas Vega is an 66 y.o. male  History of present illness:  Lucas Vega is a 67 year old left-handed white male with a history of seizures and a history of a memory disturbance.  Sometime in February 2019, he had a significant change in his ability to ambulate.  The problems with walking has persisted, the patient has undergone physical therapy.  He is now walking with a walker, but he continues to fall frequently.  The patient has not had any further seizures.  He is on Depakote and is tolerating the medication fairly well.  He continues to drink alcohol, when he does consume alcohol his ability to walk significantly declines.  He does better with his gait stability in the morning.  He has a chronic lifelong history of neck pain without radiation down the arms.  He has had worsening problems with urinary incontinence, but he has a history of prostate cancer.  He wears adult diapers at night.  He returns to the office today for an evaluation.  He denies any numbness of the extremities.  Past Medical History:  Diagnosis Date  . Convulsions/seizures ([place]) 07/05/2014  . COPD (chronic obstructive pulmonary disease) ([place])   . Depression   . Diabetes ([place])   . Gait abnormality 10/28/2017  . Gout   . Hypertension   . Memory disturbance 01/26/2013  . Prostate cancer ([place])   . RLS (restless legs syndrome)     Past Surgical History:  Procedure Laterality Date  . COLONOSCOPY  2009   JAN  . PROSTATECTOMY    . TONSILLECTOMY AND ADENOIDECTOMY      Family History  Problem Relation Age of Onset  . Dementia Mother   . Pancreatic cancer Father   . Seizures Neg Hx     Social history:  reports that he quit smoking about 5 years ago. He has never used smokeless tobacco. He reports that he drinks alcohol. He reports that he does not use drugs.   No Known Allergies  Medications:  Prior to Admission medications   Medication Sig Start Date  End Date Taking? Authorizing Provider  allopurinol (ZYLOPRIM) 100 MG tablet Take 100 mg by mouth daily.   Yes [provider]  ALPRAZolam (XANAX) 0.25 MG tablet Take 0.25 mg by mouth 2 (two) times daily as needed for anxiety.    Yes [provider]  aspirin 81 MG tablet Take 81 mg by mouth daily.   Yes [provider]  atenolol-chlorthalidone (TENORETIC) 50-25 MG per tablet Take 1 tablet by mouth daily.    Yes [provider]  buPROPion (WELLBUTRIN) 75 MG tablet Take 75 mg by mouth daily.   Yes [provider]  divalproex (DEPAKOTE) 500 MG DR tablet TAKE 1 TABLET BY MOUTH TWICE DAILY 09/01/17  Yes Kathrynn Ducking, MD  KLOR-CON M20 20 MEQ tablet Take 20 mEq by mouth daily.  02/18/16  Yes [provider]  megestrol (MEGACE) 40 MG tablet Take 40 mg by mouth 2 (two) times daily.   Yes [provider]  memantine (NAMENDA) 10 MG tablet Take 1 tablet (10 mg total) by mouth 2 (two) times daily. 08/29/14  Yes Ward Givens, NP  simvastatin (ZOCOR) 20 MG tablet Take 20 mg by mouth daily.   Yes [provider]  venlafaxine XR (EFFEXOR XR) 75 MG 24 hr capsule Take 1 capsule (75 mg total) by mouth daily with breakfast. 03/04/17  Yes Kathrynn Ducking, MD  ROS:  Out of a complete 14 system review of symptoms, the patient complains only of the following symptoms, and all other reviewed systems are negative.  Decreased appetite, chills, weight loss Cold intolerance Daytime sleepiness Incontinence of the bladder, frequency of urination, urinary urgency Walking difficulty, neck pain Memory loss, weakness Agitation, confusion  Blood pressure 120/78, pulse 69, height 5\' 8"  (1.727 m), SpO2 98 %.  Physical Exam  General: The patient is alert and cooperative at the time of the examination.  Skin: No significant peripheral edema is noted.   Neurologic Exam  Mental status: The patient is alert and oriented x 3 at the time of the  examination. The Mini-Mental status examination done today shows a total score 22/30.   Cranial nerves: Facial symmetry is present. Speech is normal, no aphasia or dysarthria is noted. Extraocular movements are full. Visual fields are full.  Motor: The patient has good strength in all 4 extremities, with exception of a prominent left foot drop and possibly some weakness with external rotation of the arms bilaterally, right greater than left.  Sensory examination: Soft touch sensation is symmetric on the face, arms, and legs.  Coordination: The patient has good finger-nose-finger and heel-to-shin bilaterally.  Gait and station: The patient has a slightly wide-based gait, the patient walks with a walker.  He catches the left foot frequently when walking and may stumble.  Tandem gait is unsteady.  Romberg is negative.  Reflexes: Deep tendon reflexes are symmetric, reflexes are present throughout, possibly slightly brisk.   CT head 06/01/17:  IMPRESSION:  Abnormal CT scan of the head showing mild changes of chronic microvascular ischemia and generalized cerebral atrophy which appear to be slightly age disproportionate.Incidental left parietal scalp soft tissue swelling is noted which may represent scalp hematoma given patient's history of recurrent falls  * CT scan images were reviewed online. I agree with the written report.    Assessment/Plan:  1.  History of seizures, well controlled  2.  Gait disturbance, sudden onset  3.  History of alcohol overuse  4.  Left foot drop  The patient was given a prescription for an AFO brace for the left foot.  The patient continues to have ongoing problems with his walking, the etiology of this is not yet clear.  If anything, he may be slightly hyperreflexic throughout, he does have a chronic history of neck pain.  MRI of the brain and cervical spine will be done.  Prior blood work done was unremarkable.  He will remain on Depakote for now.  He will  follow-up in 6 months.  The patient will be given a prescription for a wheelchair for home use, the patient has a significant gait disorder at this time, the wheelchair will be needed for mobility and to ensure safety.  The patient has a high risk for falling.  Jill Alexanders MD 10/28/2017 11:08 AM  [facility] [address] [place], [place] [address]  Phone [phone] Fax [phone]

## 2017-11-01 ENCOUNTER — Telehealth: Payer: Self-pay | Admitting: Neurology

## 2017-11-01 NOTE — Telephone Encounter (Signed)
Medicare/[employer] order sent to [facility] They will reach out to the pt to schedule.

## 2017-11-07 ENCOUNTER — Ambulatory Visit
Admission: RE | Admit: 2017-11-07 | Discharge: 2017-11-07 | Disposition: A | Discharge status: Home / Self Care | Payer: Medicare Other | Source: Ambulatory Visit | Attending: Neurology | Admitting: Neurology

## 2017-11-07 DIAGNOSIS — R269 Unspecified abnormalities of gait and mobility: Secondary | ICD-10-CM | POA: Diagnosis not present

## 2017-11-07 DIAGNOSIS — G3281 Cerebellar ataxia in diseases classified elsewhere: Secondary | ICD-10-CM | POA: Diagnosis not present

## 2017-11-08 ENCOUNTER — Telehealth: Payer: Self-pay | Admitting: Neurology

## 2017-11-08 DIAGNOSIS — G959 Disease of spinal cord, unspecified: Secondary | ICD-10-CM

## 2017-11-08 NOTE — Telephone Encounter (Signed)
  I called the patient.  The MRI of the cervical spine shows evidence of spinal cord compression at the C3-4 level, this could be the source of his gait disturbance.  However, he also has ventriculomegaly and could potentially have normal pressure hydrocephalus as the cause of his gait problem and bladder control issues.  I would recommend a referral to a neurosurgeon, the spinal cord compression is a bit more urgent I believe.  We will follow his walking issues over time.  They are amenable to a neurosurgical referral, I will get this set up.  MRI cervical 11/08/17:  IMPRESSION: abnormal MRI scan of cervical spine showing prominent spondylitic changes throughout most severe at C3-4 where there is broad-based disc osteophyte protrusion resulting in severe cord compression and bilateral foraminal narrowing. There is mild posterior canal and moderate bilateral foraminal narrowing at C6-7 as well.   MRI brain 11/08/17:  IMPRESSION:  Abnormal MRI scan of the brain showing moderate degree of ventriculomegaly which appears to be slightly out of proportion to the degree of generalized cerebral atrophy noted. Overall the ventriculomegaly appears to be increased compared with previous MRI scan dated 05/31/2014.

## 2017-11-15 ENCOUNTER — Telehealth: Payer: Self-pay | Admitting: Neurology

## 2017-11-15 DIAGNOSIS — R269 Unspecified abnormalities of gait and mobility: Secondary | ICD-10-CM

## 2017-11-15 DIAGNOSIS — G959 Disease of spinal cord, unspecified: Secondary | ICD-10-CM

## 2017-11-15 NOTE — Telephone Encounter (Signed)
Pts wife Cam requesting a call back from Dr. Jannifer Franklin, stating she has a few concerns regarding the pts mental state. Did not wish top discuss further. Please advise

## 2017-11-15 NOTE — Telephone Encounter (Signed)
I called the patient.  The patient is still having a lot of falls, his cognitive abilities are worsening, he likely has a dementia, he has ventriculomegaly on MRI of the brain, not clear if he has a component of normal pressure hydrocephalus, but he does continue to drink alcohol regularly.  He has a neurosurgical referral concerning the spinal cord compression, we may need to pursue work-up for normal pressure hydrocephalus at some point once the neck issue is treated.  I will send in a prescription for a wheelchair to improve safety with mobility.

## 2017-11-16 ENCOUNTER — Telehealth: Payer: Self-pay | Admitting: *Deleted

## 2017-11-16 NOTE — Telephone Encounter (Signed)
Called wife. Advised we will send order for wheelchair to [facility] for them. She verbalized understanding. I gave her their contact info: [phone].

## 2017-11-16 NOTE — Telephone Encounter (Signed)
Faxed signed order for wheelchair to [facility] with office note for supporting documentation. Fax: [phone]. Received fax confirmation.

## 2017-11-16 NOTE — Telephone Encounter (Signed)
Faxed signed order back to [employer] re: AFO brace for left foot. Dx: foot drop. Daily use, length of need: lifetime. Fax: [phone].

## 2017-11-17 NOTE — Telephone Encounter (Signed)
Faxed back order sheet to [facility] re: safety accessories for wheelchair and Dr. Jannifer Franklin' NPI. Fax: [phone]. Received fax confirmation.

## 2017-11-30 ENCOUNTER — Other Ambulatory Visit: Payer: Self-pay | Admitting: Neurosurgery

## 2017-11-30 DIAGNOSIS — G959 Disease of spinal cord, unspecified: Secondary | ICD-10-CM | POA: Diagnosis not present

## 2017-12-07 NOTE — Pre-Procedure Instructions (Signed)
MARCIANO MUNDT  12/07/2017      [facility] - [place], [place] [address] Phone: [phone] Fax: [phone]    Your procedure is scheduled on October 7th.  Report to [facility] Admitting at 1000 A.M.  Call this number if you have problems the morning of surgery:  [phone]   Remember:  Do not eat or drink after midnight.    Take these medicines the morning of surgery with A SIP OF WATER   Allopurinol, Xanax, Wellbutrin, Depakote, Namenda, Effexor  7 days prior to surgery STOP taking any Aspirin(unless otherwise instructed by your surgeon), Aleve, Naproxen, Ibuprofen, Motrin, Advil, Goody's, BC's, all herbal medications, fish oil, and all vitamins     Do not wear jewelry  Do not wear lotions, powders, or colognes, or deodorant.  Do not shave 48 hours prior to surgery.  Men may shave face and neck.  Do not bring valuables to the hospital.  [facility] is not responsible for any belongings or valuables.  Contacts, dentures or bridgework may not be worn into surgery.  Leave your suitcase in the car.  After surgery it may be brought to your room.  For patients admitted to the hospital, discharge time will be determined by your treatment team.  Patients discharged the day of surgery will not be allowed to drive home.    [facility]- Preparing For Surgery  Before surgery, you can play an important role. Because skin is not sterile, your skin needs to be as free of germs as possible. You can reduce the number of germs on your skin by washing with CHG (chlorahexidine gluconate) Soap before surgery.  CHG is an antiseptic cleaner which kills germs and bonds with the skin to continue killing germs even after washing.    Oral Hygiene is also important to reduce your risk of infection.  Remember - BRUSH YOUR TEETH THE MORNING OF SURGERY WITH YOUR REGULAR TOOTHPASTE  Please do not use if you  have an allergy to CHG or antibacterial soaps. If your skin becomes reddened/irritated stop using the CHG.  Do not shave (including legs and underarms) for at least 48 hours prior to first CHG shower. It is OK to shave your face.  Please follow these instructions carefully.   1. Shower the NIGHT BEFORE SURGERY and the MORNING OF SURGERY with CHG.   2. If you chose to wash your hair, wash your hair first as usual with your normal shampoo.  3. After you shampoo, rinse your hair and body thoroughly to remove the shampoo.  4. Use CHG as you would any other liquid soap. You can apply CHG directly to the skin and wash gently with a scrungie or a clean washcloth.   5. Apply the CHG Soap to your body ONLY FROM THE NECK DOWN.  Do not use on open wounds or open sores. Avoid contact with your eyes, ears, mouth and genitals (private parts). Wash Face and genitals (private parts)  with your normal soap.  6. Wash thoroughly, paying special attention to the area where your surgery will be performed.  7. Thoroughly rinse your body with warm water from the neck down.  8. DO NOT shower/wash with your normal soap after using and rinsing off the CHG Soap.  9. Pat yourself dry with a CLEAN TOWEL.  10. Wear CLEAN PAJAMAS to bed the night before surgery, wear comfortable clothes the morning of  surgery  11. Place CLEAN SHEETS on your bed the night of your first shower and DO NOT SLEEP WITH PETS.    Day of Surgery:  Do not apply any deodorants/lotions.  Please wear clean clothes to the hospital/surgery center.   Remember to brush your teeth WITH YOUR REGULAR TOOTHPASTE.    Please read over the following fact sheets that you were given.

## 2017-12-08 ENCOUNTER — Encounter (HOSPITAL_COMMUNITY): Payer: Self-pay

## 2017-12-08 ENCOUNTER — Encounter (HOSPITAL_COMMUNITY)
Admission: RE | Admit: 2017-12-08 | Discharge: 2017-12-08 | Disposition: A | Discharge status: Home / Self Care | Payer: Medicare Other | Source: Ambulatory Visit | Attending: Neurosurgery | Admitting: Neurosurgery

## 2017-12-08 ENCOUNTER — Other Ambulatory Visit: Payer: Self-pay

## 2017-12-08 DIAGNOSIS — Z79899 Other long term (current) drug therapy: Secondary | ICD-10-CM | POA: Insufficient documentation

## 2017-12-08 DIAGNOSIS — Z7982 Long term (current) use of aspirin: Secondary | ICD-10-CM | POA: Insufficient documentation

## 2017-12-08 DIAGNOSIS — I129 Hypertensive chronic kidney disease with stage 1 through stage 4 chronic kidney disease, or unspecified chronic kidney disease: Secondary | ICD-10-CM | POA: Diagnosis not present

## 2017-12-08 DIAGNOSIS — Z87891 Personal history of nicotine dependence: Secondary | ICD-10-CM | POA: Insufficient documentation

## 2017-12-08 DIAGNOSIS — M109 Gout, unspecified: Secondary | ICD-10-CM | POA: Diagnosis not present

## 2017-12-08 DIAGNOSIS — G4733 Obstructive sleep apnea (adult) (pediatric): Secondary | ICD-10-CM | POA: Insufficient documentation

## 2017-12-08 DIAGNOSIS — Z87442 Personal history of urinary calculi: Secondary | ICD-10-CM | POA: Insufficient documentation

## 2017-12-08 DIAGNOSIS — I491 Atrial premature depolarization: Secondary | ICD-10-CM | POA: Diagnosis not present

## 2017-12-08 DIAGNOSIS — F329 Major depressive disorder, single episode, unspecified: Secondary | ICD-10-CM | POA: Insufficient documentation

## 2017-12-08 DIAGNOSIS — F0280 Dementia in other diseases classified elsewhere without behavioral disturbance: Secondary | ICD-10-CM | POA: Insufficient documentation

## 2017-12-08 DIAGNOSIS — G309 Alzheimer's disease, unspecified: Secondary | ICD-10-CM | POA: Diagnosis not present

## 2017-12-08 DIAGNOSIS — J449 Chronic obstructive pulmonary disease, unspecified: Secondary | ICD-10-CM | POA: Diagnosis not present

## 2017-12-08 DIAGNOSIS — Z8546 Personal history of malignant neoplasm of prostate: Secondary | ICD-10-CM | POA: Diagnosis not present

## 2017-12-08 DIAGNOSIS — Z01818 Encounter for other preprocedural examination: Secondary | ICD-10-CM | POA: Diagnosis not present

## 2017-12-08 DIAGNOSIS — G959 Disease of spinal cord, unspecified: Secondary | ICD-10-CM | POA: Diagnosis not present

## 2017-12-08 DIAGNOSIS — N189 Chronic kidney disease, unspecified: Secondary | ICD-10-CM | POA: Insufficient documentation

## 2017-12-08 DIAGNOSIS — Z86711 Personal history of pulmonary embolism: Secondary | ICD-10-CM | POA: Diagnosis not present

## 2017-12-08 HISTORY — DX: Pneumonia, unspecified organism: J18.9

## 2017-12-08 HISTORY — DX: Alzheimer's disease with early onset: G30.0

## 2017-12-08 HISTORY — DX: Sleep apnea, unspecified: G47.30

## 2017-12-08 HISTORY — DX: Personal history of pulmonary embolism: Z86.711

## 2017-12-08 HISTORY — DX: Chronic kidney disease, unspecified: N18.9

## 2017-12-08 HISTORY — DX: Prediabetes: R73.03

## 2017-12-08 HISTORY — DX: Personal history of urinary calculi: Z87.442

## 2017-12-08 LAB — CBC
HCT: 37.4 % — ABNORMAL LOW (ref 39.0–52.0)
Hemoglobin: 12.8 g/dL — ABNORMAL LOW (ref 13.0–17.0)
MCH: 34.5 pg — ABNORMAL HIGH (ref 26.0–34.0)
MCHC: 34.2 g/dL (ref 30.0–36.0)
MCV: 100.8 fL — AB (ref 78.0–100.0)
PLATELETS: 391 10*3/uL (ref 150–400)
RBC: 3.71 MIL/uL — AB (ref 4.22–5.81)
RDW: 12.3 % (ref 11.5–15.5)
WBC: 11.3 10*3/uL — AB (ref 4.0–10.5)

## 2017-12-08 LAB — BASIC METABOLIC PANEL
Anion gap: 11 (ref 5–15)
BUN: 18 mg/dL (ref 8–23)
CHLORIDE: 101 mmol/L (ref 98–111)
CO2: 25 mmol/L (ref 22–32)
Calcium: 9.5 mg/dL (ref 8.9–10.3)
Creatinine, Ser: 1.32 mg/dL — ABNORMAL HIGH (ref 0.61–1.24)
GFR calc non Af Amer: 54 mL/min — ABNORMAL LOW (ref >60)
Glucose, Bld: 142 mg/dL — ABNORMAL HIGH (ref 70–99)
Potassium: 2.8 mmol/L — ABNORMAL LOW (ref 3.5–5.1)
SODIUM: 137 mmol/L (ref 135–145)

## 2017-12-08 LAB — TYPE AND SCREEN
ABO/RH(D): AB POS
Antibody Screen: NEGATIVE

## 2017-12-08 LAB — ABO/RH: ABO/RH(D): AB POS

## 2017-12-08 LAB — SURGICAL PCR SCREEN
MRSA, PCR: NEGATIVE
Staphylococcus aureus: NEGATIVE

## 2017-12-08 NOTE — Pre-Procedure Instructions (Signed)
ARUSH GATLIFF  12/08/2017    Your procedure is scheduled on Monday, December 13, 2017 at 11:55 AM.   Report to [facility] Entrance "A" Admitting Office at 10:00 AM.   Call this number if you have problems the morning of surgery: [phone]   Questions prior to day of surgery, please call [phone] between 8 & 4 PM.   Remember:  Do not eat or drink after midnight Sunday, 12/12/17.    Take these medicines the morning of surgery with A SIP OF WATER: Alprazolam (Xanax), Atenolol-Chlorthalidone (Tenoretic), Divalproex (Depakote), Memantine (Namenda), Venlafaxine (Effexor XR)  Stop Aspirin as instructed by surgeon.   Do not use NSAIDS (Ibuprofen, Aleve, etc) or any other Aspirin products prior to surgery.   How to Manage Your Diabetes Before Surgery   Why is it important to control my blood sugar before and after surgery?   Improving blood sugar levels before and after surgery helps healing and can limit problems.  A way of improving blood sugar control is eating a healthy diet by:  - Eating less sugar and carbohydrates  - Increasing activity/exercise  - Talk with your doctor about reaching your blood sugar goals  High blood sugars (greater than 180 mg/dL) can raise your risk of infections and slow down your recovery so you will need to focus on controlling your diabetes during the weeks before surgery.  Make sure that the doctor who takes care of your diabetes knows about your planned surgery including the date and location.  How do I manage my blood sugars before surgery?   Check your blood sugar at least 4 times a day, 2 days before surgery to make sure that they are not too high or low.  Check your blood sugar the morning of your surgery when you wake up and every 2 hours until you get to the Short-Stay unit.  Treat a low blood sugar (less than 70 mg/dL) with 1/2 cup of clear juice (cranberry or apple), 4 glucose tablets, OR glucose gel.  Recheck blood  sugar in 15 minutes after treatment (to make sure it is greater than 70 mg/dL).  If blood sugar is not greater than 70 mg/dL on re-check, call [phone] for further instructions.   Report your blood sugar to the Short-Stay nurse when you get to Short-Stay.  References:  [facility], 2007 "How to Manage your Diabetes Before and After Surgery".    Do not wear jewelry  Do not wear lotions, powders, cologne or deodorant.  Men may shave face and neck.  Do not bring valuables to the hospital.  [facility] is not responsible for any belongings or valuables.  Contacts, dentures or bridgework may not be worn into surgery.  Leave your suitcase in the car.  After surgery it may be brought to your room.  For patients admitted to the hospital, discharge time will be determined by your treatment team.  Patients discharged the day of surgery will not be allowed to drive home.  [facility] - Preparing for Surgery  Before surgery, you can play an important role.  Because skin is not sterile, your skin needs to be as free of germs as possible.  You can reduce the number of germs on you skin by washing with CHG (chlorahexidine gluconate) soap before surgery.  CHG is an antiseptic cleaner which kills germs and bonds with the skin to continue killing germs even after washing.  Oral Hygiene is also important in reducing the  risk of infection.  Remember to brush your teeth with your regular toothpaste the morning of surgery.  Please DO NOT use if you have an allergy to CHG or antibacterial soaps.  If your skin becomes reddened/irritated stop using the CHG and inform your nurse when you arrive at Short Stay.  Do not shave (including legs and underarms) for at least 48 hours prior to the first CHG shower.  You may shave your face.  Please follow these instructions carefully:   1.  Shower with CHG Soap the night before surgery and the morning of Surgery.  2.  If you choose to wash  your hair, wash your hair first as usual with your normal shampoo.  3.  After you shampoo, rinse your hair and body thoroughly to remove the shampoo. 4.  Use CHG as you would any other liquid soap.  You can apply chg directly to the skin and wash gently with a      scrungie or washcloth.           5.  Apply the CHG Soap to your body ONLY FROM THE NECK DOWN.   Do not use on open wounds or open sores. Avoid contact with your eyes, ears, mouth and genitals (private parts).  Wash genitals (private parts) with your normal soap.  6.  Wash thoroughly, paying special attention to the area where your surgery will be performed.  7.  Thoroughly rinse your body with warm water from the neck down.  8.  DO NOT shower/wash with your normal soap after using and rinsing off the CHG Soap.  9.  Pat yourself dry with a clean towel.            10.  Wear clean pajamas.            11.  Place clean sheets on your bed the night of your first shower and do not sleep with pets.  Day of Surgery  Shower as above. Do not apply any lotions/deodorants the morning of surgery.   Please wear clean clothes to the hospital. Remember to brush your teeth with toothpaste.    Please read over the fact sheets that you were given.

## 2017-12-08 NOTE — Progress Notes (Signed)
Pt and wife here for PAT appt. Pt has early onset Alzheimer's. Wife is giving the medical history. Pt very pleasant, doesn't remember what he's here for. Per wife, pt does not have a cardiac history. States pt is pre-diabetic. States he took Metformin in the past but after weight loss does not have to take it anymore. They do not check his blood sugar at home and she doesn't remember a recent A1C. Will get one today.

## 2017-12-09 LAB — HEMOGLOBIN A1C
Hgb A1c MFr Bld: <4.2 % — ABNORMAL LOW (ref 4.8–5.6)
Mean Plasma Glucose: <74 mg/dL

## 2017-12-09 NOTE — Progress Notes (Addendum)
Anesthesia Chart Review:  Case:  [id number] Date/Time:  12/13/17 1141   Procedure:  ANTERIOR CERVICAL DECOMPRESSION/DISCECTOMY FUSION CERVICAL 3-CERVICAL 4 (N/A ) - ANTERIOR CERVICAL DECOMPRESSION/DISCECTOMY FUSION CERVICAL 3-CERVICAL 4   Anesthesia type:  General   Pre-op diagnosis:  MYELOPATHY   Location:  [facility] ROOM 20 / [facility]   Surgeon:  Kary Kos, MD      DISCUSSION:67 yo male former smoker. Pertinent hx includes HTN, COPD, Gout, Alzheimer dementia, Seizures (last one 1.5 yrs ago as of 12/08/17), Hx of PE, OSA not on CPAP, CKD.  Potassium 2.8 on PAT labs. Lorriane Shire at Dr. Windy Carina office notified,she said will discuss with Dr. Saintclair Halsted and either he or PCP will address. Will order istat4 DOS.  Reports LD ASA 12/04/17.  Anticipate can proceed as planned if DOS labs acceptable.   VS: BP 108/80   Pulse 79   Temp 36.7 C   Resp 20   Ht 5\' 9"  (1.753 m)   Wt 71.5 kg   SpO2 99%   BMI 23.29 kg/m   PROVIDERS: Mayra Neer, MD is PCP  Margette Fast, MD is Neurologist  LABS: Labs reviewed: Acceptable for surgery. Hypokalemia. Dr. Windy Carina office notified. Elevated creatinine c/w pt hx of CKD. istat4 DOS. (all labs ordered are listed, but only abnormal results are displayed)  Labs Reviewed  BASIC METABOLIC PANEL - Abnormal; Notable for the following components:      Result Value   Potassium 2.8 (*)    Glucose, Bld 142 (*)    Creatinine, Ser 1.32 (*)    GFR calc non Af Amer 54 (*)    All other components within normal limits  CBC - Abnormal; Notable for the following components:   WBC 11.3 (*)    RBC 3.71 (*)    Hemoglobin 12.8 (*)    HCT 37.4 (*)    MCV 100.8 (*)    MCH 34.5 (*)    All other components within normal limits  SURGICAL PCR SCREEN  HEMOGLOBIN A1C  TYPE AND SCREEN  ABO/RH     IMAGES: MRI C-spine 11/07/2017: IMPRESSION: abnormal MRI scan of cervical spine showing prominent spondylitic changes throughout most severe at C3-4 where there is broad-based disc  osteophyte protrusion resulting in severe cord compression and bilateral foraminal narrowing. There is mild posterior canal and moderate bilateral foraminal narrowing at C6-7 as well.  EKG: 12/08/2017: Probable Sinus rhythm with PACs. Nonspecific ST and T wave abnormality 02/13/2015 (outside record, copy on pt chart): Sinus bradycardia 58bpm  CV: N/A   Past Medical History:  Diagnosis Date  . Alzheimer's disease with early onset (CODE) ([place]) 2015  . Chronic kidney disease   . Convulsions/seizures ([place]) 07/05/2014   last one 1 1/2 years ago as of 12/08/17  . COPD (chronic obstructive pulmonary disease) ([place])   . Depression   . Gait abnormality 10/28/2017  . Gout   . History of kidney stones   . History of pulmonary embolism    after surgery ? 2004  . Hypertension   . Left foot drop 10/28/2017  . Memory disturbance 01/26/2013  . Pneumonia    as a child  . Pre-diabetes    took Metformin for a while, but after weight loss did not have take it anymore  . Prostate cancer ([place])   . RLS (restless legs syndrome)   . Sleep apnea    "mild case" - tried cpap but he couldn't use it.    Past Surgical History:  Procedure Laterality Date  .  COLONOSCOPY  2009   JAN  . PROSTATECTOMY    . TONSILLECTOMY AND ADENOIDECTOMY      MEDICATIONS: . allopurinol (ZYLOPRIM) 100 MG tablet  . ALPRAZolam (XANAX) 0.25 MG tablet  . aspirin 81 MG tablet  . atenolol-chlorthalidone (TENORETIC) 50-25 MG per tablet  . buPROPion (WELLBUTRIN) 75 MG tablet  . divalproex (DEPAKOTE) 500 MG DR tablet  . KLOR-CON M20 20 MEQ tablet  . megestrol (MEGACE) 40 MG tablet  . memantine (NAMENDA) 10 MG tablet  . simvastatin (ZOCOR) 20 MG tablet  . venlafaxine XR (EFFEXOR-XR) 75 MG 24 hr capsule   No current facility-administered medications for this encounter.      Wynonia Musty [facility] Short Stay Center/Anesthesiology Phone [phone] 12/09/2017 10:16 AM

## 2017-12-13 ENCOUNTER — Encounter (HOSPITAL_COMMUNITY): Payer: Self-pay

## 2017-12-13 ENCOUNTER — Ambulatory Visit (HOSPITAL_COMMUNITY): Payer: Medicare Other | Admitting: Physician Assistant

## 2017-12-13 ENCOUNTER — Inpatient Hospital Stay (HOSPITAL_COMMUNITY)
Admission: RE | Admit: 2017-12-13 | Discharge: 2017-12-14 | DRG: 472 | Disposition: A | Discharge status: Home Health | Payer: Medicare Other | Attending: Neurosurgery | Admitting: Neurosurgery

## 2017-12-13 ENCOUNTER — Encounter (HOSPITAL_COMMUNITY)
Admission: RE | Disposition: A | Discharge status: Home Health | Payer: Self-pay | Source: Home / Self Care | Attending: Neurosurgery

## 2017-12-13 ENCOUNTER — Ambulatory Visit (HOSPITAL_COMMUNITY): Payer: Medicare Other

## 2017-12-13 DIAGNOSIS — S14103A Unspecified injury at C3 level of cervical spinal cord, initial encounter: Secondary | ICD-10-CM | POA: Diagnosis not present

## 2017-12-13 DIAGNOSIS — Z87442 Personal history of urinary calculi: Secondary | ICD-10-CM

## 2017-12-13 DIAGNOSIS — J449 Chronic obstructive pulmonary disease, unspecified: Secondary | ICD-10-CM | POA: Diagnosis present

## 2017-12-13 DIAGNOSIS — N189 Chronic kidney disease, unspecified: Secondary | ICD-10-CM | POA: Diagnosis present

## 2017-12-13 DIAGNOSIS — M4802 Spinal stenosis, cervical region: Secondary | ICD-10-CM | POA: Diagnosis present

## 2017-12-13 DIAGNOSIS — R262 Difficulty in walking, not elsewhere classified: Secondary | ICD-10-CM | POA: Diagnosis present

## 2017-12-13 DIAGNOSIS — Z800 Family history of malignant neoplasm of digestive organs: Secondary | ICD-10-CM

## 2017-12-13 DIAGNOSIS — Z79899 Other long term (current) drug therapy: Secondary | ICD-10-CM

## 2017-12-13 DIAGNOSIS — M2578 Osteophyte, vertebrae: Secondary | ICD-10-CM | POA: Diagnosis not present

## 2017-12-13 DIAGNOSIS — F329 Major depressive disorder, single episode, unspecified: Secondary | ICD-10-CM | POA: Diagnosis present

## 2017-12-13 DIAGNOSIS — Z8546 Personal history of malignant neoplasm of prostate: Secondary | ICD-10-CM

## 2017-12-13 DIAGNOSIS — M542 Cervicalgia: Secondary | ICD-10-CM | POA: Diagnosis not present

## 2017-12-13 DIAGNOSIS — M5001 Cervical disc disorder with myelopathy,  high cervical region: Secondary | ICD-10-CM | POA: Diagnosis present

## 2017-12-13 DIAGNOSIS — Z86711 Personal history of pulmonary embolism: Secondary | ICD-10-CM

## 2017-12-13 DIAGNOSIS — I129 Hypertensive chronic kidney disease with stage 1 through stage 4 chronic kidney disease, or unspecified chronic kidney disease: Secondary | ICD-10-CM | POA: Diagnosis present

## 2017-12-13 DIAGNOSIS — M109 Gout, unspecified: Secondary | ICD-10-CM | POA: Diagnosis not present

## 2017-12-13 DIAGNOSIS — M4712 Other spondylosis with myelopathy, cervical region: Secondary | ICD-10-CM | POA: Diagnosis present

## 2017-12-13 DIAGNOSIS — M4322 Fusion of spine, cervical region: Secondary | ICD-10-CM | POA: Diagnosis not present

## 2017-12-13 DIAGNOSIS — Z7982 Long term (current) use of aspirin: Secondary | ICD-10-CM

## 2017-12-13 DIAGNOSIS — G959 Disease of spinal cord, unspecified: Secondary | ICD-10-CM | POA: Diagnosis present

## 2017-12-13 DIAGNOSIS — Z87891 Personal history of nicotine dependence: Secondary | ICD-10-CM | POA: Diagnosis not present

## 2017-12-13 DIAGNOSIS — Z419 Encounter for procedure for purposes other than remedying health state, unspecified: Secondary | ICD-10-CM

## 2017-12-13 DIAGNOSIS — G309 Alzheimer's disease, unspecified: Secondary | ICD-10-CM | POA: Diagnosis present

## 2017-12-13 DIAGNOSIS — G2581 Restless legs syndrome: Secondary | ICD-10-CM | POA: Diagnosis present

## 2017-12-13 DIAGNOSIS — G4730 Sleep apnea, unspecified: Secondary | ICD-10-CM | POA: Diagnosis present

## 2017-12-13 DIAGNOSIS — S14104A Unspecified injury at C4 level of cervical spinal cord, initial encounter: Secondary | ICD-10-CM | POA: Diagnosis not present

## 2017-12-13 DIAGNOSIS — F0280 Dementia in other diseases classified elsewhere without behavioral disturbance: Secondary | ICD-10-CM | POA: Diagnosis present

## 2017-12-13 DIAGNOSIS — M21372 Foot drop, left foot: Secondary | ICD-10-CM | POA: Diagnosis present

## 2017-12-13 HISTORY — PX: ANTERIOR CERVICAL DECOMP/DISCECTOMY FUSION: SHX1161

## 2017-12-13 LAB — GLUCOSE, CAPILLARY: Glucose-Capillary: 141 mg/dL — ABNORMAL HIGH (ref 70–99)

## 2017-12-13 LAB — POCT I-STAT 4, (NA,K, GLUC, HGB,HCT)
GLUCOSE: 111 mg/dL — AB (ref 70–99)
HEMATOCRIT: 34 % — AB (ref 39.0–52.0)
Hemoglobin: 11.6 g/dL — ABNORMAL LOW (ref 13.0–17.0)
Potassium: 3 mmol/L — ABNORMAL LOW (ref 3.5–5.1)
Sodium: 138 mmol/L (ref 135–145)

## 2017-12-13 SURGERY — ANTERIOR CERVICAL DECOMPRESSION/DISCECTOMY FUSION 1 LEVEL
Anesthesia: General

## 2017-12-13 MED ORDER — LIDOCAINE 2% (20 MG/ML) 5 ML SYRINGE
INTRAMUSCULAR | Status: AC
  Filled 2017-12-13: qty 5

## 2017-12-13 MED ORDER — HYDROMORPHONE HCL 1 MG/ML IJ SOLN: 0.5000 mg | INTRAMUSCULAR | Status: DC | PRN

## 2017-12-13 MED ORDER — FENTANYL CITRATE (PF) 250 MCG/5ML IJ SOLN
INTRAMUSCULAR | Status: AC
  Filled 2017-12-13: qty 5

## 2017-12-13 MED ORDER — MEGESTROL ACETATE 40 MG PO TABS
40.0000 mg | ORAL_TABLET | Freq: Every day | ORAL | Status: DC
  Administered 2017-12-14: 40 mg via ORAL
  Filled 2017-12-13 (×2): qty 1

## 2017-12-13 MED ORDER — SODIUM CHLORIDE 0.9% FLUSH: 3.0000 mL | INTRAVENOUS | Status: DC | PRN

## 2017-12-13 MED ORDER — ACETAMINOPHEN 650 MG RE SUPP: 650.0000 mg | RECTAL | Status: DC | PRN

## 2017-12-13 MED ORDER — CHLORTHALIDONE 25 MG PO TABS
25.0000 mg | ORAL_TABLET | Freq: Every day | ORAL | Status: DC
  Administered 2017-12-13: 25 mg via ORAL
  Filled 2017-12-13: qty 1

## 2017-12-13 MED ORDER — ALLOPURINOL 100 MG PO TABS
100.0000 mg | ORAL_TABLET | Freq: Every day | ORAL | Status: DC
  Administered 2017-12-13 – 2017-12-14 (×2): 100 mg via ORAL
  Filled 2017-12-13 (×2): qty 1

## 2017-12-13 MED ORDER — CEFAZOLIN SODIUM-DEXTROSE 2-4 GM/100ML-% IV SOLN
INTRAVENOUS | Status: AC
  Filled 2017-12-13: qty 100

## 2017-12-13 MED ORDER — HYDROCODONE-ACETAMINOPHEN 5-325 MG PO TABS
1.0000 | ORAL_TABLET | ORAL | Status: DC | PRN
  Administered 2017-12-13: 2 via ORAL
  Administered 2017-12-13: 1 via ORAL
  Administered 2017-12-14: 2 via ORAL
  Filled 2017-12-13: qty 1
  Filled 2017-12-13 (×2): qty 2

## 2017-12-13 MED ORDER — OXYCODONE HCL 5 MG PO TABS: 10.0000 mg | ORAL_TABLET | ORAL | Status: DC | PRN

## 2017-12-13 MED ORDER — CHLORHEXIDINE GLUCONATE CLOTH 2 % EX PADS: 6.0000 | MEDICATED_PAD | Freq: Once | CUTANEOUS | Status: DC

## 2017-12-13 MED ORDER — ONDANSETRON HCL 4 MG/2ML IJ SOLN: 4.0000 mg | Freq: Once | INTRAMUSCULAR | Status: DC | PRN

## 2017-12-13 MED ORDER — BUPROPION HCL 75 MG PO TABS
75.0000 mg | ORAL_TABLET | Freq: Every day | ORAL | Status: DC
  Administered 2017-12-14: 75 mg via ORAL
  Filled 2017-12-13 (×2): qty 1

## 2017-12-13 MED ORDER — SIMVASTATIN 20 MG PO TABS
20.0000 mg | ORAL_TABLET | Freq: Every evening | ORAL | Status: DC
  Administered 2017-12-13: 20 mg via ORAL
  Filled 2017-12-13: qty 1

## 2017-12-13 MED ORDER — DEXAMETHASONE SODIUM PHOSPHATE 10 MG/ML IJ SOLN
INTRAMUSCULAR | Status: AC
  Filled 2017-12-13: qty 1

## 2017-12-13 MED ORDER — 0.9 % SODIUM CHLORIDE (POUR BTL) OPTIME
TOPICAL | Status: DC | PRN
  Administered 2017-12-13: 1000 mL

## 2017-12-13 MED ORDER — GLYCOPYRROLATE PF 0.2 MG/ML IJ SOSY
PREFILLED_SYRINGE | INTRAMUSCULAR | Status: DC | PRN
  Administered 2017-12-13: 0.4 mg via INTRAVENOUS

## 2017-12-13 MED ORDER — ASPIRIN EC 81 MG PO TBEC
81.0000 mg | DELAYED_RELEASE_TABLET | Freq: Every day | ORAL | Status: DC
  Administered 2017-12-13 – 2017-12-14 (×2): 81 mg via ORAL
  Filled 2017-12-13 (×3): qty 1

## 2017-12-13 MED ORDER — THROMBIN 5000 UNITS EX SOLR
OROMUCOSAL | Status: DC | PRN
  Administered 2017-12-13: 12:00:00 via TOPICAL

## 2017-12-13 MED ORDER — PANTOPRAZOLE SODIUM 40 MG PO TBEC
40.0000 mg | DELAYED_RELEASE_TABLET | Freq: Every day | ORAL | Status: DC
  Administered 2017-12-13 – 2017-12-14 (×2): 40 mg via ORAL
  Filled 2017-12-13 (×2): qty 1

## 2017-12-13 MED ORDER — MENTHOL 3 MG MT LOZG
1.0000 | LOZENGE | OROMUCOSAL | Status: DC | PRN
  Filled 2017-12-13: qty 9

## 2017-12-13 MED ORDER — ATENOLOL 25 MG PO TABS
50.0000 mg | ORAL_TABLET | Freq: Every day | ORAL | Status: DC
  Administered 2017-12-13: 50 mg via ORAL
  Filled 2017-12-13: qty 2

## 2017-12-13 MED ORDER — ONDANSETRON HCL 4 MG PO TABS: 4.0000 mg | ORAL_TABLET | Freq: Four times a day (QID) | ORAL | Status: DC | PRN

## 2017-12-13 MED ORDER — PHENOL 1.4 % MT LIQD: 1.0000 | OROMUCOSAL | Status: DC | PRN

## 2017-12-13 MED ORDER — HEMOSTATIC AGENTS (NO CHARGE) OPTIME
TOPICAL | Status: DC | PRN
  Administered 2017-12-13: 1 via TOPICAL

## 2017-12-13 MED ORDER — LIDOCAINE 2% (20 MG/ML) 5 ML SYRINGE
INTRAMUSCULAR | Status: DC | PRN
  Administered 2017-12-13: 80 mg via INTRAVENOUS

## 2017-12-13 MED ORDER — ACETAMINOPHEN 500 MG PO TABS
ORAL_TABLET | ORAL | Status: AC
  Filled 2017-12-13: qty 1

## 2017-12-13 MED ORDER — FENTANYL CITRATE (PF) 100 MCG/2ML IJ SOLN
25.0000 ug | INTRAMUSCULAR | Status: DC | PRN
  Administered 2017-12-13: 25 ug via INTRAVENOUS

## 2017-12-13 MED ORDER — ATENOLOL-CHLORTHALIDONE 50-25 MG PO TABS: 1.0000 | ORAL_TABLET | Freq: Every day | ORAL | Status: DC

## 2017-12-13 MED ORDER — DIVALPROEX SODIUM 500 MG PO DR TAB
500.0000 mg | DELAYED_RELEASE_TABLET | Freq: Two times a day (BID) | ORAL | Status: DC
  Administered 2017-12-13 – 2017-12-14 (×2): 500 mg via ORAL
  Filled 2017-12-13 (×2): qty 1

## 2017-12-13 MED ORDER — GLYCOPYRROLATE PF 0.2 MG/ML IJ SOSY
PREFILLED_SYRINGE | INTRAMUSCULAR | Status: AC
  Filled 2017-12-13: qty 1

## 2017-12-13 MED ORDER — POTASSIUM CHLORIDE CRYS ER 20 MEQ PO TBCR
20.0000 meq | EXTENDED_RELEASE_TABLET | Freq: Two times a day (BID) | ORAL | Status: DC
  Administered 2017-12-13 – 2017-12-14 (×2): 20 meq via ORAL
  Filled 2017-12-13 (×2): qty 1

## 2017-12-13 MED ORDER — ONDANSETRON HCL 4 MG/2ML IJ SOLN: 4.0000 mg | Freq: Four times a day (QID) | INTRAMUSCULAR | Status: DC | PRN

## 2017-12-13 MED ORDER — MEMANTINE HCL 10 MG PO TABS
10.0000 mg | ORAL_TABLET | Freq: Two times a day (BID) | ORAL | Status: DC
  Administered 2017-12-13 – 2017-12-14 (×2): 10 mg via ORAL
  Filled 2017-12-13 (×2): qty 1

## 2017-12-13 MED ORDER — FENTANYL CITRATE (PF) 100 MCG/2ML IJ SOLN
INTRAMUSCULAR | Status: DC | PRN
  Administered 2017-12-13: 100 ug via INTRAVENOUS
  Administered 2017-12-13: 50 ug via INTRAVENOUS

## 2017-12-13 MED ORDER — ONDANSETRON HCL 4 MG/2ML IJ SOLN
INTRAMUSCULAR | Status: AC
  Filled 2017-12-13: qty 2

## 2017-12-13 MED ORDER — DEXAMETHASONE SODIUM PHOSPHATE 10 MG/ML IJ SOLN
INTRAMUSCULAR | Status: DC | PRN
  Administered 2017-12-13: 10 mg via INTRAVENOUS

## 2017-12-13 MED ORDER — THROMBIN 5000 UNITS EX SOLR
CUTANEOUS | Status: DC | PRN
  Administered 2017-12-13: 10000 [IU] via TOPICAL

## 2017-12-13 MED ORDER — SODIUM CHLORIDE 0.9% FLUSH: 3.0000 mL | Freq: Two times a day (BID) | INTRAVENOUS | Status: DC

## 2017-12-13 MED ORDER — SODIUM CHLORIDE 0.9 % IV SOLN
INTRAVENOUS | Status: DC | PRN
  Administered 2017-12-13: 12:00:00

## 2017-12-13 MED ORDER — THROMBIN 5000 UNITS EX SOLR
CUTANEOUS | Status: AC
  Filled 2017-12-13: qty 15000

## 2017-12-13 MED ORDER — LACTATED RINGERS IV SOLN
INTRAVENOUS | Status: DC
  Administered 2017-12-13 (×2): via INTRAVENOUS

## 2017-12-13 MED ORDER — VENLAFAXINE HCL ER 75 MG PO CP24
75.0000 mg | ORAL_CAPSULE | Freq: Every day | ORAL | Status: DC
  Administered 2017-12-14: 75 mg via ORAL
  Filled 2017-12-13: qty 1

## 2017-12-13 MED ORDER — SODIUM CHLORIDE 0.9 % IV SOLN
INTRAVENOUS | Status: DC | PRN
  Administered 2017-12-13: 50 ug/min via INTRAVENOUS

## 2017-12-13 MED ORDER — ALPRAZOLAM 0.25 MG PO TABS
0.2500 mg | ORAL_TABLET | Freq: Two times a day (BID) | ORAL | Status: DC
  Administered 2017-12-13 – 2017-12-14 (×2): 0.25 mg via ORAL
  Filled 2017-12-13 (×2): qty 1

## 2017-12-13 MED ORDER — FENTANYL CITRATE (PF) 100 MCG/2ML IJ SOLN
INTRAMUSCULAR | Status: AC
  Filled 2017-12-13: qty 2

## 2017-12-13 MED ORDER — ACETAMINOPHEN 325 MG PO TABS
ORAL_TABLET | ORAL | Status: AC
  Administered 2017-12-13: 650 mg via ORAL
  Filled 2017-12-13: qty 2

## 2017-12-13 MED ORDER — ALUM & MAG HYDROXIDE-SIMETH 200-200-20 MG/5ML PO SUSP: 30.0000 mL | Freq: Four times a day (QID) | ORAL | Status: DC | PRN

## 2017-12-13 MED ORDER — PROPOFOL 10 MG/ML IV BOLUS
INTRAVENOUS | Status: DC | PRN
  Administered 2017-12-13: 130 mg via INTRAVENOUS

## 2017-12-13 MED ORDER — ACETAMINOPHEN 325 MG PO TABS
650.0000 mg | ORAL_TABLET | ORAL | Status: DC | PRN
  Administered 2017-12-13: 650 mg via ORAL

## 2017-12-13 MED ORDER — CYCLOBENZAPRINE HCL 10 MG PO TABS
10.0000 mg | ORAL_TABLET | Freq: Three times a day (TID) | ORAL | Status: DC | PRN
  Administered 2017-12-13: 10 mg via ORAL
  Filled 2017-12-13: qty 1

## 2017-12-13 MED ORDER — ONDANSETRON HCL 4 MG/2ML IJ SOLN
INTRAMUSCULAR | Status: DC | PRN
  Administered 2017-12-13: 4 mg via INTRAVENOUS

## 2017-12-13 MED ORDER — NEOSTIGMINE METHYLSULFATE 5 MG/5ML IV SOSY
PREFILLED_SYRINGE | INTRAVENOUS | Status: DC | PRN
  Administered 2017-12-13: 3 mg via INTRAVENOUS

## 2017-12-13 MED ORDER — CEFAZOLIN SODIUM-DEXTROSE 2-4 GM/100ML-% IV SOLN
2.0000 g | INTRAVENOUS | Status: AC
  Administered 2017-12-13: 2 g via INTRAVENOUS

## 2017-12-13 MED ORDER — NEOSTIGMINE METHYLSULFATE 3 MG/3ML IV SOSY
PREFILLED_SYRINGE | INTRAVENOUS | Status: AC
  Filled 2017-12-13: qty 6

## 2017-12-13 MED ORDER — ROCURONIUM BROMIDE 10 MG/ML (PF) SYRINGE
PREFILLED_SYRINGE | INTRAVENOUS | Status: DC | PRN
  Administered 2017-12-13: 40 mg via INTRAVENOUS
  Administered 2017-12-13: 10 mg via INTRAVENOUS

## 2017-12-13 MED ORDER — ROCURONIUM BROMIDE 50 MG/5ML IV SOSY
PREFILLED_SYRINGE | INTRAVENOUS | Status: AC
  Filled 2017-12-13: qty 5

## 2017-12-13 MED ORDER — PROPOFOL 10 MG/ML IV BOLUS
INTRAVENOUS | Status: AC
  Filled 2017-12-13: qty 20

## 2017-12-13 MED ORDER — CEFAZOLIN SODIUM-DEXTROSE 2-4 GM/100ML-% IV SOLN
2.0000 g | Freq: Three times a day (TID) | INTRAVENOUS | Status: AC
  Administered 2017-12-13 – 2017-12-14 (×2): 2 g via INTRAVENOUS
  Filled 2017-12-13 (×2): qty 100

## 2017-12-13 SURGICAL SUPPLY — 65 items
BAG DECANTER FOR FLEXI CONT (MISCELLANEOUS) ×3 IMPLANT
BASKET BONE COLLECTION (BASKET) ×3 IMPLANT
BENZOIN TINCTURE PRP APPL 2/3 (GAUZE/BANDAGES/DRESSINGS) ×3 IMPLANT
BIT DRILL NEURO 2X3.1 SFT TUCH (MISCELLANEOUS) ×1 IMPLANT
BONE VIVIGEN FORMABLE 1.3CC (Bone Implant) ×3 IMPLANT
BUR MATCHSTICK NEURO 3.0 LAGG (BURR) ×3 IMPLANT
CANISTER SUCT 3000ML PPV (MISCELLANEOUS) ×3 IMPLANT
CARTRIDGE OIL MAESTRO DRILL (MISCELLANEOUS) ×1 IMPLANT
CLOSURE WOUND 1/2 X4 (GAUZE/BANDAGES/DRESSINGS) ×1
COVER WAND RF STERILE (DRAPES) ×3 IMPLANT
DERMABOND ADVANCED (GAUZE/BANDAGES/DRESSINGS) ×2
DERMABOND ADVANCED .7 DNX12 (GAUZE/BANDAGES/DRESSINGS) ×1 IMPLANT
DIFFUSER DRILL AIR PNEUMATIC (MISCELLANEOUS) ×3 IMPLANT
DRAPE C-ARM 42X72 X-RAY (DRAPES) ×6 IMPLANT
DRAPE LAPAROTOMY 100X72 PEDS (DRAPES) ×3 IMPLANT
DRAPE MICROSCOPE LEICA (MISCELLANEOUS) ×3 IMPLANT
DRILL NEURO 2X3.1 SOFT TOUCH (MISCELLANEOUS) ×3
DRSG OPSITE POSTOP 4X6 (GAUZE/BANDAGES/DRESSINGS) ×3 IMPLANT
DURAPREP 6ML APPLICATOR 50/CS (WOUND CARE) ×3 IMPLANT
ELECT COATED BLADE 2.86 ST (ELECTRODE) ×3 IMPLANT
ELECT REM PT RETURN 9FT ADLT (ELECTROSURGICAL) ×3
ELECTRODE REM PT RTRN 9FT ADLT (ELECTROSURGICAL) ×1 IMPLANT
GAUZE 4X4 16PLY RFD (DISPOSABLE) IMPLANT
GAUZE SPONGE 4X4 12PLY STRL (GAUZE/BANDAGES/DRESSINGS) ×3 IMPLANT
GLOVE BIO SURGEON STRL SZ7 (GLOVE) IMPLANT
GLOVE BIO SURGEON STRL SZ8 (GLOVE) ×3 IMPLANT
GLOVE BIOGEL PI IND STRL 7.0 (GLOVE) IMPLANT
GLOVE BIOGEL PI IND STRL 7.5 (GLOVE) ×3 IMPLANT
GLOVE BIOGEL PI INDICATOR 7.0 (GLOVE)
GLOVE BIOGEL PI INDICATOR 7.5 (GLOVE) ×6
GLOVE ECLIPSE 7.0 STRL STRAW (GLOVE) ×3 IMPLANT
GLOVE EXAM NITRILE LRG STRL (GLOVE) IMPLANT
GLOVE EXAM NITRILE XL STR (GLOVE) IMPLANT
GLOVE EXAM NITRILE XS STR PU (GLOVE) IMPLANT
GLOVE INDICATOR 8.5 STRL (GLOVE) ×3 IMPLANT
GOWN STRL REUS W/ TWL LRG LVL3 (GOWN DISPOSABLE) ×1 IMPLANT
GOWN STRL REUS W/ TWL XL LVL3 (GOWN DISPOSABLE) ×2 IMPLANT
GOWN STRL REUS W/TWL 2XL LVL3 (GOWN DISPOSABLE) ×3 IMPLANT
GOWN STRL REUS W/TWL LRG LVL3 (GOWN DISPOSABLE) ×2
GOWN STRL REUS W/TWL XL LVL3 (GOWN DISPOSABLE) ×4
HALTER HD/CHIN CERV TRACTION D (MISCELLANEOUS) ×3 IMPLANT
HEMOSTAT POWDER KIT SURGIFOAM (HEMOSTASIS) ×3 IMPLANT
KIT BASIN OR (CUSTOM PROCEDURE TRAY) ×3 IMPLANT
KIT TURNOVER KIT B (KITS) ×3 IMPLANT
NEEDLE HYPO 18GX1.5 BLUNT FILL (NEEDLE) ×3 IMPLANT
NEEDLE SPNL 20GX3.5 QUINCKE YW (NEEDLE) ×3 IMPLANT
NS IRRIG 1000ML POUR BTL (IV SOLUTION) ×6 IMPLANT
OIL CARTRIDGE MAESTRO DRILL (MISCELLANEOUS) ×3
PACK LAMINECTOMY NEURO (CUSTOM PROCEDURE TRAY) ×3 IMPLANT
PAD ARMBOARD 7.5X6 YLW CONV (MISCELLANEOUS) ×9 IMPLANT
PIN DISTRACTION 14MM (PIN) IMPLANT
PLATE ANT CERV XTEND 1 LV 14 (Plate) ×3 IMPLANT
RUBBERBAND STERILE (MISCELLANEOUS) ×6 IMPLANT
SCREW VAR 4.2 XD SELF DRILL 14 (Screw) ×12 IMPLANT
SPACER COLONIAL ACDF TPS 8 LG (Spacer) ×3 IMPLANT
SPONGE INTESTINAL PEANUT (DISPOSABLE) ×3 IMPLANT
SPONGE SURGIFOAM ABS GEL SZ50 (HEMOSTASIS) ×3 IMPLANT
STRIP CLOSURE SKIN 1/2X4 (GAUZE/BANDAGES/DRESSINGS) ×2 IMPLANT
SUT VIC AB 3-0 SH 8-18 (SUTURE) ×3 IMPLANT
SUT VICRYL 4-0 PS2 18IN ABS (SUTURE) ×3 IMPLANT
SYR CONTROL 10ML LL (SYRINGE) ×3 IMPLANT
TAPE CLOTH 4X10 WHT NS (GAUZE/BANDAGES/DRESSINGS) IMPLANT
TOWEL GREEN STERILE (TOWEL DISPOSABLE) ×3 IMPLANT
TOWEL GREEN STERILE FF (TOWEL DISPOSABLE) ×3 IMPLANT
WATER STERILE IRR 1000ML POUR (IV SOLUTION) ×3 IMPLANT

## 2017-12-13 NOTE — H&P (Signed)
Lucas Vega is an 67 y.o. male.   Chief Complaint: Neck pain difficulty walking numbness in his hands left-sided foot drop HPI: 67 year old gentleman with neck pain difficulty ambulating numbness and tingling in his hands work-up has revealed severe spinal cord compression from a large disc herniation osteophyte at C3-4 with signal change within his cord.  He has a clinical picture consistent with myelopathy.  Due to his progression of clinical syndrome imaging findings and failed conservative treatment I recommended anterior cervical discectomy and fusion at C3-4.  I have extensively gone over the risks and benefits of the operation with him as well as perioperative course expectations of outcome and alternatives of surgery and he understands and agrees to proceed forward.  Past Medical History:  Diagnosis Date  . Alzheimer's disease with early onset (CODE) ([place]) 2015  . Chronic kidney disease   . Convulsions/seizures ([place]) 07/05/2014   last one 1 1/2 years ago as of 12/08/17  . COPD (chronic obstructive pulmonary disease) ([place])   . Depression   . Gait abnormality 10/28/2017  . Gout   . History of kidney stones   . History of pulmonary embolism    after surgery ? 2004  . Hypertension   . Left foot drop 10/28/2017  . Memory disturbance 01/26/2013  . Pneumonia    as a child  . Pre-diabetes    took Metformin for a while, but after weight loss did not have take it anymore  . Prostate cancer ([place])   . RLS (restless legs syndrome)   . Sleep apnea    "mild case" - tried cpap but he couldn't use it.    Past Surgical History:  Procedure Laterality Date  . COLONOSCOPY  2009   JAN  . PROSTATECTOMY    . TONSILLECTOMY AND ADENOIDECTOMY      Family History  Problem Relation Age of Onset  . Dementia Mother   . Pancreatic cancer Father   . Seizures Neg Hx    Social History:  reports that he quit smoking about 5 years ago. He has never used smokeless tobacco. He reports that he drinks  alcohol. He reports that he does not use drugs.  Allergies: No Known Allergies  Medications Prior to Admission  Medication Sig Dispense Refill  . allopurinol (ZYLOPRIM) 100 MG tablet Take 100 mg by mouth daily.    Marland Kitchen ALPRAZolam (XANAX) 0.25 MG tablet Take 0.25 mg by mouth 2 (two) times daily.     Marland Kitchen aspirin 81 MG tablet Take 81 mg by mouth daily.    Marland Kitchen atenolol-chlorthalidone (TENORETIC) 50-25 MG per tablet Take 1 tablet by mouth at bedtime.     Marland Kitchen buPROPion (WELLBUTRIN) 75 MG tablet Take 75 mg by mouth daily.    . divalproex (DEPAKOTE) 500 MG DR tablet TAKE 1 TABLET BY MOUTH TWICE DAILY (Patient taking differently: Take 500 mg by mouth 2 (two) times daily. ) 60 tablet 3  . KLOR-CON M20 20 MEQ tablet Take 20 mEq by mouth 2 (two) times daily.     . megestrol (MEGACE) 40 MG tablet Take 40 mg by mouth daily.     . memantine (NAMENDA) 10 MG tablet Take 1 tablet (10 mg total) by mouth 2 (two) times daily. 60 tablet 5  . simvastatin (ZOCOR) 20 MG tablet Take 20 mg by mouth every evening.     . venlafaxine XR (EFFEXOR-XR) 75 MG 24 hr capsule TAKE 1 CAPSULE BY MOUTH ONCE DAILY WITH BREAKFAST (Patient taking differently: Take 75 mg  by mouth daily with breakfast. ) 90 capsule 3    Results for orders placed or performed during the hospital encounter of 12/13/17 (from the past 48 hour(s))  Glucose, capillary     Status: Abnormal   Collection Time: 12/13/17 10:12 AM  Result Value Ref Range   Glucose-Capillary 141 (H) 70 - 99 mg/dL   Comment 1 Notify RN    Comment 2 Document in Chart   I-STAT 4, (NA,K, GLUC, HGB,HCT)     Status: Abnormal   Collection Time: 12/13/17 10:37 AM  Result Value Ref Range   Sodium 138 135 - 145 mmol/L   Potassium 3.0 (L) 3.5 - 5.1 mmol/L   Glucose, Bld 111 (H) 70 - 99 mg/dL   HCT 34.0 (L) 39.0 - 52.0 %   Hemoglobin 11.6 (L) 13.0 - 17.0 g/dL   No results found.  Review of Systems  Musculoskeletal: Positive for neck pain.    Blood pressure (!) 149/82, pulse 69,  temperature 97.9 F (36.6 C), temperature source Oral, resp. rate 20, height 5\' 9"  (1.753 m), weight 70.3 kg, SpO2 100 %. Physical Exam  Constitutional: He is oriented to person, place, and time. He appears well-developed and well-nourished.  HENT:  Head: Normocephalic.  Eyes: Pupils are equal, round, and reactive to light.  Neck: Normal range of motion.  GI: Soft. Bowel sounds are normal.  Neurological: He is alert and oriented to person, place, and time. He has normal strength. GCS eye subscore is 4. GCS verbal subscore is 5. GCS motor subscore is 6.  Strength 4+ out of 5 bilateral hand intrinsics and 4+ out of 5 triceps otherwise 5 out of 5 patient is hyperreflexic  Skin: Skin is dry.     Assessment/Plan 67 year old gentleman presents for an ACDF at C3-4  Jere Bostrom P, MD 12/13/2017, 11:15 AM

## 2017-12-13 NOTE — Anesthesia Preprocedure Evaluation (Signed)
Anesthesia Evaluation  Patient identified by MRN, date of birth, ID band Patient awake    Reviewed: Allergy & Precautions, NPO status , Patient's Chart, lab work & pertinent test results, reviewed documented beta blocker date and time   Airway Mallampati: II  TM Distance: <3 FB Neck ROM: Full    Dental  (+) Dental Advisory Given, Missing, Partial Upper,    Pulmonary sleep apnea , COPD, former smoker,    Pulmonary exam normal breath sounds clear to auscultation       Cardiovascular hypertension, Pt. on home beta blockers and Pt. on medications Normal cardiovascular exam Rhythm:Regular Rate:Normal     Neuro/Psych Seizures -, Well Controlled,  PSYCHIATRIC DISORDERS Depression Dementia    GI/Hepatic negative GI ROS, Neg liver ROS,   Endo/Other  negative endocrine ROS  Renal/GU Renal InsufficiencyRenal disease     Musculoskeletal negative musculoskeletal ROS (+)   Abdominal   Peds  Hematology  (+) Blood dyscrasia, anemia ,   Anesthesia Other Findings Day of surgery medications reviewed with the patient.  Reproductive/Obstetrics                            Anesthesia Physical Anesthesia Plan  ASA: III  Anesthesia Plan: General   Post-op Pain Management:    Induction: Intravenous  PONV Risk Score and Plan: 2 and Dexamethasone, Ondansetron and Treatment may vary due to age or medical condition  Airway Management Planned: Oral ETT  Additional Equipment:   Intra-op Plan:   Post-operative Plan: Extubation in OR  Informed Consent: I have reviewed the patients History and Physical, chart, labs and discussed the procedure including the risks, benefits and alternatives for the proposed anesthesia with the patient or authorized representative who has indicated his/her understanding and acceptance.   Dental advisory given  Plan Discussed with: CRNA  Anesthesia Plan Comments:          Anesthesia Quick Evaluation

## 2017-12-13 NOTE — Transfer of Care (Signed)
Immediate Anesthesia Transfer of Care Note  Patient: Lucas Vega  Procedure(s) Performed: ANTERIOR CERVICAL DECOMPRESSION/DISCECTOMY FUSION CERVICAL THREE-CERVICAL FOUR (N/A )  Patient Location: PACU  Anesthesia Type:General  Level of Consciousness: patient cooperative and responds to stimulation  Airway & Oxygen Therapy: Patient Spontanous Breathing and Patient connected to nasal cannula oxygen  Post-op Assessment: Report given to RN, Post -op Vital signs reviewed and stable and Patient moving all extremities X 4  Post vital signs: Reviewed and stable  Last Vitals:  Vitals Value Taken Time  BP 118/95 12/13/2017  2:05 PM  Temp 36.5 C 12/13/2017  2:05 PM  Pulse 73 12/13/2017  2:12 PM  Resp 17 12/13/2017  2:12 PM  SpO2 99 % 12/13/2017  2:12 PM  Vitals shown include unvalidated device data.  Last Pain:  Vitals:   12/13/17 1055  TempSrc:   PainSc: 0-No pain      Patients Stated Pain Goal: 2 (16/10/96 0454)  Complications: No apparent anesthesia complications

## 2017-12-13 NOTE — Anesthesia Postprocedure Evaluation (Signed)
Anesthesia Post Note  Patient: Lucas Vega  Procedure(s) Performed: ANTERIOR CERVICAL DECOMPRESSION/DISCECTOMY FUSION CERVICAL THREE-CERVICAL FOUR (N/A )     Patient location during evaluation: PACU Anesthesia Type: General Level of consciousness: awake and alert, awake and oriented Pain management: pain level controlled Vital Signs Assessment: post-procedure vital signs reviewed and stable Respiratory status: spontaneous breathing, nonlabored ventilation, respiratory function stable and patient connected to nasal cannula oxygen Cardiovascular status: blood pressure returned to baseline and stable Postop Assessment: no apparent nausea or vomiting Anesthetic complications: no    Last Vitals:  Vitals:   12/13/17 1405 12/13/17 1420  BP: (!) 118/95 127/85  Pulse: 74 72  Resp: 13 16  Temp: 36.5 C   SpO2: 100% 100%    Last Pain:  Vitals:   12/13/17 1530  TempSrc:   PainSc: 3                  Catalina Gravel

## 2017-12-13 NOTE — Progress Notes (Signed)
Call to Dr. Gifford Shave, relative to I-stat K+. No new orders

## 2017-12-13 NOTE — Op Note (Signed)
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 Consuella Lose  Anesthesia: General  EBL: Minimal  HPI: Patient very pleasant 67 year old gentleman is uppercase worsening neck pain bilateral shoulder pain numbness tingling weakness in his hands difficulty walking.  Work-up revealed a large spondylitic spur causing severe cord compression with signal change in his cord at C3-4.  Due to patient's progressive clinical syndrome imaging findings and failed conservative treatment I recommended anterior cervical discectomy fusion at that level.  I extensively went over the risks and benefits of the operation with him as well as perioperative course expectations of outcome and alternatives of surgery and he understood and agreed to proceed forward.  Operative procedure: Patient brought into the ER was induced under general anesthesia positioned supine the neck in mild amount of extension right-sided neck was prepped and draped in routine sterile fashion.  Preoperative x-ray localized the appropriate level so a curvilinear incision was made just off midline to the end of part of the sternocleido mastoid and the superficial layer of platysma was dissected out divided longitudinally.  The avascular plane between the sternomastoid and strap muscles was developed down to the previous fashion pretty vastus with Kitners.  Intraoperative x-ray confirmed identification appropriate level there is a very large anterior osteophyte this was dissected free I had identified native C3 vertebral body however the ossified extended down below 4 and the 4 5 disc  space so I left that alone.  Identified the disc space drilled in the disc space remove the anterior ossified L3 and work through the space under microscopic illumination drilling down the disc space capturing the bone shavings and mucus trap.  And then under MAC scopic illumination identified the posterior spondylitic ridge drilled this down to a thin layer aggressively under bit and remove the posterior longitudinal ligament piecemeal fashion.  Identified the thecal sac aggressively decompressed centrally with aggressive undermining of both endplates marching laterally both C4 pedicles were identifiable C4 nerve roots were decompressed.  Then contoured the endplates and selected an 8 mm cage packed with autograft mix and then inserted under fluoroscopy to make sure I got behind the vertebral body endplates and ventral or correction dorsal to the osteophyte.  Then aspect removed I contoured a plate and all screws excellent purchase.  Postop imaging showed good position of all implants.  Oozing copious irrigated to Kassim states was maintained.  The wound was then reapproximated layers with interrupted Vicryl skin was closed running 4 subcuticular Dermabond benzoin Steri-Strips and a sterile dressing was applied and patient recovery room in stable condition.  At the end the case on the account sponge counts were correct.

## 2017-12-14 ENCOUNTER — Encounter (HOSPITAL_COMMUNITY): Payer: Self-pay | Admitting: Neurosurgery

## 2017-12-14 DIAGNOSIS — M542 Cervicalgia: Secondary | ICD-10-CM | POA: Diagnosis not present

## 2017-12-14 DIAGNOSIS — M5001 Cervical disc disorder with myelopathy,  high cervical region: Secondary | ICD-10-CM | POA: Diagnosis not present

## 2017-12-14 DIAGNOSIS — R262 Difficulty in walking, not elsewhere classified: Secondary | ICD-10-CM | POA: Diagnosis not present

## 2017-12-14 DIAGNOSIS — M4712 Other spondylosis with myelopathy, cervical region: Secondary | ICD-10-CM | POA: Diagnosis not present

## 2017-12-14 DIAGNOSIS — M21372 Foot drop, left foot: Secondary | ICD-10-CM | POA: Diagnosis not present

## 2017-12-14 DIAGNOSIS — M2578 Osteophyte, vertebrae: Secondary | ICD-10-CM | POA: Diagnosis not present

## 2017-12-14 DIAGNOSIS — M4802 Spinal stenosis, cervical region: Secondary | ICD-10-CM | POA: Diagnosis not present

## 2017-12-14 MED ORDER — HYDROCODONE-ACETAMINOPHEN 5-325 MG PO TABS: 1.0000 | ORAL_TABLET | ORAL | 0 refills | Status: DC | PRN

## 2017-12-14 MED ORDER — CYCLOBENZAPRINE HCL 10 MG PO TABS: 10.0000 mg | ORAL_TABLET | Freq: Three times a day (TID) | ORAL | 0 refills | Status: DC | PRN

## 2017-12-14 NOTE — Discharge Summary (Signed)
Physician Discharge Summary  Patient ID: Lucas Vega MRN: [id number] DOB/AGE: [id number] y.o.  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 The patient tolerated the procedure well and was taken to the recovery room and then to the floor in stable condition. The hospital course was routine. There were no complications. The wound remained clean dry and intact. Pt had appropriate neck soreness. No complaints of arm pain or new N/T/W. The patient remained afebrile with stable vital signs, and tolerated a regular diet. The patient continued to increase activities, and pain was well controlled with oral pain medications.   Consults: None  Significant Diagnostic Studies:  Results for orders placed or performed during the hospital encounter of 12/13/17  Glucose, capillary  Result Value Ref Range   Glucose-Capillary 141 (H) 70 - 99 mg/dL   Comment 1 Notify RN    Comment 2 Document in Chart   I-STAT 4, (NA,K, GLUC, HGB,HCT)  Result Value Ref Range   Sodium 138 135 - 145 mmol/L   Potassium 3.0 (L) 3.5 - 5.1 mmol/L   Glucose, Bld 111 (H) 70 - 99 mg/dL   HCT 34.0 (L) 39.0 - 52.0 %   Hemoglobin 11.6 (L) 13.0 - 17.0 g/dL    Dg Cervical Spine 1 View  Result Date: 12/13/2017 CLINICAL DATA:  C3-4 cervical fusion. EXAM: DG CERVICAL SPINE - 1 VIEW; DG C-ARM 61-120 MIN FLUOROSCOPY TIME:  10 seconds. COMPARISON:  None. FINDINGS: Single intraoperative fluoroscopic image of the cervical spine demonstrates the patient be status post surgical anterior fusion of C3-4. Good alignment of visualized vertebral bodies is noted. IMPRESSION: Status post surgical anterior fusion of C3-4. Electronically Signed   By: Marijo Conception, M.D.   On: 12/13/2017 14:11   Dg C-arm 1-60 Min  Result Date: 12/13/2017 CLINICAL DATA:  C3-4 cervical fusion. EXAM: DG CERVICAL SPINE - 1 VIEW; DG C-ARM 61-120 MIN FLUOROSCOPY TIME:  10 seconds. COMPARISON:  None. FINDINGS: Single intraoperative fluoroscopic image of the cervical spine demonstrates the patient be status post surgical anterior fusion of C3-4. Good alignment of visualized vertebral bodies is noted. IMPRESSION: Status post surgical anterior fusion of C3-4. Electronically Signed   By: Marijo Conception, M.D.   On: 12/13/2017 14:11    Antibiotics:  Anti-infectives (From admission, onward)   Start     Dose/Rate Route Frequency Ordered Stop   12/13/17 2000  ceFAZolin (ANCEF) IVPB 2g/100 mL premix     2 g 200 mL/hr over 30 Minutes Intravenous Every 8 hours 12/13/17 1634 12/14/17 0506   12/13/17 1217  bacitracin 50,000 Units in sodium chloride 0.9 % 500 mL irrigation  Status:  Discontinued       As needed 12/13/17 1218 12/13/17 1400   12/13/17 1200  ceFAZolin (ANCEF) IVPB 2g/100 mL premix     2 g 200 mL/hr over 30 Minutes Intravenous On call to O.R. 12/13/17 1019 12/13/17 1139   12/13/17 1026  ceFAZolin (ANCEF) 2-4 GM/100ML-% IVPB    Note to Pharmacy:  Lauro Franklin   : cabinet override      12/13/17 1026 12/13/17 1139      Discharge Exam: Blood pressure (!) 152/90, pulse (!) 59, temperature 97.8 F (36.6 C), temperature source Oral, resp. rate 19, height 5'  9" (1.753 m), weight 70.3 kg, SpO2 100 %. Neurologic: Grossly normal Ambulating and voiding well  Discharge Medications:   Allergies as of 12/14/2017   No Known Allergies     Medication List    TAKE these medications   allopurinol 100 MG tablet Commonly known as:  ZYLOPRIM Take 100 mg by mouth daily.   ALPRAZolam 0.25 MG tablet Commonly known as:  XANAX Take 0.25 mg by mouth 2 (two) times daily.   aspirin 81 MG tablet Take 81 mg by mouth daily.   atenolol-chlorthalidone 50-25 MG tablet Commonly  known as:  TENORETIC Take 1 tablet by mouth at bedtime.   buPROPion 75 MG tablet Commonly known as:  WELLBUTRIN Take 75 mg by mouth daily.   cyclobenzaprine 10 MG tablet Commonly known as:  FLEXERIL Take 1 tablet (10 mg total) by mouth 3 (three) times daily as needed for muscle spasms.   divalproex 500 MG DR tablet Commonly known as:  DEPAKOTE TAKE 1 TABLET BY MOUTH TWICE DAILY   HYDROcodone-acetaminophen 5-325 MG tablet Commonly known as:  NORCO/VICODIN Take 1-2 tablets by mouth every 4 (four) hours as needed for severe pain.   KLOR-CON M20 20 MEQ tablet Generic drug:  potassium chloride SA Take 20 mEq by mouth 2 (two) times daily.   megestrol 40 MG tablet Commonly known as:  MEGACE Take 40 mg by mouth daily.   memantine 10 MG tablet Commonly known as:  NAMENDA Take 1 tablet (10 mg total) by mouth 2 (two) times daily.   simvastatin 20 MG tablet Commonly known as:  ZOCOR Take 20 mg by mouth every evening.   venlafaxine XR 75 MG 24 hr capsule Commonly known as:  EFFEXOR-XR TAKE 1 CAPSULE BY MOUTH ONCE DAILY WITH BREAKFAST What changed:  See the new instructions.       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  Discharge Instructions     Remove dressing in 72 hours   Complete by:  As directed    Call MD for:  difficulty breathing, headache or visual disturbances   Complete by:  As directed    Call MD for:  hives   Complete by:  As directed    Call MD for:  persistant nausea and vomiting   Complete by:  As directed    Call MD for:  redness, tenderness, or signs of infection (pain, swelling, redness, odor or green/yellow discharge around incision site)   Complete by:  As directed    Call MD for:  severe uncontrolled pain   Complete by:  As directed    Call MD for:  temperature >100.4   Complete by:  As directed    Diet - low sodium heart healthy   Complete by:  As directed    Driving  Restrictions   Complete by:  As directed    No driving for 2 weeks, no riding in the car for 1 week   Increase activity slowly   Complete by:  As directed    Lifting restrictions   Complete by:  As directed    No lifting more than 8 lbs         Signed: Ocie Cornfield Sharlet Notaro 12/14/2017, 7:57 AM

## 2017-12-14 NOTE — Evaluation (Signed)
Occupational Therapy Evaluation Patient Details Name: Lucas Vega MRN: [id number] DOB: November 29, 1950 Today's Date: 12/14/2017    History of Present Illness Pt is a 67 y/o male who presents s/p C3-C4 ACDF on 12/13/17. PMH significant for Alzheimer's Disease with a reported functional decline in the past 9 months per wife, prostate CA s/p prostatectomy, HTN, gout, CKD, seizures, COPD, PE, L foot drop (has AFO).   Clinical Impression   PTA, pt was living with his wife and was performing BADLs with assistance for LB ADLs and using a RW for functional mobility (recently using RW ~2 months ago). Pt requiring Min-Mod A for UB ADLs and Mod-Max A for LB ADLs. Pt would benefit from further acute OT to facilitate safe dc. Recommend dc to home with [place] for further OT to optimize safety, independence with ADLs, and return to PLOF; pt would benefit from increased therapy in home environment and would be a good candidate for [facility] program if elegable.       Follow Up Recommendations  Home health OT;Supervision/Assistance - 24 hour([facility] if qualifies)    Equipment Recommendations  None recommended by OT    Recommendations for Other Services PT consult     Precautions / Restrictions Precautions Precautions: Fall;Cervical Precaution Booklet Issued: Yes (comment) Precaution Comments: Educated pt and family on cervical precautions and adherance during ADLs Required Braces or Orthoses: Cervical Brace Cervical Brace: Soft collar Restrictions Weight Bearing Restrictions: No      Mobility Bed Mobility Overal bed mobility: Needs Assistance Bed Mobility: Rolling;Sidelying to Sit Rolling: Mod assist Sidelying to sit: Mod assist       General bed mobility comments: Mod A to roll to left and then sequence log roll technique. Pt with poor coorindation and motor planning.   Transfers Overall transfer level: Needs assistance Equipment used: Rolling walker (2 wheeled) Transfers:  Sit to/from Stand Sit to Stand: Min assist         General transfer comment: Min A for initial power up into standing. VCs for hand placement    Balance Overall balance assessment: Needs assistance Sitting-balance support: No upper extremity supported;Feet supported Sitting balance-Leahy Scale: Fair     Standing balance support: No upper extremity supported;During functional activity Standing balance-Leahy Scale: Poor                             ADL either performed or assessed with clinical judgement   ADL Overall ADL's : Needs assistance/impaired Eating/Feeding: Set up;Sitting   Grooming: Set up;Supervision/safety;Sitting   Upper Body Bathing: Minimal assistance;Sitting   Lower Body Bathing: Moderate assistance;Sit to/from stand   Upper Body Dressing : Moderate assistance;Sitting;With caregiver independent assisting Upper Body Dressing Details (indicate cue type and reason): Wife assisting pt to don shirt. Educating pt and wife on UB dressing techniques and collar management.  Lower Body Dressing: Moderate assistance;With caregiver independent assisting;Sit to/from stand Lower Body Dressing Details (indicate cue type and reason): Pt's wife assiting to don pants. Pt requiring Min A to power up into standing. Max A to don AFO Toilet Transfer: Minimal assistance;Ambulation;RW;Cueing for sequencing;Cueing for safety(Simulated to recliner) Toilet Transfer Details (indicate cue type and reason): Min A for RW management and sit<>stand         Functional mobility during ADLs: Minimal assistance;Rolling walker General ADL Comments: Pt with decreased coorindation, strength, balance, and memory. Pt's wife assisting as needed and very supportive.     Vision Baseline Vision/History: Wears  glasses Wears Glasses: At all times Patient Visual Report: No change from baseline       Perception     Praxis      Pertinent Vitals/Pain Pain Assessment: Faces Faces Pain  Scale: Hurts a little bit Pain Location: Incision site Pain Descriptors / Indicators: Operative site guarding;Discomfort Pain Intervention(s): Limited activity within patient's tolerance;Monitored during session;Repositioned     Hand Dominance     Extremity/Trunk Assessment Upper Extremity Assessment Upper Extremity Assessment: Generalized weakness   Lower Extremity Assessment Lower Extremity Assessment: Defer to PT evaluation   Cervical / Trunk Assessment Cervical / Trunk Assessment: Other exceptions Cervical / Trunk Exceptions: s/p C3-C4 ACDF   Communication Communication Communication: No difficulties   Cognition Arousal/Alertness: Awake/alert Behavior During Therapy: WFL for tasks assessed/performed Overall Cognitive Status: History of cognitive impairments - at baseline Area of Impairment: Attention;Memory;Following commands;Safety/judgement;Awareness;Problem solving                   Current Attention Level: Selective Memory: Decreased short-term memory;Decreased recall of precautions Following Commands: Follows one step commands consistently;Follows one step commands with increased time Safety/Judgement: Decreased awareness of safety;Decreased awareness of deficits Awareness: Intellectual Problem Solving: Slow processing;Decreased initiation;Difficulty sequencing;Requires verbal cues;Requires tactile cues General Comments: Pt with noted difficulty in motor planning, slow processing, and increased time/assist required for problem solving.    General Comments  Wife present throughout    Exercises     Shoulder Instructions      [place] expects to be discharged to:: Private residence Living Arrangements: Spouse/significant other Available Help at Discharge: Family;Available 24 hours/day Type of Home: House Home Access: Stairs to enter [employer] of Steps: 5 Entrance Stairs-Rails: Right;Left Home Layout: One level      Bathroom Shower/Tub: Occupational psychologist: Standard     Home Equipment: Shower seat;Grab bars - tub/shower;Grab bars - toilet;Hand held Tourist information centre manager - 2 wheels;Cane - single point          Prior Functioning/Environment Level of Independence: Needs assistance  Gait / Transfers Assistance Needed: Using RW for last 2 months; used cane prior. Wife reports recent decline in functional mobility over past 9 months. ADL's / Homemaking Assistance Needed: Wife assists with LB ADLs as needed            OT Problem List: Decreased strength;Decreased range of motion;Decreased activity tolerance;Impaired balance (sitting and/or standing);Decreased safety awareness;Decreased knowledge of use of DME or AE;Decreased knowledge of precautions;Decreased cognition;Decreased coordination;Pain      OT Treatment/Interventions: Self-care/ADL training;Therapeutic exercise;Energy conservation;DME and/or AE instruction;Therapeutic activities;Patient/family education    OT Goals(Current goals can be found in the care plan section) Acute Rehab OT Goals Patient Stated Goal: "Go home" OT Goal Formulation: With patient/family Time For Goal Achievement: 12/28/17 Potential to Achieve Goals: Good ADL Goals Pt Will Perform Grooming: with set-up;with supervision;standing Pt Will Perform Upper Body Dressing: with set-up;with supervision;sitting Pt Will Perform Lower Body Dressing: with set-up;with supervision;with caregiver independent in assisting;sit to/from stand Pt Will Transfer to Toilet: with set-up;with supervision;bedside commode;ambulating Pt Will Perform Tub/Shower Transfer: Shower transfer;with min guard assist;rolling walker;ambulating;shower seat  OT Frequency: Min 2X/week   Barriers to D/C:            Co-evaluation              AM-PAC PT "6 Clicks" Daily Activity     Outcome Measure Help from another person eating meals?: None Help from another person taking care of  personal grooming?: A Little Help  from another person toileting, which includes using toliet, bedpan, or urinal?: A Little Help from another person bathing (including washing, rinsing, drying)?: A Lot Help from another person to put on and taking off regular upper body clothing?: A Lot Help from another person to put on and taking off regular lower body clothing?: A Lot 6 Click Score: 16   End of Session Equipment Utilized During Treatment: Rolling walker;Cervical collar Nurse Communication: Mobility status;Precautions  Activity Tolerance: Patient tolerated treatment well Patient left: in chair;with call bell/phone within reach;with family/visitor present  OT Visit Diagnosis: Unsteadiness on feet (R26.81);Other abnormalities of gait and mobility (R26.89);Muscle weakness (generalized) (M62.81);Other symptoms and signs involving cognitive function;Pain Pain - part of body: (Neck)                Time: 3953-2023 OT Time Calculation (min): 32 min Charges:  OT General Charges $OT Visit: 1 Visit OT Evaluation $OT Eval Moderate Complexity: 1 Mod OT Treatments $Self Care/Home Management : 8-22 mins  Brendaly Townsel MSOT, OTR/L Acute Rehab Pager: [phone] Office: [place] 12/14/2017, 11:24 AM

## 2017-12-14 NOTE — Evaluation (Addendum)
Physical Therapy Evaluation Patient Details Name: Lucas Vega MRN: [id number] DOB: 1951-01-13 Today's Date: 12/14/2017   History of Present Illness  Pt is a 67 y/o male who presents s/p C3-C4 ACDF on 12/13/17. PMH significant for Alzheimer's Disease with a reported functional decline in the past 9 months per wife, prostate CA s/p prostatectomy, HTN, gout, CKD, seizures, COPD, PE, L foot drop (has AFO).  Clinical Impression  Pt admitted with above diagnosis. Pt currently with functional limitations due to the deficits listed below (see PT Problem List). At the time of PT eval pt was able to demonstrate transfers and ambulation with up to mod assist and RW for support. Almost constant cues required for safe ambulation and maintenance of cervical precautions. Wife present for education and instruction in proper guarding technique on the stairs. Wife was issued a gait belt to improve safety and decrease risk for falls at home. Feel this patient would benefit from increased home health services at d/c, and if pt qualifies, recommend the [place] program. Acutely, pt will benefit from skilled PT to increase their independence and safety with mobility to allow discharge to the venue listed below.       Follow Up Recommendations Home health PT;Supervision/Assistance - 24 hour([facility] if pt qualifies)    Equipment Recommendations  Rolling walker with 5" wheels    Recommendations for Other Services       Precautions / Restrictions Precautions Precautions: Fall;Cervical Precaution Booklet Issued: Yes (comment) Precaution Comments: Educated pt and family on cervical precautions and adherance during ADLs Required Braces or Orthoses: Cervical Brace Cervical Brace: Soft collar Restrictions Weight Bearing Restrictions: No      Mobility  Bed Mobility Overal bed mobility: Needs Assistance Bed Mobility: Rolling;Sit to Sidelying Rolling: Mod assist Sidelying to sit: Mod  assist     Sit to sidelying: Mod assist General bed mobility comments: Mod assist to elevate LE's up into bed and assist with rolling. VC's for log roll technique however pt had difficulty with sequencing and motor planning. Essentially got "stuck" between upright sitting and leaning onto R elbow to initiate logroll.   Transfers Overall transfer level: Needs assistance Equipment used: Rolling walker (2 wheeled) Transfers: Sit to/from Stand Sit to Stand: Mod assist         General transfer comment: Assist required for power-up to full stand as well as for controlled descent back to chair. Pt continues to have difficulty with motor planning and again essentially got "stuck" in a 1/2 squat position both when standing and then again with initiating sitting.   Ambulation/Gait Ambulation/Gait assistance: Mod assist Gait Distance (Feet): 150 Feet Assistive device: Rolling walker (2 wheeled) Gait Pattern/deviations: Step-through pattern;Decreased stride length;Decreased weight shift to left;Decreased dorsiflexion - left;Decreased step length - left;Shuffle;Steppage Gait velocity: Decreased Gait velocity interpretation: <1.8 ft/sec, indicate of risk for recurrent falls General Gait Details: VC's for improved posture, closer walker proximity, and larger step/stride length. Pt could maintain for short bouts and then reverted back to pushing walker out too far in front of him, shuffling, and difficulty initiating a step on the LLE. Several standing rest breaks to reset gait sequence was helpful.   Stairs Stairs: Yes Stairs assistance: Min assist;Mod assist;+2 safety/equipment Stair Management: One rail Left;Step to pattern;Forwards;Backwards;Sideways Number of Stairs: 5 General stair comments: Wife present for education. She was instructed in proper guarding techniques for the stairs and pt was instructed in forwards ascending, backwards descending (not safe to turn on the step), and  sideways  negotiation.  Wheelchair Mobility    Modified Rankin (Stroke Patients Only)       Balance Overall balance assessment: Needs assistance Sitting-balance support: No upper extremity supported;Feet supported Sitting balance-Leahy Scale: Fair     Standing balance support: Bilateral upper extremity supported;During functional activity Standing balance-Leahy Scale: Poor Standing balance comment: Reliant on UE support                             Pertinent Vitals/Pain Pain Assessment: Faces Faces Pain Scale: Hurts a little bit Pain Location: Incision site Pain Descriptors / Indicators: Operative site guarding;Discomfort Pain Intervention(s): Limited activity within patient's tolerance;Monitored during session;Repositioned    Home Living Family/patient expects to be discharged to:: Private residence Living Arrangements: Spouse/significant other Available Help at Discharge: Family;Available 24 hours/day Type of Home: House Home Access: Stairs to enter Entrance Stairs-Rails: Psychiatric nurse of Steps: 5 Home Layout: One level Home Equipment: Shower seat;Grab bars - tub/shower;Grab bars - toilet;Hand held shower head;Walker - 2 wheels;Cane - single point      Prior Function Level of Independence: Needs assistance   Gait / Transfers Assistance Needed: Using RW for last 2 months; used cane prior. Wife reports recent decline in functional mobility over past 9 months.  ADL's / Homemaking Assistance Needed: Wife assists with LB ADLs as needed        Hand Dominance        Extremity/Trunk Assessment   Upper Extremity Assessment Upper Extremity Assessment: Generalized weakness    Lower Extremity Assessment Lower Extremity Assessment: Defer to PT evaluation    Cervical / Trunk Assessment Cervical / Trunk Assessment: Other exceptions Cervical / Trunk Exceptions: s/p C3-C4 ACDF  Communication   Communication: No difficulties  Cognition  Arousal/Alertness: Awake/alert Behavior During Therapy: WFL for tasks assessed/performed Overall Cognitive Status: History of cognitive impairments - at baseline Area of Impairment: Attention;Memory;Following commands;Safety/judgement;Awareness;Problem solving                   Current Attention Level: Selective Memory: Decreased short-term memory;Decreased recall of precautions Following Commands: Follows one step commands consistently;Follows one step commands with increased time Safety/Judgement: Decreased awareness of safety;Decreased awareness of deficits Awareness: Intellectual Problem Solving: Slow processing;Decreased initiation;Difficulty sequencing;Requires verbal cues;Requires tactile cues General Comments: Pt with noted difficulty in motor planning, slow processing, and increased time/assist required for problem solving.       General Comments General comments (skin integrity, edema, etc.): Wife present throughout    Exercises     Assessment/Plan    PT Assessment Patient needs continued PT services  PT Problem List Decreased strength;Decreased activity tolerance;Decreased range of motion;Decreased balance;Decreased mobility;Decreased cognition;Decreased knowledge of use of DME;Decreased safety awareness;Decreased coordination;Decreased knowledge of precautions;Pain       PT Treatment Interventions DME instruction;Gait training;Stair training;Functional mobility training;Therapeutic activities;Therapeutic exercise;Neuromuscular re-education;Patient/family education    PT Goals (Current goals can be found in the Care Plan section)  Acute Rehab PT Goals Patient Stated Goal: "not fall" PT Goal Formulation: With patient/family Time For Goal Achievement: 12/28/17 Potential to Achieve Goals: Good    Frequency Min 5X/week   Barriers to discharge        Co-evaluation               AM-PAC PT "6 Clicks" Daily Activity  Outcome Measure Difficulty turning  over in bed (including adjusting bedclothes, sheets and blankets)?: Unable Difficulty moving from lying on back to sitting on the side of  the bed? : Unable Difficulty sitting down on and standing up from a chair with arms (e.g., wheelchair, bedside commode, etc,.)?: Unable Help needed moving to and from a bed to chair (including a wheelchair)?: A Little Help needed walking in hospital room?: A Little Help needed climbing 3-5 steps with a railing? : A Little 6 Click Score: 12    End of Session Equipment Utilized During Treatment: Gait belt;Other (comment)(AFO LLE) Activity Tolerance: Patient tolerated treatment well Patient left: in bed;with call bell/phone within reach;with family/visitor present Nurse Communication: Mobility status PT Visit Diagnosis: Unsteadiness on feet (R26.81);Pain;Other symptoms and signs involving the nervous system (R29.898) Pain - part of body: (neck)    Time: 2641-5830 PT Time Calculation (min) (ACUTE ONLY): 39 min   Charges:   PT Evaluation $PT Eval Moderate Complexity: 1 Mod PT Treatments $Gait Training: 23-37 mins        Rolinda Roan, PT, DPT Acute Rehabilitation Services Pager: [phone] Office: [phone]   Thelma Comp 12/14/2017, 12:35 PM

## 2017-12-14 NOTE — Progress Notes (Signed)
Pt and wife given D/C instructions with verbal understanding from the wife. Pt's incision is clean and dry with no sign of infection. Pt's IV was removed prior to D/C. Pt D/C'd home via wheelchair @ 1225 per MD order. Pt's Home Health was arranged by CM prior to D/C. Pt received RW from [place]. Pt is stable @ D/C and has no other needs at this time. Holli Humbles, RN

## 2017-12-15 DIAGNOSIS — F329 Major depressive disorder, single episode, unspecified: Secondary | ICD-10-CM | POA: Diagnosis not present

## 2017-12-15 DIAGNOSIS — G300 Alzheimer's disease with early onset: Secondary | ICD-10-CM | POA: Diagnosis not present

## 2017-12-15 DIAGNOSIS — N189 Chronic kidney disease, unspecified: Secondary | ICD-10-CM | POA: Diagnosis not present

## 2017-12-15 DIAGNOSIS — M4712 Other spondylosis with myelopathy, cervical region: Secondary | ICD-10-CM | POA: Diagnosis not present

## 2017-12-15 DIAGNOSIS — J449 Chronic obstructive pulmonary disease, unspecified: Secondary | ICD-10-CM | POA: Diagnosis not present

## 2017-12-15 DIAGNOSIS — I129 Hypertensive chronic kidney disease with stage 1 through stage 4 chronic kidney disease, or unspecified chronic kidney disease: Secondary | ICD-10-CM | POA: Diagnosis not present

## 2017-12-15 DIAGNOSIS — M21372 Foot drop, left foot: Secondary | ICD-10-CM | POA: Diagnosis not present

## 2017-12-15 DIAGNOSIS — G40909 Epilepsy, unspecified, not intractable, without status epilepticus: Secondary | ICD-10-CM | POA: Diagnosis not present

## 2017-12-15 DIAGNOSIS — M4802 Spinal stenosis, cervical region: Secondary | ICD-10-CM | POA: Diagnosis not present

## 2017-12-15 DIAGNOSIS — G9519 Other vascular myelopathies: Secondary | ICD-10-CM | POA: Diagnosis not present

## 2017-12-15 DIAGNOSIS — Z4789 Encounter for other orthopedic aftercare: Secondary | ICD-10-CM | POA: Diagnosis not present

## 2017-12-15 DIAGNOSIS — F0280 Dementia in other diseases classified elsewhere without behavioral disturbance: Secondary | ICD-10-CM | POA: Diagnosis not present

## 2017-12-16 DIAGNOSIS — M4712 Other spondylosis with myelopathy, cervical region: Secondary | ICD-10-CM | POA: Diagnosis not present

## 2017-12-16 DIAGNOSIS — Z4789 Encounter for other orthopedic aftercare: Secondary | ICD-10-CM | POA: Diagnosis not present

## 2017-12-17 DIAGNOSIS — M4712 Other spondylosis with myelopathy, cervical region: Secondary | ICD-10-CM | POA: Diagnosis not present

## 2017-12-17 DIAGNOSIS — Z4789 Encounter for other orthopedic aftercare: Secondary | ICD-10-CM | POA: Diagnosis not present

## 2017-12-20 DIAGNOSIS — M4712 Other spondylosis with myelopathy, cervical region: Secondary | ICD-10-CM | POA: Diagnosis not present

## 2017-12-20 DIAGNOSIS — Z4789 Encounter for other orthopedic aftercare: Secondary | ICD-10-CM | POA: Diagnosis not present

## 2017-12-21 DIAGNOSIS — M4712 Other spondylosis with myelopathy, cervical region: Secondary | ICD-10-CM | POA: Diagnosis not present

## 2017-12-21 DIAGNOSIS — Z4789 Encounter for other orthopedic aftercare: Secondary | ICD-10-CM | POA: Diagnosis not present

## 2017-12-22 DIAGNOSIS — M4712 Other spondylosis with myelopathy, cervical region: Secondary | ICD-10-CM | POA: Diagnosis not present

## 2017-12-22 DIAGNOSIS — Z4789 Encounter for other orthopedic aftercare: Secondary | ICD-10-CM | POA: Diagnosis not present

## 2017-12-23 DIAGNOSIS — Z4789 Encounter for other orthopedic aftercare: Secondary | ICD-10-CM | POA: Diagnosis not present

## 2017-12-23 DIAGNOSIS — M4712 Other spondylosis with myelopathy, cervical region: Secondary | ICD-10-CM | POA: Diagnosis not present

## 2017-12-28 DIAGNOSIS — Z4789 Encounter for other orthopedic aftercare: Secondary | ICD-10-CM | POA: Diagnosis not present

## 2017-12-28 DIAGNOSIS — M4712 Other spondylosis with myelopathy, cervical region: Secondary | ICD-10-CM | POA: Diagnosis not present

## 2017-12-29 ENCOUNTER — Emergency Department (HOSPITAL_COMMUNITY)
Admission: EM | Admit: 2017-12-29 | Discharge: 2017-12-29 | Disposition: A | Discharge status: Home / Self Care | Payer: Medicare Other | Attending: Emergency Medicine | Admitting: Emergency Medicine

## 2017-12-29 ENCOUNTER — Emergency Department (HOSPITAL_COMMUNITY): Payer: Medicare Other

## 2017-12-29 DIAGNOSIS — Y9389 Activity, other specified: Secondary | ICD-10-CM | POA: Insufficient documentation

## 2017-12-29 DIAGNOSIS — Z8546 Personal history of malignant neoplasm of prostate: Secondary | ICD-10-CM | POA: Insufficient documentation

## 2017-12-29 DIAGNOSIS — S299XXA Unspecified injury of thorax, initial encounter: Secondary | ICD-10-CM | POA: Diagnosis present

## 2017-12-29 DIAGNOSIS — Z4789 Encounter for other orthopedic aftercare: Secondary | ICD-10-CM | POA: Diagnosis not present

## 2017-12-29 DIAGNOSIS — Z79899 Other long term (current) drug therapy: Secondary | ICD-10-CM | POA: Diagnosis not present

## 2017-12-29 DIAGNOSIS — Z7982 Long term (current) use of aspirin: Secondary | ICD-10-CM | POA: Insufficient documentation

## 2017-12-29 DIAGNOSIS — Y92002 Bathroom of unspecified non-institutional (private) residence single-family (private) house as the place of occurrence of the external cause: Secondary | ICD-10-CM | POA: Diagnosis not present

## 2017-12-29 DIAGNOSIS — W1839XA Other fall on same level, initial encounter: Secondary | ICD-10-CM | POA: Insufficient documentation

## 2017-12-29 DIAGNOSIS — Y999 Unspecified external cause status: Secondary | ICD-10-CM | POA: Diagnosis not present

## 2017-12-29 DIAGNOSIS — J449 Chronic obstructive pulmonary disease, unspecified: Secondary | ICD-10-CM | POA: Insufficient documentation

## 2017-12-29 DIAGNOSIS — F0280 Dementia in other diseases classified elsewhere without behavioral disturbance: Secondary | ICD-10-CM | POA: Insufficient documentation

## 2017-12-29 DIAGNOSIS — M4712 Other spondylosis with myelopathy, cervical region: Secondary | ICD-10-CM | POA: Diagnosis not present

## 2017-12-29 DIAGNOSIS — I10 Essential (primary) hypertension: Secondary | ICD-10-CM | POA: Insufficient documentation

## 2017-12-29 DIAGNOSIS — G300 Alzheimer's disease with early onset: Secondary | ICD-10-CM | POA: Diagnosis not present

## 2017-12-29 DIAGNOSIS — S2232XA Fracture of one rib, left side, initial encounter for closed fracture: Secondary | ICD-10-CM

## 2017-12-29 DIAGNOSIS — Z87891 Personal history of nicotine dependence: Secondary | ICD-10-CM | POA: Insufficient documentation

## 2017-12-29 DIAGNOSIS — W19XXXA Unspecified fall, initial encounter: Secondary | ICD-10-CM | POA: Diagnosis not present

## 2017-12-29 DIAGNOSIS — R0781 Pleurodynia: Secondary | ICD-10-CM | POA: Diagnosis not present

## 2017-12-29 DIAGNOSIS — R52 Pain, unspecified: Secondary | ICD-10-CM | POA: Diagnosis not present

## 2017-12-29 MED ORDER — ACETAMINOPHEN 325 MG PO TABS
650.0000 mg | ORAL_TABLET | Freq: Once | ORAL | Status: AC
  Administered 2017-12-29: 650 mg via ORAL
  Filled 2017-12-29: qty 2

## 2017-12-29 NOTE — ED Notes (Signed)
Spoke with Crystal from CM regarding pts Rx for bedside commode.  Per pts wife request, Crystal will fax over a physical copy of the [facility] prescription and pts wife will go to [place] store and pick up the [facility] tomorrow.

## 2017-12-29 NOTE — ED Triage Notes (Addendum)
[name] EMS from home for witnessed multiple falls in bathroom last night (1st fall appx 0300 this morning), this morning, and a fall sometime during the day today.  Pt has wheelchair in the home that has been used since the surgery, also had a walker.  Family reports pt had no issues moving around house with aide prior to a couple days ago.  Family reports pt had no LOC, not on blood thinners.  Pt has hx of hypertension, Alzheimers. Pt is having left sided intercostal pain.   Wife reports pt had spinal fusion surgery on 12/13/17 (C3/C4), pt currently in foam collar.

## 2017-12-29 NOTE — Discharge Instructions (Addendum)
Take ibuprofen and tylenol for pain °

## 2017-12-29 NOTE — ED Provider Notes (Signed)
[place] DEPT Provider Note   CSN: [id number] Arrival date & time: 12/29/17  1743     History   Chief Complaint Chief Complaint  Patient presents with  . Fall   Level 5 caveat: Dementia   HPI Lucas Vega is a 67 y.o. male.  HPI 67 year old male presents to the emergency department after a fall last night.  He presents with mild left-sided chest discomfort.  He has dementia and therefore cannot provide significant information regarding the fall.  Patient's family members with him and reports that he was taken to the commode in the middle the night to use the restroom and he got up on his own without assistance and fell.  He does not have a head injury.  No vomiting.  Otherwise acting normal today except for complaining of mild left-sided chest discomfort.   Past Medical History:  Diagnosis Date  . Alzheimer's disease with early onset (CODE) ([place]) 2015  . Chronic kidney disease   . Convulsions/seizures ([place]) 07/05/2014   last one 1 1/2 years ago as of 12/08/17  . COPD (chronic obstructive pulmonary disease) ([place])   . Depression   . Gait abnormality 10/28/2017  . Gout   . History of kidney stones   . History of pulmonary embolism    after surgery ? 2004  . Hypertension   . Left foot drop 10/28/2017  . Memory disturbance 01/26/2013  . Pneumonia    as a child  . Pre-diabetes    took Metformin for a while, but after weight loss did not have take it anymore  . Prostate cancer ([place])   . RLS (restless legs syndrome)   . Sleep apnea    "mild case" - tried cpap but he couldn't use it.    Patient Active Problem List   Diagnosis Date Noted  . Myelopathy ([place]) 12/13/2017  . Gait abnormality 10/28/2017  . Left foot drop 10/28/2017  . Convulsions/seizures ([place]) 07/05/2014  . Right shoulder pain 07/05/2014  . Memory disturbance 01/26/2013    Past Surgical History:  Procedure Laterality Date  . ANTERIOR CERVICAL DECOMP/DISCECTOMY  FUSION N/A 12/13/2017   Procedure: ANTERIOR CERVICAL DECOMPRESSION/DISCECTOMY FUSION CERVICAL THREE-CERVICAL FOUR;  Surgeon: Kary Kos, MD;  Location: [facility];  Service: Neurosurgery;  Laterality: N/A;  ANTERIOR CERVICAL DECOMPRESSION/DISCECTOMY FUSION CERVICAL THREE-CERVICAL FOUR  . COLONOSCOPY  2009   JAN  . PROSTATECTOMY    . TONSILLECTOMY AND ADENOIDECTOMY          Home Medications    Prior to Admission medications   Medication Sig Start Date End Date Taking? Authorizing Provider  allopurinol (ZYLOPRIM) 100 MG tablet Take 100 mg by mouth daily.    [provider]  ALPRAZolam Duanne Moron) 0.25 MG tablet Take 0.25 mg by mouth 2 (two) times daily.     [provider]  aspirin 81 MG tablet Take 81 mg by mouth daily.    [provider]  atenolol-chlorthalidone (TENORETIC) 50-25 MG per tablet Take 1 tablet by mouth at bedtime.     [provider]  buPROPion (WELLBUTRIN) 75 MG tablet Take 75 mg by mouth daily.    [provider]  cyclobenzaprine (FLEXERIL) 10 MG tablet Take 1 tablet (10 mg total) by mouth 3 (three) times daily as needed for muscle spasms. 12/14/17   Meyran, Ocie Cornfield, NP  divalproex (DEPAKOTE) 500 MG DR tablet TAKE 1 TABLET BY MOUTH TWICE DAILY Patient taking differently: Take 500 mg by mouth 2 (two) times daily.  09/01/17   Kathrynn Ducking, MD  HYDROcodone-acetaminophen (NORCO/VICODIN) 5-325 MG tablet Take 1-2 tablets by mouth every 4 (four) hours as needed for severe pain. 12/14/17   Meyran, Ocie Cornfield, NP  KLOR-CON M20 20 MEQ tablet Take 20 mEq by mouth 2 (two) times daily.  02/18/16   [provider]  megestrol (MEGACE) 40 MG tablet Take 40 mg by mouth daily.     [provider]  memantine (NAMENDA) 10 MG tablet Take 1 tablet (10 mg total) by mouth 2 (two) times daily. 08/29/14   Ward Givens, NP  simvastatin (ZOCOR) 20 MG tablet Take 20 mg by mouth every evening.     [provider]    venlafaxine XR (EFFEXOR-XR) 75 MG 24 hr capsule TAKE 1 CAPSULE BY MOUTH ONCE DAILY WITH BREAKFAST Patient taking differently: Take 75 mg by mouth daily with breakfast.  10/28/17   Kathrynn Ducking, MD    Family History Family History  Problem Relation Age of Onset  . Dementia Mother   . Pancreatic cancer Father   . Seizures Neg Hx     Social History Social History   Tobacco Use  . Smoking status: Former Smoker    Last attempt to quit: 09/29/2012    Years since quitting: 5.2  . Smokeless tobacco: Never Used  Substance Use Topics  . Alcohol use: Yes    Comment: occasional  . Drug use: No     Allergies   Patient has no known allergies.   Review of Systems Review of Systems  Unable to perform ROS: Dementia     Physical Exam Updated Vital Signs BP 124/89   Pulse (!) 58   Temp 98.6 F (37 C) (Oral)   Resp 13   SpO2 100%   Physical Exam  Constitutional: He is oriented to person, place, and time. He appears well-developed and well-nourished.  HENT:  Head: Normocephalic and atraumatic.  Eyes: EOM are normal.  Neck: Normal range of motion.  Cardiovascular: Normal rate, regular rhythm and normal heart sounds.  Pulmonary/Chest: Effort normal and breath sounds normal. No respiratory distress.  Mild tenderness left lateral chest wall without bruising or crepitus.  Abdominal: Soft. He exhibits no distension. There is no tenderness.  Musculoskeletal: Normal range of motion.  Neurological: He is alert and oriented to person, place, and time.  Skin: Skin is warm and dry.  Psychiatric: He has a normal mood and affect. Judgment normal.  Nursing note and vitals reviewed.    ED Treatments / Results  Labs (all labs ordered are listed, but only abnormal results are displayed) Labs Reviewed - No data to display  EKG None  Radiology Dg Ribs Unilateral W/chest Left  Result Date: 12/29/2017 CLINICAL DATA:  67 year old male with multiple recent falls. Left rib pain.  EXAM: LEFT RIBS AND CHEST - 3+ VIEW COMPARISON:  Chest radiographs 12/10/2006. FINDINGS: Upright AP view of the chest and oblique views of the left ribs. Lung volumes and mediastinal contours are within normal limits. Visualized tracheal air column is within normal limits. Both lungs appear clear. No pneumothorax or pleural effusion identified. Negative visible bowel gas pattern. Oblique views of the left ribs. There is an acute to subacute left anterolateral 9th rib fracture, perhaps with some periosteal new bone formation. This is superimposed on a healed chronic anterior left 9th rib fracture. Hypoplastic 12th ribs suspected. There might be a nondisplaced left anterolateral 8th rib fracture. The other ribs appear intact. No other No acute osseous abnormality identified.  IMPRESSION: 1. Acute or subacute on chronic fracture of the anterolateral 9th left rib. Possible nondisplaced fracture of the adjacent left 8th rib. 2.  No acute cardiopulmonary abnormality. Electronically Signed   By: Genevie Ann M.D.   On: 12/29/2017 19:04    Procedures .Ortho Injury Treatment Performed by: Jola Schmidt, MD Authorized by: Jola Schmidt, MD    Definitive Fracture Care  Definitive fracture care was performed for the patient's left 9th rib fracture. Treatment includes management of pain Fracture related discharge instructions were provided Symptomatic control measures provided to the patient   Medications Ordered in ED Medications  acetaminophen (TYLENOL) tablet 650 mg (has no administration in time range)     Initial Impression / Assessment and Plan / ED Course  I have reviewed the triage vital signs and the nursing notes.  Pertinent labs & imaging results that were available during my care of the patient were reviewed by me and considered in my medical decision making (see chart for details).        Durable Medical Equipment  (From admission, onward)         Start     Ordered   12/29/17 0000  DME  Bedside commode    Question:  Patient needs a bedside commode to treat with the following condition  Answer:  Falls frequently   12/29/17 1831          Home with ibuprofen and Tylenol.  Patient will be given a urinal to take home as well as ordered a bedside commode so is that hopefully this will minimize falls in the future as these can help him in the middle the night urinate without moving far from the bed.  Final Clinical Impressions(s) / ED Diagnoses   Final diagnoses:  Closed fracture of one rib of left side, initial encounter    ED Discharge Orders         Ordered    DME Bedside commode     12/29/17 1831           Jola Schmidt, MD 12/29/17 1947

## 2017-12-29 NOTE — ED Notes (Signed)
Patient transported to X-ray 

## 2017-12-29 NOTE — Progress Notes (Signed)
[facility] ED CM received a call from [facility] ED. CM spoke with the patient's nurse and asked if the patient's family would like to take the order/prescription for the bedside commode or have it delivered. Per the nurse the patient's wife would like to pick up the [facility] from the [facility] store on [address]. The patient will be provided with the order/prescription. ED CM also faxed the order to [facility] and noted the family's plan on the cover sheet. Venita Sheffield RN CCM

## 2017-12-30 ENCOUNTER — Telehealth: Payer: Self-pay | Admitting: *Deleted

## 2017-12-30 DIAGNOSIS — M4712 Other spondylosis with myelopathy, cervical region: Secondary | ICD-10-CM | POA: Diagnosis not present

## 2017-12-30 DIAGNOSIS — Z4789 Encounter for other orthopedic aftercare: Secondary | ICD-10-CM | POA: Diagnosis not present

## 2017-12-30 NOTE — Telephone Encounter (Signed)
EDCM called the patient and spoke with his wife. She states she has the prescription for the [facility] but did not have time to pick it up from [facility] today. She plans to call tomorrow. She states the patient is feeling better today. Venita Sheffield RN CCM

## 2017-12-31 DIAGNOSIS — M4712 Other spondylosis with myelopathy, cervical region: Secondary | ICD-10-CM | POA: Diagnosis not present

## 2017-12-31 DIAGNOSIS — Z4789 Encounter for other orthopedic aftercare: Secondary | ICD-10-CM | POA: Diagnosis not present

## 2018-01-03 DIAGNOSIS — Z4789 Encounter for other orthopedic aftercare: Secondary | ICD-10-CM | POA: Diagnosis not present

## 2018-01-03 DIAGNOSIS — M4712 Other spondylosis with myelopathy, cervical region: Secondary | ICD-10-CM | POA: Diagnosis not present

## 2018-01-05 DIAGNOSIS — M4712 Other spondylosis with myelopathy, cervical region: Secondary | ICD-10-CM | POA: Diagnosis not present

## 2018-01-05 DIAGNOSIS — Z4789 Encounter for other orthopedic aftercare: Secondary | ICD-10-CM | POA: Diagnosis not present

## 2018-01-06 DIAGNOSIS — M4712 Other spondylosis with myelopathy, cervical region: Secondary | ICD-10-CM | POA: Diagnosis not present

## 2018-01-06 DIAGNOSIS — Z4789 Encounter for other orthopedic aftercare: Secondary | ICD-10-CM | POA: Diagnosis not present

## 2018-01-17 DIAGNOSIS — Z23 Encounter for immunization: Secondary | ICD-10-CM | POA: Diagnosis not present

## 2018-01-17 DIAGNOSIS — M4712 Other spondylosis with myelopathy, cervical region: Secondary | ICD-10-CM | POA: Diagnosis not present

## 2018-01-17 DIAGNOSIS — Z4789 Encounter for other orthopedic aftercare: Secondary | ICD-10-CM | POA: Diagnosis not present

## 2018-01-17 DIAGNOSIS — Z111 Encounter for screening for respiratory tuberculosis: Secondary | ICD-10-CM | POA: Diagnosis not present

## 2018-01-20 DIAGNOSIS — M542 Cervicalgia: Secondary | ICD-10-CM | POA: Diagnosis not present

## 2018-01-22 ENCOUNTER — Telehealth: Payer: Self-pay | Admitting: Neurology

## 2018-01-22 MED ORDER — HYDROXYZINE HCL 25 MG PO TABS: 25.0000 mg | ORAL_TABLET | Freq: Three times a day (TID) | ORAL | 1 refills | Status: DC | PRN

## 2018-01-22 NOTE — Telephone Encounter (Signed)
He is having more agitation, worse since moving to [facility] yesterday. He has not yet seen the 'house doctor'  I sent in a prescription for hydroxyzine 25 mg tid prn agitation

## 2018-01-25 ENCOUNTER — Emergency Department (HOSPITAL_COMMUNITY): Payer: Medicare Other

## 2018-01-25 ENCOUNTER — Other Ambulatory Visit: Payer: Self-pay

## 2018-01-25 ENCOUNTER — Emergency Department (HOSPITAL_COMMUNITY)
Admission: EM | Admit: 2018-01-25 | Discharge: 2018-01-25 | Disposition: A | Discharge status: Home / Self Care | Payer: Medicare Other | Attending: Emergency Medicine | Admitting: Emergency Medicine

## 2018-01-25 ENCOUNTER — Encounter (HOSPITAL_COMMUNITY): Payer: Self-pay

## 2018-01-25 DIAGNOSIS — Z87891 Personal history of nicotine dependence: Secondary | ICD-10-CM | POA: Insufficient documentation

## 2018-01-25 DIAGNOSIS — Y998 Other external cause status: Secondary | ICD-10-CM | POA: Diagnosis not present

## 2018-01-25 DIAGNOSIS — Y92129 Unspecified place in nursing home as the place of occurrence of the external cause: Secondary | ICD-10-CM | POA: Insufficient documentation

## 2018-01-25 DIAGNOSIS — M25522 Pain in left elbow: Secondary | ICD-10-CM | POA: Diagnosis not present

## 2018-01-25 DIAGNOSIS — I959 Hypotension, unspecified: Secondary | ICD-10-CM | POA: Diagnosis not present

## 2018-01-25 DIAGNOSIS — I129 Hypertensive chronic kidney disease with stage 1 through stage 4 chronic kidney disease, or unspecified chronic kidney disease: Secondary | ICD-10-CM | POA: Insufficient documentation

## 2018-01-25 DIAGNOSIS — Z7982 Long term (current) use of aspirin: Secondary | ICD-10-CM | POA: Insufficient documentation

## 2018-01-25 DIAGNOSIS — Y9389 Activity, other specified: Secondary | ICD-10-CM | POA: Insufficient documentation

## 2018-01-25 DIAGNOSIS — S0990XA Unspecified injury of head, initial encounter: Secondary | ICD-10-CM | POA: Insufficient documentation

## 2018-01-25 DIAGNOSIS — Z79899 Other long term (current) drug therapy: Secondary | ICD-10-CM | POA: Insufficient documentation

## 2018-01-25 DIAGNOSIS — S199XXA Unspecified injury of neck, initial encounter: Secondary | ICD-10-CM | POA: Diagnosis not present

## 2018-01-25 DIAGNOSIS — W0110XA Fall on same level from slipping, tripping and stumbling with subsequent striking against unspecified object, initial encounter: Secondary | ICD-10-CM | POA: Insufficient documentation

## 2018-01-25 DIAGNOSIS — S59902A Unspecified injury of left elbow, initial encounter: Secondary | ICD-10-CM | POA: Diagnosis not present

## 2018-01-25 DIAGNOSIS — W19XXXA Unspecified fall, initial encounter: Secondary | ICD-10-CM

## 2018-01-25 DIAGNOSIS — R609 Edema, unspecified: Secondary | ICD-10-CM | POA: Diagnosis not present

## 2018-01-25 DIAGNOSIS — F329 Major depressive disorder, single episode, unspecified: Secondary | ICD-10-CM | POA: Diagnosis not present

## 2018-01-25 DIAGNOSIS — N189 Chronic kidney disease, unspecified: Secondary | ICD-10-CM | POA: Insufficient documentation

## 2018-01-25 DIAGNOSIS — R404 Transient alteration of awareness: Secondary | ICD-10-CM | POA: Diagnosis not present

## 2018-01-25 DIAGNOSIS — Z8546 Personal history of malignant neoplasm of prostate: Secondary | ICD-10-CM | POA: Diagnosis not present

## 2018-01-25 DIAGNOSIS — J449 Chronic obstructive pulmonary disease, unspecified: Secondary | ICD-10-CM | POA: Insufficient documentation

## 2018-01-25 NOTE — ED Triage Notes (Signed)
He was seen by staff at [facility] to fall this morning. They saw that he struck his left elbow and his head as he fell. He c/o left elbow pain, but has no other c/o pain, including head/neck. He is alert and confused at his baseline.

## 2018-01-25 NOTE — ED Provider Notes (Signed)
[place] DEPT Provider Note   CSN: [id number] Arrival date & time: 01/25/18  0909     History   Chief Complaint Chief Complaint  Patient presents with  . Fall  . Joint Swelling   Level 5 caveat: Dementia  HPI Lucas Vega is a 67 y.o. male.  HPI 67 year old male presents to the emergency department from his nursing home today after a witnessed fall.  He struck his head and his left elbow.  He reports pain in his left elbow but no other significant symptoms.  Did have a mild headache without loss consciousness.  Only aspirin use as an anticoagulant.  Nothing stronger.  No fevers or chills.  No other major extremity joint pain.  History of dementia.  Information obtained from wife.  Based on mental status now.   Past Medical History:  Diagnosis Date  . Alzheimer's disease with early onset (CODE) ([place]) 2015  . Chronic kidney disease   . Convulsions/seizures ([place]) 07/05/2014   last one 1 1/2 years ago as of 12/08/17  . COPD (chronic obstructive pulmonary disease) ([place])   . Depression   . Gait abnormality 10/28/2017  . Gout   . History of kidney stones   . History of pulmonary embolism    after surgery ? 2004  . Hypertension   . Left foot drop 10/28/2017  . Memory disturbance 01/26/2013  . Pneumonia    as a child  . Pre-diabetes    took Metformin for a while, but after weight loss did not have take it anymore  . Prostate cancer ([place])   . RLS (restless legs syndrome)   . Sleep apnea    "mild case" - tried cpap but he couldn't use it.    Patient Active Problem List   Diagnosis Date Noted  . Myelopathy ([place]) 12/13/2017  . Gait abnormality 10/28/2017  . Left foot drop 10/28/2017  . Convulsions/seizures ([place]) 07/05/2014  . Right shoulder pain 07/05/2014  . Memory disturbance 01/26/2013    Past Surgical History:  Procedure Laterality Date  . ANTERIOR CERVICAL DECOMP/DISCECTOMY FUSION N/A 12/13/2017   Procedure: ANTERIOR CERVICAL  DECOMPRESSION/DISCECTOMY FUSION CERVICAL THREE-CERVICAL FOUR;  Surgeon: Kary Kos, MD;  Location: [place];  Service: Neurosurgery;  Laterality: N/A;  ANTERIOR CERVICAL DECOMPRESSION/DISCECTOMY FUSION CERVICAL THREE-CERVICAL FOUR  . COLONOSCOPY  2009   JAN  . PROSTATECTOMY    . TONSILLECTOMY AND ADENOIDECTOMY          Home Medications    Prior to Admission medications   Medication Sig Start Date End Date Taking? Authorizing Provider  allopurinol (ZYLOPRIM) 100 MG tablet Take 100 mg by mouth daily.    [provider]  ALPRAZolam Duanne Moron) 0.25 MG tablet Take 0.25 mg by mouth 2 (two) times daily.     [provider]  aspirin 81 MG tablet Take 81 mg by mouth daily.    [provider]  atenolol-chlorthalidone (TENORETIC) 50-25 MG per tablet Take 1 tablet by mouth at bedtime.     [provider]  buPROPion (WELLBUTRIN) 75 MG tablet Take 75 mg by mouth daily.    [provider]  cyclobenzaprine (FLEXERIL) 10 MG tablet Take 1 tablet (10 mg total) by mouth 3 (three) times daily as needed for muscle spasms. 12/14/17   Meyran, Ocie Cornfield, NP  divalproex (DEPAKOTE) 500 MG DR tablet TAKE 1 TABLET BY MOUTH TWICE DAILY Patient taking differently: Take 500 mg by mouth 2 (two) times daily.  09/01/17   Margette Fast  K, MD  HYDROcodone-acetaminophen (NORCO/VICODIN) 5-325 MG tablet Take 1-2 tablets by mouth every 4 (four) hours as needed for severe pain. 12/14/17   Meyran, Ocie Cornfield, NP  hydrOXYzine (ATARAX/VISTARIL) 25 MG tablet Take 1 tablet (25 mg total) by mouth 3 (three) times daily as needed. For agitation 01/22/18   Sater, Nanine Means, MD  KLOR-CON M20 20 MEQ tablet Take 20 mEq by mouth 2 (two) times daily.  02/18/16   [provider]  megestrol (MEGACE) 40 MG tablet Take 40 mg by mouth daily.     [provider]  memantine (NAMENDA) 10 MG tablet Take 1 tablet (10 mg total) by mouth 2 (two) times daily. 08/29/14   Ward Givens, NP    simvastatin (ZOCOR) 20 MG tablet Take 20 mg by mouth every evening.     [provider]  venlafaxine XR (EFFEXOR-XR) 75 MG 24 hr capsule TAKE 1 CAPSULE BY MOUTH ONCE DAILY WITH BREAKFAST Patient taking differently: Take 75 mg by mouth daily with breakfast.  10/28/17   Kathrynn Ducking, MD    Family History Family History  Problem Relation Age of Onset  . Dementia Mother   . Pancreatic cancer Father   . Seizures Neg Hx     Social History Social History   Tobacco Use  . Smoking status: Former Smoker    Last attempt to quit: 09/29/2012    Years since quitting: 5.3  . Smokeless tobacco: Never Used  Substance Use Topics  . Alcohol use: Yes    Comment: occasional  . Drug use: No     Allergies   Patient has no known allergies.   Review of Systems Review of Systems  Unable to perform ROS: Dementia     Physical Exam Updated Vital Signs BP 135/90 (BP Location: Right Arm)   Pulse 94   Temp 97.9 F (36.6 C) (Oral)   Resp 16   SpO2 100%   Physical Exam  Constitutional: He appears well-developed and well-nourished.  HENT:  Head: Normocephalic and atraumatic.  Eyes: EOM are normal.  Neck: Normal range of motion.  Cardiovascular: Normal rate, regular rhythm, normal heart sounds and intact distal pulses.  Pulmonary/Chest: Effort normal and breath sounds normal. No respiratory distress.  Abdominal: Soft. He exhibits no distension. There is no tenderness.  Musculoskeletal: Normal range of motion.  Full range of motion of bilateral shoulders and wrists.  Full range of motion of right elbow.  Mild pain with range of motion of left elbow without obvious deformity or joint swelling.  Full range of motion of bilateral hips, knees, ankles.  Mild pain with range of motion at the left first MTP joint consistent with gout with some erythema and warmth over the joint and the dorsum of the foot  Neurological: He is alert.  Skin: Skin is warm and dry.  Psychiatric: He has a  normal mood and affect. Judgment normal.  Nursing note and vitals reviewed.    ED Treatments / Results  Labs (all labs ordered are listed, but only abnormal results are displayed) Labs Reviewed - No data to display  EKG None  Radiology Dg Elbow Complete Left  Result Date: 01/25/2018 CLINICAL DATA:  Injured left elbow during a fall. EXAM: LEFT ELBOW - COMPLETE 3+ VIEW COMPARISON:  None. FINDINGS: The joint spaces are maintained. There are moderate degenerative changes. No definite acute fracture. No joint effusion. IMPRESSION: Moderate degenerative changes but no acute fracture or joint effusion. Electronically Signed   By: Marijo Sanes  M.D.   On: 01/25/2018 10:36   Ct Head Wo Contrast  Result Date: 01/25/2018 CLINICAL DATA:  Fall. EXAM: CT HEAD WITHOUT CONTRAST CT CERVICAL SPINE WITHOUT CONTRAST TECHNIQUE: Multidetector CT imaging of the head and cervical spine was performed following the standard protocol without intravenous contrast. Multiplanar CT image reconstructions of the cervical spine were also generated. COMPARISON:  06/01/2017 FINDINGS: CT HEAD FINDINGS Brain: There is atrophy and chronic small vessel disease changes. Associated ventriculomegaly, stable. No acute intracranial abnormality. Specifically, no hemorrhage, hydrocephalus, mass lesion, acute infarction, or significant intracranial injury. Vascular: No hyperdense vessel or unexpected calcification. Skull: No acute calvarial abnormality. Sinuses/Orbits: Visualized paranasal sinuses and mastoids clear. Orbital soft tissues unremarkable. Other: None CT CERVICAL SPINE FINDINGS Alignment: No subluxation Skull base and vertebrae: No fracture or focal pathologic process. Soft tissues and spinal canal: Prevertebral soft tissues are normal. No epidural or paraspinal hematoma. Disc levels: Prior anterior fusion at C3-4. Severe diffuse degenerative disc disease. Large flowing anterior osteophytes from C4-C6. Moderate degenerative facet  disease bilaterally. Upper chest: No acute findings Other: None IMPRESSION: No acute intracranial abnormality. Atrophy, chronic microvascular disease. Prior anterior fusion at C3-4. Severe diffuse degenerative disc disease and moderate degenerative facet disease. No acute bony abnormality. Electronically Signed   By: Rolm Baptise M.D.   On: 01/25/2018 12:15   Ct Cervical Spine Wo Contrast  Result Date: 01/25/2018 CLINICAL DATA:  Fall. EXAM: CT HEAD WITHOUT CONTRAST CT CERVICAL SPINE WITHOUT CONTRAST TECHNIQUE: Multidetector CT imaging of the head and cervical spine was performed following the standard protocol without intravenous contrast. Multiplanar CT image reconstructions of the cervical spine were also generated. COMPARISON:  06/01/2017 FINDINGS: CT HEAD FINDINGS Brain: There is atrophy and chronic small vessel disease changes. Associated ventriculomegaly, stable. No acute intracranial abnormality. Specifically, no hemorrhage, hydrocephalus, mass lesion, acute infarction, or significant intracranial injury. Vascular: No hyperdense vessel or unexpected calcification. Skull: No acute calvarial abnormality. Sinuses/Orbits: Visualized paranasal sinuses and mastoids clear. Orbital soft tissues unremarkable. Other: None CT CERVICAL SPINE FINDINGS Alignment: No subluxation Skull base and vertebrae: No fracture or focal pathologic process. Soft tissues and spinal canal: Prevertebral soft tissues are normal. No epidural or paraspinal hematoma. Disc levels: Prior anterior fusion at C3-4. Severe diffuse degenerative disc disease. Large flowing anterior osteophytes from C4-C6. Moderate degenerative facet disease bilaterally. Upper chest: No acute findings Other: None IMPRESSION: No acute intracranial abnormality. Atrophy, chronic microvascular disease. Prior anterior fusion at C3-4. Severe diffuse degenerative disc disease and moderate degenerative facet disease. No acute bony abnormality. Electronically Signed   By:  Rolm Baptise M.D.   On: 01/25/2018 12:15    Procedures Procedures (including critical care time)  Medications Ordered in ED Medications - No data to display   Initial Impression / Assessment and Plan / ED Course  I have reviewed the triage vital signs and the nursing notes.  Pertinent labs & imaging results that were available during my care of the patient were reviewed by me and considered in my medical decision making (see chart for details).     Work-up in the emergency department without significant abnormality.  No acute traumatic injury.  Suspect gout at the left first MTP joint.  Doubt cellulitis.  Overall well-appearing.  Stable for discharge.  Final Clinical Impressions(s) / ED Diagnoses   Final diagnoses:  Fall, initial encounter  Closed head injury, initial encounter  Left elbow pain    ED Discharge Orders    None       Jola Schmidt, MD  01/25/18 1253  

## 2018-01-25 NOTE — ED Notes (Signed)
Bed: WA08 Expected date:  Expected time:  Means of arrival:  Comments: EMS-fall

## 2018-01-25 NOTE — ED Notes (Signed)
Per his request we get him a bedpan to have b.m. He was, however unable to have b.m. And tells [place] that now he does not need to have one. His wife remains at his bedside.

## 2018-01-27 ENCOUNTER — Other Ambulatory Visit: Payer: Self-pay | Admitting: Neurosurgery

## 2018-01-27 DIAGNOSIS — M542 Cervicalgia: Secondary | ICD-10-CM

## 2018-01-27 DIAGNOSIS — S065X9A Traumatic subdural hemorrhage with loss of consciousness of unspecified duration, initial encounter: Secondary | ICD-10-CM

## 2018-01-27 DIAGNOSIS — S065XAA Traumatic subdural hemorrhage with loss of consciousness status unknown, initial encounter: Secondary | ICD-10-CM

## 2018-01-28 ENCOUNTER — Emergency Department (HOSPITAL_BASED_OUTPATIENT_CLINIC_OR_DEPARTMENT_OTHER): Payer: Medicare Other

## 2018-01-28 ENCOUNTER — Emergency Department (HOSPITAL_COMMUNITY)
Admission: EM | Admit: 2018-01-28 | Discharge: 2018-01-28 | Disposition: A | Discharge status: Skilled Nursing Facility | Payer: Medicare Other | Attending: Emergency Medicine | Admitting: Emergency Medicine

## 2018-01-28 ENCOUNTER — Encounter (HOSPITAL_COMMUNITY): Payer: Self-pay

## 2018-01-28 ENCOUNTER — Emergency Department (HOSPITAL_COMMUNITY): Payer: Medicare Other

## 2018-01-28 ENCOUNTER — Other Ambulatory Visit: Payer: Self-pay

## 2018-01-28 DIAGNOSIS — J449 Chronic obstructive pulmonary disease, unspecified: Secondary | ICD-10-CM | POA: Diagnosis not present

## 2018-01-28 DIAGNOSIS — M7122 Synovial cyst of popliteal space [Baker], left knee: Secondary | ICD-10-CM | POA: Insufficient documentation

## 2018-01-28 DIAGNOSIS — M7989 Other specified soft tissue disorders: Secondary | ICD-10-CM | POA: Diagnosis not present

## 2018-01-28 DIAGNOSIS — R600 Localized edema: Secondary | ICD-10-CM | POA: Diagnosis not present

## 2018-01-28 DIAGNOSIS — N189 Chronic kidney disease, unspecified: Secondary | ICD-10-CM | POA: Diagnosis not present

## 2018-01-28 DIAGNOSIS — Z79899 Other long term (current) drug therapy: Secondary | ICD-10-CM | POA: Diagnosis not present

## 2018-01-28 DIAGNOSIS — I129 Hypertensive chronic kidney disease with stage 1 through stage 4 chronic kidney disease, or unspecified chronic kidney disease: Secondary | ICD-10-CM | POA: Diagnosis not present

## 2018-01-28 DIAGNOSIS — Z8546 Personal history of malignant neoplasm of prostate: Secondary | ICD-10-CM | POA: Diagnosis not present

## 2018-01-28 DIAGNOSIS — G309 Alzheimer's disease, unspecified: Secondary | ICD-10-CM | POA: Diagnosis not present

## 2018-01-28 DIAGNOSIS — M25462 Effusion, left knee: Secondary | ICD-10-CM | POA: Diagnosis not present

## 2018-01-28 DIAGNOSIS — M79662 Pain in left lower leg: Secondary | ICD-10-CM | POA: Diagnosis present

## 2018-01-28 DIAGNOSIS — Z87891 Personal history of nicotine dependence: Secondary | ICD-10-CM | POA: Insufficient documentation

## 2018-01-28 DIAGNOSIS — R52 Pain, unspecified: Secondary | ICD-10-CM

## 2018-01-28 MED ORDER — HYDROCODONE-ACETAMINOPHEN 5-325 MG PO TABS: 1.0000 | ORAL_TABLET | Freq: Four times a day (QID) | ORAL | 0 refills | Status: AC | PRN

## 2018-01-28 MED ORDER — HYDROCODONE-ACETAMINOPHEN 5-325 MG PO TABS
1.0000 | ORAL_TABLET | Freq: Once | ORAL | Status: AC
  Administered 2018-01-28: 1 via ORAL
  Filled 2018-01-28: qty 1

## 2018-01-28 NOTE — ED Provider Notes (Signed)
[facility] DEPT Provider Note   CSN: [id number] Arrival date & time: 01/28/18  1701     History   Chief Complaint Chief Complaint  Patient presents with  . Leg Swelling    L    HPI Lucas Vega is a 67 y.o. male history of CKD, COPD, Alzheimer's, from a nursing home here presenting with left leg pain.  Patient has been having left leg pain for the last several days.  Patient apparently had a mechanical fall and was evaluated in the ED will days ago but no leg were performed.  Patient was sent to [facility] sent to the ED for rule out DVT.  Is not currently on any blood thinners.  The history is provided by the patient.    Past Medical History:  Diagnosis Date  . Alzheimer's disease with early onset (CODE) ([place]) 2015  . Chronic kidney disease   . Convulsions/seizures ([place]) 07/05/2014   last one 1 1/2 years ago as of 12/08/17  . COPD (chronic obstructive pulmonary disease) ([place])   . Depression   . Gait abnormality 10/28/2017  . Gout   . History of kidney stones   . History of pulmonary embolism    after surgery ? 2004  . Hypertension   . Left foot drop 10/28/2017  . Memory disturbance 01/26/2013  . Pneumonia    as a child  . Pre-diabetes    took Metformin for a while, but after weight loss did not have take it anymore  . Prostate cancer ([place])   . RLS (restless legs syndrome)   . Sleep apnea    "mild case" - tried cpap but he couldn't use it.    Patient Active Problem List   Diagnosis Date Noted  . Myelopathy ([place]) 12/13/2017  . Gait abnormality 10/28/2017  . Left foot drop 10/28/2017  . Convulsions/seizures ([place]) 07/05/2014  . Right shoulder pain 07/05/2014  . Memory disturbance 01/26/2013    Past Surgical History:  Procedure Laterality Date  . ANTERIOR CERVICAL DECOMP/DISCECTOMY FUSION N/A 12/13/2017   Procedure: ANTERIOR CERVICAL DECOMPRESSION/DISCECTOMY FUSION CERVICAL THREE-CERVICAL FOUR;  Surgeon: Kary Kos, MD;  Location:  [facility];  Service: Neurosurgery;  Laterality: N/A;  ANTERIOR CERVICAL DECOMPRESSION/DISCECTOMY FUSION CERVICAL THREE-CERVICAL FOUR  . COLONOSCOPY  2009   JAN  . PROSTATECTOMY    . TONSILLECTOMY AND ADENOIDECTOMY          Home Medications    Prior to Admission medications   Medication Sig Start Date End Date Taking? Authorizing Provider  allopurinol (ZYLOPRIM) 100 MG tablet Take 100 mg by mouth daily.    [provider]  ALPRAZolam Duanne Moron) 0.25 MG tablet Take 0.25 mg by mouth 2 (two) times daily.     [provider]  aspirin 81 MG tablet Take 81 mg by mouth daily.    [provider]  atenolol-chlorthalidone (TENORETIC) 50-25 MG per tablet Take 1 tablet by mouth at bedtime.     [provider]  buPROPion (WELLBUTRIN) 75 MG tablet Take 75 mg by mouth daily.    [provider]  cyclobenzaprine (FLEXERIL) 10 MG tablet Take 1 tablet (10 mg total) by mouth 3 (three) times daily as needed for muscle spasms. 12/14/17   Meyran, Ocie Cornfield, NP  divalproex (DEPAKOTE) 250 MG DR tablet Take 250 mg by mouth 2 (two) times daily. 01/20/18   [provider]  divalproex (DEPAKOTE) 500 MG DR tablet TAKE 1 TABLET BY MOUTH TWICE DAILY Patient taking differently: Take 500  mg by mouth 2 (two) times daily.  09/01/17   Kathrynn Ducking, MD  HYDROcodone-acetaminophen (NORCO/VICODIN) 5-325 MG tablet Take 1-2 tablets by mouth every 4 (four) hours as needed for severe pain. 12/14/17   Meyran, Ocie Cornfield, NP  hydrOXYzine (ATARAX/VISTARIL) 25 MG tablet Take 1 tablet (25 mg total) by mouth 3 (three) times daily as needed. For agitation 01/22/18   Sater, Nanine Means, MD  KLOR-CON M20 20 MEQ tablet Take 20 mEq by mouth 2 (two) times daily.  02/18/16   [provider]  megestrol (MEGACE) 40 MG tablet Take 40 mg by mouth daily.     [provider]  memantine (NAMENDA) 10 MG tablet Take 1 tablet (10 mg total) by mouth 2 (two) times daily. 08/29/14    Ward Givens, NP  simvastatin (ZOCOR) 20 MG tablet Take 20 mg by mouth every evening.     [provider]  venlafaxine XR (EFFEXOR-XR) 150 MG 24 hr capsule Take 150 mg by mouth daily. 01/20/18   [provider]  venlafaxine XR (EFFEXOR-XR) 75 MG 24 hr capsule TAKE 1 CAPSULE BY MOUTH ONCE DAILY WITH BREAKFAST Patient taking differently: Take 75 mg by mouth daily with breakfast.  10/28/17   Kathrynn Ducking, MD    Family History Family History  Problem Relation Age of Onset  . Dementia Mother   . Pancreatic cancer Father   . Seizures Neg Hx     Social History Social History   Tobacco Use  . Smoking status: Former Smoker    Last attempt to quit: 09/29/2012    Years since quitting: 5.3  . Smokeless tobacco: Never Used  Substance Use Topics  . Alcohol use: Yes    Comment: occasional  . Drug use: No     Allergies   Patient has no known allergies.   Review of Systems Review of Systems  Musculoskeletal:       L leg pain   All other systems reviewed and are negative.    Physical Exam Updated Vital Signs BP 126/86   Pulse (!) 38   Temp 98.4 F (36.9 C) (Oral)   Resp 16   SpO2 93%   Physical Exam  Constitutional: He is oriented to person, place, and time. He appears well-developed.  HENT:  Head: Normocephalic.  Mouth/Throat: Oropharynx is clear and moist.  Eyes: Pupils are equal, round, and reactive to light. Conjunctivae and EOM are normal.  Neck: Normal range of motion. Neck supple.  Cardiovascular: Normal rate, regular rhythm and normal heart sounds.  Pulmonary/Chest: Effort normal and breath sounds normal. No stridor. No respiratory distress.  Abdominal: Soft. Bowel sounds are normal. He exhibits no distension. There is no tenderness.  Musculoskeletal:  L foot 2+ edema, mild L calf tenderness. 2+ pulses. No obvious signs of gout or septic joint   Neurological: He is alert and oriented to person, place, and time.  Skin: Skin is warm.  Capillary refill takes less than 2 seconds.  Psychiatric: He has a normal mood and affect.  Nursing note and vitals reviewed.    ED Treatments / Results  Labs (all labs ordered are listed, but only abnormal results are displayed) Labs Reviewed - No data to display  EKG None  Radiology Dg Tibia/fibula Left  Result Date: 01/28/2018 CLINICAL DATA:  Fall with left calf pain and swelling. Initial encounter. EXAM: LEFT TIBIA AND FIBULA - 2 VIEW COMPARISON:  None. FINDINGS: Knee joint effusion is seen on the lateral view. No fracture or  dislocation. There is nonspecific generalized subcutaneous reticulation. IMPRESSION: 1. No acute osseous finding. 2. Joint effusion and nonspecific soft tissue swelling. Electronically Signed   By: Monte Fantasia M.D.   On: 01/28/2018 18:45   Dg Foot Complete Left  Result Date: 01/28/2018 CLINICAL DATA:  Left foot swelling EXAM: LEFT FOOT - COMPLETE 3+ VIEW COMPARISON:  None. FINDINGS: Nonspecific soft tissue swelling. No fracture, acute erosion, or malalignment. Hallux valgus and first MTP osteoarthritis. There is a juxta-articular corticated erosion at the first metatarsal head. IMPRESSION: 1. Soft tissue swelling without acute osseous finding. 2. Juxta-articular erosion correlating with history of gout. Electronically Signed   By: Monte Fantasia M.D.   On: 01/28/2018 18:47    Procedures Procedures (including critical care time)  Medications Ordered in ED Medications - No data to display   Initial Impression / Assessment and Plan / ED Course  I have reviewed the triage vital signs and the nursing notes.  Pertinent labs & imaging results that were available during my care of the patient were reviewed by me and considered in my medical decision making (see chart for details).    Lucas Vega is a 67 y.o. male here with L leg swelling and L foot pain. Consider DVT vs contusion or fracture after fall. Will get DVT study, xrays.   6:58 PM DVT  study negative, but there is large baker's cyst. xrays unremarkable. Good pedal pulses. Ace wrap applied, will give several pills of vicodin for pain. Will refer to ortho for follow up    Final Clinical Impressions(s) / ED Diagnoses   Final diagnoses:  None    ED Discharge Orders    None       Drenda Freeze, MD 01/28/18 1859

## 2018-01-28 NOTE — Discharge Instructions (Addendum)
Use ace wrap, keep leg elevated. You have a baker's cyst that is causing your pain and swelling.   Take tylenol for pain   Take vicodin as prescribed for severe pain   See orthopedic doctor for follow up   Return to ER if you have worse leg pain and swelling.

## 2018-01-28 NOTE — ED Triage Notes (Signed)
[name] from [facility] for evaluation of L foot, leg and calf swelling and pain. Concerned for DVT. Pt has alzheimers and lives at [facility].

## 2018-01-28 NOTE — ED Notes (Signed)
Coming from [facility] in [facility]-needs eval for LLE DVT

## 2018-01-28 NOTE — Progress Notes (Signed)
LLE venous duplex prelim: negative for DVT. Large popliteal cyst noted.  Landry Mellow, RDMS, RVT

## 2018-01-28 NOTE — ED Notes (Signed)
Pt had soiled and wet diaper, this RN changed pt

## 2018-03-05 ENCOUNTER — Emergency Department (HOSPITAL_COMMUNITY)
Admission: EM | Admit: 2018-03-05 | Discharge: 2018-03-05 | Disposition: A | Discharge status: Other Acute Inpatient Hospital | Payer: Medicare Other | Attending: Emergency Medicine | Admitting: Emergency Medicine

## 2018-03-05 ENCOUNTER — Other Ambulatory Visit: Payer: Self-pay

## 2018-03-05 ENCOUNTER — Encounter (HOSPITAL_COMMUNITY): Payer: Self-pay

## 2018-03-05 DIAGNOSIS — G309 Alzheimer's disease, unspecified: Secondary | ICD-10-CM | POA: Insufficient documentation

## 2018-03-05 DIAGNOSIS — J449 Chronic obstructive pulmonary disease, unspecified: Secondary | ICD-10-CM | POA: Insufficient documentation

## 2018-03-05 DIAGNOSIS — Z79899 Other long term (current) drug therapy: Secondary | ICD-10-CM | POA: Diagnosis not present

## 2018-03-05 DIAGNOSIS — N189 Chronic kidney disease, unspecified: Secondary | ICD-10-CM | POA: Diagnosis not present

## 2018-03-05 DIAGNOSIS — Z87891 Personal history of nicotine dependence: Secondary | ICD-10-CM | POA: Insufficient documentation

## 2018-03-05 DIAGNOSIS — Z7982 Long term (current) use of aspirin: Secondary | ICD-10-CM | POA: Diagnosis not present

## 2018-03-05 DIAGNOSIS — R52 Pain, unspecified: Secondary | ICD-10-CM | POA: Diagnosis not present

## 2018-03-05 DIAGNOSIS — Z8546 Personal history of malignant neoplasm of prostate: Secondary | ICD-10-CM | POA: Insufficient documentation

## 2018-03-05 DIAGNOSIS — M7121 Synovial cyst of popliteal space [Baker], right knee: Secondary | ICD-10-CM | POA: Insufficient documentation

## 2018-03-05 DIAGNOSIS — I129 Hypertensive chronic kidney disease with stage 1 through stage 4 chronic kidney disease, or unspecified chronic kidney disease: Secondary | ICD-10-CM | POA: Insufficient documentation

## 2018-03-05 DIAGNOSIS — M25561 Pain in right knee: Secondary | ICD-10-CM | POA: Diagnosis present

## 2018-03-05 MED ORDER — DICLOFENAC SODIUM 1 % TD GEL: 4.0000 g | Freq: Three times a day (TID) | TRANSDERMAL | 0 refills | Status: AC | PRN

## 2018-03-05 MED ORDER — HYDROCODONE-ACETAMINOPHEN 5-325 MG PO TABS
1.0000 | ORAL_TABLET | Freq: Once | ORAL | Status: DC
  Filled 2018-03-05: qty 1

## 2018-03-05 MED ORDER — ACETAMINOPHEN 500 MG PO TABS: 500.0000 mg | ORAL_TABLET | ORAL | 0 refills | Status: DC | PRN

## 2018-03-05 MED ORDER — ACETAMINOPHEN 500 MG PO TABS: 500.0000 mg | ORAL_TABLET | ORAL | 0 refills | Status: AC | PRN

## 2018-03-05 MED ORDER — DICLOFENAC SODIUM 1 % TD GEL: 4.0000 g | Freq: Four times a day (QID) | TRANSDERMAL | 0 refills | Status: DC

## 2018-03-05 NOTE — ED Notes (Signed)
[facility] has arrived. 

## 2018-03-05 NOTE — ED Notes (Signed)
Patient has been changed and cleaned. Patient provided with new diaper and blue paper scrub pants.

## 2018-03-05 NOTE — ED Notes (Signed)
[facility] has been called  

## 2018-03-05 NOTE — ED Notes (Signed)
Bed: [facility] Expected date:  Expected time:  Means of arrival:  Comments: EMS 67 yo male from SNF knee pain from bakers cyst

## 2018-03-05 NOTE — ED Triage Notes (Signed)
Per EMS, patient has cyst on back of his knee that is causing pain. SNF could not administer OTC pain medications without a doctors order and that takes a few days to obtain so family requested for patient to be transported to the hospital.  Patient has dementia and is slightly altered at baseline. SNF reports that he is normally oriented to person only.

## 2018-03-05 NOTE — ED Provider Notes (Signed)
[facility] DE[facility] Provider Note   CSN: [id number] Arrival date & time: 03/05/18  4193     History   Chief Complaint Chief Complaint  Patient presents with  . Knee Pain    HPI Lucas Vega is a 67 y.o. male.  HPI 67 year old male with past medical history as below including Alzheimer's disease, recently diagnosed painful Baker's cyst of the right knee, here with pain of the right knee.  Per report, the patient has been complaining of increased pain in his popliteal area.  This is a known issue for him.  However, because there was no physician at the facility over the weekend, and he did not have PRN pain meds ordered, his family became concerned and decided to send him to the ER.  On my assessment, due to his dementia, he is unable to provide significant history.  He denies any pain.  Denies any pain with passive range of his knee.  Level 5 caveat invoked as remainder of history, ROS, and physical exam limited due to patient's knee pain.   Past Medical History:  Diagnosis Date  . Alzheimer's disease with early onset (CODE) ([place]) 2015  . Chronic kidney disease   . Convulsions/seizures ([place]) 07/05/2014   last one 1 1/2 years ago as of 12/08/17  . COPD (chronic obstructive pulmonary disease) ([place])   . Depression   . Gait abnormality 10/28/2017  . Gout   . History of kidney stones   . History of pulmonary embolism    after surgery ? 2004  . Hypertension   . Left foot drop 10/28/2017  . Memory disturbance 01/26/2013  . Pneumonia    as a child  . Pre-diabetes    took Metformin for a while, but after weight loss did not have take it anymore  . Prostate cancer ([place])   . RLS (restless legs syndrome)   . Sleep apnea    "mild case" - tried cpap but he couldn't use it.    Patient Active Problem List   Diagnosis Date Noted  . Myelopathy ([place]) 12/13/2017  . Gait abnormality 10/28/2017  . Left foot drop 10/28/2017  . Convulsions/seizures ([place])  07/05/2014  . Right shoulder pain 07/05/2014  . Memory disturbance 01/26/2013    Past Surgical History:  Procedure Laterality Date  . ANTERIOR CERVICAL DECOMP/DISCECTOMY FUSION N/A 12/13/2017   Procedure: ANTERIOR CERVICAL DECOMPRESSION/DISCECTOMY FUSION CERVICAL THREE-CERVICAL FOUR;  Surgeon: Kary Kos, MD;  Location: [facility];  Service: Neurosurgery;  Laterality: N/A;  ANTERIOR CERVICAL DECOMPRESSION/DISCECTOMY FUSION CERVICAL THREE-CERVICAL FOUR  . COLONOSCOPY  2009   JAN  . PROSTATECTOMY    . TONSILLECTOMY AND ADENOIDECTOMY          Home Medications    Prior to Admission medications   Medication Sig Start Date End Date Taking? Authorizing Provider  acetaminophen (TYLENOL) 500 MG tablet Take 1 tablet (500 mg total) by mouth every 4 (four) hours as needed for up to 10 days for moderate pain. 03/05/18 03/15/18  Duffy Bruce, MD  allopurinol (ZYLOPRIM) 100 MG tablet Take 100 mg by mouth daily.    [provider]  ALPRAZolam Duanne Moron) 0.25 MG tablet Take 0.25 mg by mouth 2 (two) times daily.     [provider]  aspirin 81 MG tablet Take 81 mg by mouth daily.    [provider]  atenolol-chlorthalidone (TENORETIC) 50-25 MG per tablet Take 1 tablet by mouth at bedtime.     [provider]  buPROPion [facility]) 75  MG tablet Take 75 mg by mouth daily.    [provider]  cyclobenzaprine (FLEXERIL) 10 MG tablet Take 1 tablet (10 mg total) by mouth 3 (three) times daily as needed for muscle spasms. 12/14/17   Meyran, Ocie Cornfield, NP  diclofenac sodium (VOLTAREN) 1 % GEL Apply 4 g topically 3 (three) times daily as needed for up to 7 days (knee pain). 03/05/18 03/12/18  Duffy Bruce, MD  divalproex (DEPAKOTE) 250 MG DR tablet Take 250 mg by mouth 2 (two) times daily. 01/20/18   [provider]  divalproex (DEPAKOTE) 500 MG DR tablet TAKE 1 TABLET BY MOUTH TWICE DAILY Patient taking differently: Take 500 mg by mouth 2 (two) times daily.   09/01/17   Kathrynn Ducking, MD  HYDROcodone-acetaminophen (NORCO/VICODIN) 5-325 MG tablet Take 1 tablet by mouth every 6 (six) hours as needed for severe pain. 01/28/18   Drenda Freeze, MD  hydrOXYzine (ATARAX/VISTARIL) 25 MG tablet Take 1 tablet (25 mg total) by mouth 3 (three) times daily as needed. For agitation 01/22/18   Sater, Nanine Means, MD  KLOR-CON M20 20 MEQ tablet Take 20 mEq by mouth 2 (two) times daily.  02/18/16   [provider]  megestrol (MEGACE) 40 MG tablet Take 40 mg by mouth daily.     [provider]  memantine (NAMENDA) 10 MG tablet Take 1 tablet (10 mg total) by mouth 2 (two) times daily. 08/29/14   Ward Givens, NP  simvastatin (ZOCOR) 20 MG tablet Take 20 mg by mouth every evening.     [provider]  venlafaxine XR (EFFEXOR-XR) 150 MG 24 hr capsule Take 150 mg by mouth daily. 01/20/18   [provider]  venlafaxine XR (EFFEXOR-XR) 75 MG 24 hr capsule TAKE 1 CAPSULE BY MOUTH ONCE DAILY WITH BREAKFAST Patient taking differently: Take 75 mg by mouth daily with breakfast.  10/28/17   Kathrynn Ducking, MD    Family History Family History  Problem Relation Age of Onset  . Dementia Mother   . Pancreatic cancer Father   . Seizures Neg Hx     Social History Social History   Tobacco Use  . Smoking status: Former Smoker    Last attempt to quit: 09/29/2012    Years since quitting: 5.4  . Smokeless tobacco: Never Used  Substance Use Topics  . Alcohol use: Yes    Comment: occasional  . Drug use: No     Allergies   Patient has no known allergies.   Review of Systems Review of Systems  Unable to perform ROS: Dementia  Musculoskeletal: Negative for arthralgias and gait problem.     Physical Exam Updated Vital Signs BP 127/77 (BP Location: Right Arm)   Pulse 86   Resp 16   Ht 5\' 9"  (1.753 m)   Wt 79.4 kg   SpO2 100%   BMI 25.84 kg/m   Physical Exam Vitals signs and nursing note reviewed.  Constitutional:        General: He is not in acute distress.    Appearance: He is well-developed.  HENT:     Head: Normocephalic and atraumatic.  Eyes:     Conjunctiva/sclera: Conjunctivae normal.  Neck:     Musculoskeletal: Neck supple.  Cardiovascular:     Rate and Rhythm: Normal rate and regular rhythm.     Heart sounds: Normal heart sounds.  Pulmonary:     Effort: Pulmonary effort is normal. No respiratory distress.     Breath sounds: No  wheezing.  Abdominal:     General: There is no distension.  Skin:    General: Skin is warm.     Capillary Refill: Capillary refill takes less than 2 seconds.     Findings: No rash.  Neurological:     Mental Status: He is alert.     Motor: No abnormal muscle tone.     Comments: Oriented to person only, which is baseline. Pleasant, alert, interactive. MAE.     LOWER EXTREMITY EXAM: RIGHT  INSPECTION & PALPATION: Moderate TTP over R popliteal area, no warmth or redness. No fluctuance. No open wounds or drainage. No pain with pROM. No calf TTP or leg edema or asymmetry.  SENSORY: sensation is intact to light touch in:  Superficial peroneal nerve distribution (over dorsum of foot) Deep peroneal nerve distribution (over first dorsal web space) Sural nerve distribution (over lateral aspect 5th metatarsal) Saphenous nerve distribution (over medial instep)  MOTOR:  + Motor EHL (great toe dorsiflexion) + FHL (great toe plantar flexion)  + TA (ankle dorsiflexion)  + GSC (ankle plantar flexion)  VASCULAR: 2+ dorsalis pedis and posterior tibialis pulses Capillary refill < 2 sec, toes warm and well-perfused  COMPARTMENTS: Soft, warm, well-perfused No pain with passive extension No parethesias    ED Treatments / Results  Labs (all labs ordered are listed, but only abnormal results are displayed) Labs Reviewed - No data to display  EKG None  Radiology No results found.  Procedures Procedures (including critical care time)  Medications Ordered  in ED Medications  HYDROcodone-acetaminophen (NORCO/VICODIN) 5-325 MG per tablet 1 tablet (1 tablet Oral Refused 03/05/18 0557)     Initial Impression / Assessment and Plan / ED Course  I have reviewed the triage vital signs and the nursing notes.  Pertinent labs & imaging results that were available during my care of the patient were reviewed by me and considered in my medical decision making (see chart for details).     67 yo M here with pain in popliteal area 2/2 known Baker's cyst. Clinically he has no evidence of cellulitis or septic arthritis. He has TTP over Baker's cyst but no posterior calf TTP, leg edema, or sx of DVT. No other complaints though limited 2/2 dementia. Per Facility, pt just needs OTC med orders for weekend pain control. Will write for Tylenol, also trial Diclofenac gel. D/c back to facility.  Final Clinical Impressions(s) / ED Diagnoses   Final diagnoses:  Synovial cyst of right popliteal space    ED Discharge Orders         Ordered    diclofenac sodium (VOLTAREN) 1 % GEL  4 times daily,   Status:  Discontinued     03/05/18 0726    acetaminophen (TYLENOL) 500 MG tablet  Every 4 hours PRN,   Status:  Discontinued     03/05/18 0726    acetaminophen (TYLENOL) 500 MG tablet  Every 4 hours PRN     03/05/18 0727    diclofenac sodium (VOLTAREN) 1 % GEL  3 times daily PRN     03/05/18 0727           Duffy Bruce, MD 03/05/18 0800

## 2018-03-18 DIAGNOSIS — F1097 Alcohol use, unspecified with alcohol-induced persisting dementia: Secondary | ICD-10-CM | POA: Diagnosis not present

## 2018-03-18 DIAGNOSIS — F331 Major depressive disorder, recurrent, moderate: Secondary | ICD-10-CM | POA: Diagnosis not present

## 2018-03-18 DIAGNOSIS — G4701 Insomnia due to medical condition: Secondary | ICD-10-CM | POA: Diagnosis not present

## 2018-03-18 DIAGNOSIS — Z5181 Encounter for therapeutic drug level monitoring: Secondary | ICD-10-CM | POA: Diagnosis not present

## 2018-03-18 DIAGNOSIS — F064 Anxiety disorder due to known physiological condition: Secondary | ICD-10-CM | POA: Diagnosis not present

## 2018-03-22 DIAGNOSIS — M25521 Pain in right elbow: Secondary | ICD-10-CM | POA: Diagnosis not present

## 2018-03-23 DIAGNOSIS — Z5181 Encounter for therapeutic drug level monitoring: Secondary | ICD-10-CM | POA: Diagnosis not present

## 2018-03-23 DIAGNOSIS — I10 Essential (primary) hypertension: Secondary | ICD-10-CM | POA: Diagnosis not present

## 2018-03-23 DIAGNOSIS — E782 Mixed hyperlipidemia: Secondary | ICD-10-CM | POA: Diagnosis not present

## 2018-03-30 DIAGNOSIS — F419 Anxiety disorder, unspecified: Secondary | ICD-10-CM | POA: Diagnosis not present

## 2018-04-06 DIAGNOSIS — R2689 Other abnormalities of gait and mobility: Secondary | ICD-10-CM | POA: Diagnosis not present

## 2018-04-06 DIAGNOSIS — R569 Unspecified convulsions: Secondary | ICD-10-CM | POA: Diagnosis not present

## 2018-04-06 DIAGNOSIS — M21372 Foot drop, left foot: Secondary | ICD-10-CM | POA: Diagnosis not present

## 2018-04-06 DIAGNOSIS — G3281 Cerebellar ataxia in diseases classified elsewhere: Secondary | ICD-10-CM | POA: Diagnosis not present

## 2018-04-06 DIAGNOSIS — R2681 Unsteadiness on feet: Secondary | ICD-10-CM | POA: Diagnosis not present

## 2018-04-06 DIAGNOSIS — R296 Repeated falls: Secondary | ICD-10-CM | POA: Diagnosis not present

## 2018-04-08 DIAGNOSIS — Z7982 Long term (current) use of aspirin: Secondary | ICD-10-CM | POA: Diagnosis not present

## 2018-04-08 DIAGNOSIS — E785 Hyperlipidemia, unspecified: Secondary | ICD-10-CM | POA: Diagnosis not present

## 2018-04-08 DIAGNOSIS — I10 Essential (primary) hypertension: Secondary | ICD-10-CM | POA: Diagnosis not present

## 2018-04-08 DIAGNOSIS — R52 Pain, unspecified: Secondary | ICD-10-CM | POA: Diagnosis not present

## 2018-04-08 DIAGNOSIS — F445 Conversion disorder with seizures or convulsions: Secondary | ICD-10-CM | POA: Diagnosis not present

## 2018-04-08 DIAGNOSIS — M6281 Muscle weakness (generalized): Secondary | ICD-10-CM | POA: Diagnosis not present

## 2018-04-08 DIAGNOSIS — F322 Major depressive disorder, single episode, severe without psychotic features: Secondary | ICD-10-CM | POA: Diagnosis not present

## 2018-04-08 DIAGNOSIS — F5000 Anorexia nervosa, unspecified: Secondary | ICD-10-CM | POA: Diagnosis not present

## 2018-04-08 DIAGNOSIS — G309 Alzheimer's disease, unspecified: Secondary | ICD-10-CM | POA: Diagnosis not present

## 2018-04-08 DIAGNOSIS — M62838 Other muscle spasm: Secondary | ICD-10-CM | POA: Diagnosis not present

## 2018-04-08 DIAGNOSIS — E876 Hypokalemia: Secondary | ICD-10-CM | POA: Diagnosis not present

## 2018-04-08 DIAGNOSIS — M1000 Idiopathic gout, unspecified site: Secondary | ICD-10-CM | POA: Diagnosis not present

## 2018-04-11 DIAGNOSIS — G309 Alzheimer's disease, unspecified: Secondary | ICD-10-CM | POA: Diagnosis not present

## 2018-04-11 DIAGNOSIS — M62838 Other muscle spasm: Secondary | ICD-10-CM | POA: Diagnosis not present

## 2018-04-11 DIAGNOSIS — E876 Hypokalemia: Secondary | ICD-10-CM | POA: Diagnosis not present

## 2018-04-11 DIAGNOSIS — Z7982 Long term (current) use of aspirin: Secondary | ICD-10-CM | POA: Diagnosis not present

## 2018-04-11 DIAGNOSIS — E785 Hyperlipidemia, unspecified: Secondary | ICD-10-CM | POA: Diagnosis not present

## 2018-04-11 DIAGNOSIS — R52 Pain, unspecified: Secondary | ICD-10-CM | POA: Diagnosis not present

## 2018-04-11 DIAGNOSIS — M1000 Idiopathic gout, unspecified site: Secondary | ICD-10-CM | POA: Diagnosis not present

## 2018-04-11 DIAGNOSIS — F445 Conversion disorder with seizures or convulsions: Secondary | ICD-10-CM | POA: Diagnosis not present

## 2018-04-11 DIAGNOSIS — M6281 Muscle weakness (generalized): Secondary | ICD-10-CM | POA: Diagnosis not present

## 2018-04-11 DIAGNOSIS — I10 Essential (primary) hypertension: Secondary | ICD-10-CM | POA: Diagnosis not present

## 2018-04-11 DIAGNOSIS — F5000 Anorexia nervosa, unspecified: Secondary | ICD-10-CM | POA: Diagnosis not present

## 2018-04-11 DIAGNOSIS — F322 Major depressive disorder, single episode, severe without psychotic features: Secondary | ICD-10-CM | POA: Diagnosis not present

## 2018-04-12 DIAGNOSIS — R569 Unspecified convulsions: Secondary | ICD-10-CM | POA: Diagnosis not present

## 2018-04-12 DIAGNOSIS — R630 Anorexia: Secondary | ICD-10-CM | POA: Diagnosis not present

## 2018-04-12 DIAGNOSIS — Z7982 Long term (current) use of aspirin: Secondary | ICD-10-CM | POA: Diagnosis not present

## 2018-04-12 DIAGNOSIS — E876 Hypokalemia: Secondary | ICD-10-CM | POA: Diagnosis not present

## 2018-04-12 DIAGNOSIS — G4700 Insomnia, unspecified: Secondary | ICD-10-CM | POA: Diagnosis not present

## 2018-04-12 DIAGNOSIS — E785 Hyperlipidemia, unspecified: Secondary | ICD-10-CM | POA: Diagnosis not present

## 2018-04-12 DIAGNOSIS — R52 Pain, unspecified: Secondary | ICD-10-CM | POA: Diagnosis not present

## 2018-04-12 DIAGNOSIS — I10 Essential (primary) hypertension: Secondary | ICD-10-CM | POA: Diagnosis not present

## 2018-04-12 DIAGNOSIS — F322 Major depressive disorder, single episode, severe without psychotic features: Secondary | ICD-10-CM | POA: Diagnosis not present

## 2018-04-12 DIAGNOSIS — F419 Anxiety disorder, unspecified: Secondary | ICD-10-CM | POA: Diagnosis not present

## 2018-04-12 DIAGNOSIS — M6281 Muscle weakness (generalized): Secondary | ICD-10-CM | POA: Diagnosis not present

## 2018-04-12 DIAGNOSIS — M1000 Idiopathic gout, unspecified site: Secondary | ICD-10-CM | POA: Diagnosis not present

## 2018-04-13 DIAGNOSIS — N492 Inflammatory disorders of scrotum: Secondary | ICD-10-CM | POA: Diagnosis not present

## 2018-04-15 DIAGNOSIS — M62838 Other muscle spasm: Secondary | ICD-10-CM | POA: Diagnosis not present

## 2018-04-15 DIAGNOSIS — R52 Pain, unspecified: Secondary | ICD-10-CM | POA: Diagnosis not present

## 2018-04-15 DIAGNOSIS — G309 Alzheimer's disease, unspecified: Secondary | ICD-10-CM | POA: Diagnosis not present

## 2018-04-15 DIAGNOSIS — M1000 Idiopathic gout, unspecified site: Secondary | ICD-10-CM | POA: Diagnosis not present

## 2018-04-15 DIAGNOSIS — G4700 Insomnia, unspecified: Secondary | ICD-10-CM | POA: Diagnosis not present

## 2018-04-15 DIAGNOSIS — E785 Hyperlipidemia, unspecified: Secondary | ICD-10-CM | POA: Diagnosis not present

## 2018-04-15 DIAGNOSIS — Z7982 Long term (current) use of aspirin: Secondary | ICD-10-CM | POA: Diagnosis not present

## 2018-04-15 DIAGNOSIS — F322 Major depressive disorder, single episode, severe without psychotic features: Secondary | ICD-10-CM | POA: Diagnosis not present

## 2018-04-15 DIAGNOSIS — L853 Xerosis cutis: Secondary | ICD-10-CM | POA: Diagnosis not present

## 2018-04-15 DIAGNOSIS — M6281 Muscle weakness (generalized): Secondary | ICD-10-CM | POA: Diagnosis not present

## 2018-04-15 DIAGNOSIS — E876 Hypokalemia: Secondary | ICD-10-CM | POA: Diagnosis not present

## 2018-04-15 DIAGNOSIS — I10 Essential (primary) hypertension: Secondary | ICD-10-CM | POA: Diagnosis not present

## 2018-04-18 DIAGNOSIS — E876 Hypokalemia: Secondary | ICD-10-CM | POA: Diagnosis not present

## 2018-04-18 DIAGNOSIS — M62838 Other muscle spasm: Secondary | ICD-10-CM | POA: Diagnosis not present

## 2018-04-18 DIAGNOSIS — N451 Epididymitis: Secondary | ICD-10-CM | POA: Diagnosis not present

## 2018-04-18 DIAGNOSIS — Z7982 Long term (current) use of aspirin: Secondary | ICD-10-CM | POA: Diagnosis not present

## 2018-04-18 DIAGNOSIS — E785 Hyperlipidemia, unspecified: Secondary | ICD-10-CM | POA: Diagnosis not present

## 2018-04-18 DIAGNOSIS — G309 Alzheimer's disease, unspecified: Secondary | ICD-10-CM | POA: Diagnosis not present

## 2018-04-18 DIAGNOSIS — I10 Essential (primary) hypertension: Secondary | ICD-10-CM | POA: Diagnosis not present

## 2018-04-18 DIAGNOSIS — M6281 Muscle weakness (generalized): Secondary | ICD-10-CM | POA: Diagnosis not present

## 2018-04-18 DIAGNOSIS — R64 Cachexia: Secondary | ICD-10-CM | POA: Diagnosis not present

## 2018-04-18 DIAGNOSIS — R52 Pain, unspecified: Secondary | ICD-10-CM | POA: Diagnosis not present

## 2018-04-18 DIAGNOSIS — L853 Xerosis cutis: Secondary | ICD-10-CM | POA: Diagnosis not present

## 2018-04-18 DIAGNOSIS — M1000 Idiopathic gout, unspecified site: Secondary | ICD-10-CM | POA: Diagnosis not present

## 2018-04-20 DIAGNOSIS — M1000 Idiopathic gout, unspecified site: Secondary | ICD-10-CM | POA: Diagnosis not present

## 2018-04-20 DIAGNOSIS — L853 Xerosis cutis: Secondary | ICD-10-CM | POA: Diagnosis not present

## 2018-04-20 DIAGNOSIS — M6281 Muscle weakness (generalized): Secondary | ICD-10-CM | POA: Diagnosis not present

## 2018-04-20 DIAGNOSIS — M62838 Other muscle spasm: Secondary | ICD-10-CM | POA: Diagnosis not present

## 2018-04-20 DIAGNOSIS — Z7982 Long term (current) use of aspirin: Secondary | ICD-10-CM | POA: Diagnosis not present

## 2018-04-20 DIAGNOSIS — R635 Abnormal weight gain: Secondary | ICD-10-CM | POA: Diagnosis not present

## 2018-04-20 DIAGNOSIS — E876 Hypokalemia: Secondary | ICD-10-CM | POA: Diagnosis not present

## 2018-04-20 DIAGNOSIS — I10 Essential (primary) hypertension: Secondary | ICD-10-CM | POA: Diagnosis not present

## 2018-04-20 DIAGNOSIS — M79669 Pain in unspecified lower leg: Secondary | ICD-10-CM | POA: Diagnosis not present

## 2018-04-20 DIAGNOSIS — E785 Hyperlipidemia, unspecified: Secondary | ICD-10-CM | POA: Diagnosis not present

## 2018-04-20 DIAGNOSIS — G309 Alzheimer's disease, unspecified: Secondary | ICD-10-CM | POA: Diagnosis not present

## 2018-04-20 DIAGNOSIS — R52 Pain, unspecified: Secondary | ICD-10-CM | POA: Diagnosis not present

## 2018-04-25 DIAGNOSIS — F419 Anxiety disorder, unspecified: Secondary | ICD-10-CM | POA: Diagnosis not present

## 2018-04-25 DIAGNOSIS — F339 Major depressive disorder, recurrent, unspecified: Secondary | ICD-10-CM | POA: Diagnosis not present

## 2018-04-27 DIAGNOSIS — F331 Major depressive disorder, recurrent, moderate: Secondary | ICD-10-CM | POA: Diagnosis not present

## 2018-04-27 DIAGNOSIS — F1097 Alcohol use, unspecified with alcohol-induced persisting dementia: Secondary | ICD-10-CM | POA: Diagnosis not present

## 2018-04-27 DIAGNOSIS — F064 Anxiety disorder due to known physiological condition: Secondary | ICD-10-CM | POA: Diagnosis not present

## 2018-04-27 DIAGNOSIS — G4701 Insomnia due to medical condition: Secondary | ICD-10-CM | POA: Diagnosis not present

## 2018-04-28 DIAGNOSIS — F331 Major depressive disorder, recurrent, moderate: Secondary | ICD-10-CM | POA: Diagnosis not present

## 2018-05-03 DIAGNOSIS — E785 Hyperlipidemia, unspecified: Secondary | ICD-10-CM | POA: Diagnosis not present

## 2018-05-03 DIAGNOSIS — M25561 Pain in right knee: Secondary | ICD-10-CM | POA: Diagnosis not present

## 2018-05-03 DIAGNOSIS — I10 Essential (primary) hypertension: Secondary | ICD-10-CM | POA: Diagnosis not present

## 2018-05-03 DIAGNOSIS — M6281 Muscle weakness (generalized): Secondary | ICD-10-CM | POA: Diagnosis not present

## 2018-05-03 DIAGNOSIS — Z7982 Long term (current) use of aspirin: Secondary | ICD-10-CM | POA: Diagnosis not present

## 2018-05-03 DIAGNOSIS — F419 Anxiety disorder, unspecified: Secondary | ICD-10-CM | POA: Diagnosis not present

## 2018-05-03 DIAGNOSIS — E876 Hypokalemia: Secondary | ICD-10-CM | POA: Diagnosis not present

## 2018-05-03 DIAGNOSIS — G4700 Insomnia, unspecified: Secondary | ICD-10-CM | POA: Diagnosis not present

## 2018-05-03 DIAGNOSIS — M1000 Idiopathic gout, unspecified site: Secondary | ICD-10-CM | POA: Diagnosis not present

## 2018-05-03 DIAGNOSIS — L853 Xerosis cutis: Secondary | ICD-10-CM | POA: Diagnosis not present

## 2018-05-03 DIAGNOSIS — F322 Major depressive disorder, single episode, severe without psychotic features: Secondary | ICD-10-CM | POA: Diagnosis not present

## 2018-05-03 DIAGNOSIS — R52 Pain, unspecified: Secondary | ICD-10-CM | POA: Diagnosis not present

## 2018-05-04 DIAGNOSIS — G4701 Insomnia due to medical condition: Secondary | ICD-10-CM | POA: Diagnosis not present

## 2018-05-04 DIAGNOSIS — F064 Anxiety disorder due to known physiological condition: Secondary | ICD-10-CM | POA: Diagnosis not present

## 2018-05-04 DIAGNOSIS — F1097 Alcohol use, unspecified with alcohol-induced persisting dementia: Secondary | ICD-10-CM | POA: Diagnosis not present

## 2018-05-04 DIAGNOSIS — F331 Major depressive disorder, recurrent, moderate: Secondary | ICD-10-CM | POA: Diagnosis not present

## 2018-05-06 ENCOUNTER — Encounter: Payer: Self-pay | Admitting: Rehabilitation

## 2018-05-06 NOTE — Therapy (Signed)
[facility] [address] [place], [place], [address] Phone: [phone]   Fax:  [phone]  Patient Details  Name: Lucas Vega MRN: [id number] Date of Birth: 1951-02-10 Referring Provider:  No ref. provider found  Encounter Date: 05/06/2018   PHYSICAL THERAPY DISCHARGE SUMMARY  Visits from Start of Care: 10  Current functional level related to goals / functional outcomes:  Unsure as pt did not return for follow up session.  Wife given education on ensuring pts safety, and respite care.   Remaining deficits: PT Long Term Goals - 08/21/17 0740      PT LONG TERM GOAL #1   Title  Patient will be mod I (wife assistance for memory) with long term HEP/fitness program to maintain progress made during PT session. (ALL LTGS TARGET DATE  08/20/2017)    Baseline  not fully compliant at this time due to moving from apt to condo and then to house to renovations    Status  Not Met      PT LONG TERM GOAL #2   Title  Patient will complete DGI with a score of >/=19/24 to reduce falls risk     Baseline  11/24 on 08/16/17    Status  Not Met      PT LONG TERM GOAL #3   Title  Patient will complete cognitive TUG with a reduction in time of</=4.0 seconds in order to reduces falls risk     Baseline  31.4 secs with quad tip cane which is an increase in time from eval     Status  Not Met      PT LONG TERM GOAL #4   Title  Patient will ambulate 573f outdoors over grass and pavement with LRAD and supervision level in order to further reduce fall risk in limited community ambulation.     Baseline  Pt requires min/guard to min A for all outdoor gait    Status  Not Met      PT LONG TERM GOAL #5   Title  Pt/wife will demonstrate/verbalize understanding of HEP/gym program and have contacted respite servives/day program to decrease burden of care.     Time  1    Period  Months    Status  New    Target Date  09/20/17          Education / Equipment: HEP  Plan: Patient agrees to discharge.  Patient goals were not met. Patient is being discharged due to not returning since the last visit.  ?????    ECameron Sprang PT, MPT CMahoning Valley Ambulatory Surgery [facility] Inc[address] Briarwood StreetSCarnegieGLoraine NAlaska [address]Phone: [phone]  Fax:  [phone]/28/20, 10:35 AM

## 2018-05-07 DIAGNOSIS — E559 Vitamin D deficiency, unspecified: Secondary | ICD-10-CM | POA: Diagnosis not present

## 2018-05-07 DIAGNOSIS — R296 Repeated falls: Secondary | ICD-10-CM | POA: Diagnosis not present

## 2018-05-07 DIAGNOSIS — R569 Unspecified convulsions: Secondary | ICD-10-CM | POA: Diagnosis not present

## 2018-05-07 DIAGNOSIS — R2681 Unsteadiness on feet: Secondary | ICD-10-CM | POA: Diagnosis not present

## 2018-05-07 DIAGNOSIS — E039 Hypothyroidism, unspecified: Secondary | ICD-10-CM | POA: Diagnosis not present

## 2018-05-07 DIAGNOSIS — G3281 Cerebellar ataxia in diseases classified elsewhere: Secondary | ICD-10-CM | POA: Diagnosis not present

## 2018-05-07 DIAGNOSIS — D519 Vitamin B12 deficiency anemia, unspecified: Secondary | ICD-10-CM | POA: Diagnosis not present

## 2018-05-07 DIAGNOSIS — I10 Essential (primary) hypertension: Secondary | ICD-10-CM | POA: Diagnosis not present

## 2018-05-07 DIAGNOSIS — E119 Type 2 diabetes mellitus without complications: Secondary | ICD-10-CM | POA: Diagnosis not present

## 2018-05-07 DIAGNOSIS — R2689 Other abnormalities of gait and mobility: Secondary | ICD-10-CM | POA: Diagnosis not present

## 2018-05-07 DIAGNOSIS — M21372 Foot drop, left foot: Secondary | ICD-10-CM | POA: Diagnosis not present

## 2018-05-07 DIAGNOSIS — D649 Anemia, unspecified: Secondary | ICD-10-CM | POA: Diagnosis not present

## 2018-05-07 DIAGNOSIS — E785 Hyperlipidemia, unspecified: Secondary | ICD-10-CM | POA: Diagnosis not present

## 2018-05-07 DIAGNOSIS — Z79899 Other long term (current) drug therapy: Secondary | ICD-10-CM | POA: Diagnosis not present

## 2018-05-11 DIAGNOSIS — F419 Anxiety disorder, unspecified: Secondary | ICD-10-CM | POA: Diagnosis not present

## 2018-05-11 DIAGNOSIS — E876 Hypokalemia: Secondary | ICD-10-CM | POA: Diagnosis not present

## 2018-05-11 DIAGNOSIS — E785 Hyperlipidemia, unspecified: Secondary | ICD-10-CM | POA: Diagnosis not present

## 2018-05-11 DIAGNOSIS — R52 Pain, unspecified: Secondary | ICD-10-CM | POA: Diagnosis not present

## 2018-05-11 DIAGNOSIS — L853 Xerosis cutis: Secondary | ICD-10-CM | POA: Diagnosis not present

## 2018-05-11 DIAGNOSIS — Z7982 Long term (current) use of aspirin: Secondary | ICD-10-CM | POA: Diagnosis not present

## 2018-05-11 DIAGNOSIS — M25561 Pain in right knee: Secondary | ICD-10-CM | POA: Diagnosis not present

## 2018-05-11 DIAGNOSIS — I10 Essential (primary) hypertension: Secondary | ICD-10-CM | POA: Diagnosis not present

## 2018-05-11 DIAGNOSIS — M6281 Muscle weakness (generalized): Secondary | ICD-10-CM | POA: Diagnosis not present

## 2018-05-11 DIAGNOSIS — M1000 Idiopathic gout, unspecified site: Secondary | ICD-10-CM | POA: Diagnosis not present

## 2018-05-11 DIAGNOSIS — G4700 Insomnia, unspecified: Secondary | ICD-10-CM | POA: Diagnosis not present

## 2018-05-11 DIAGNOSIS — F322 Major depressive disorder, single episode, severe without psychotic features: Secondary | ICD-10-CM | POA: Diagnosis not present

## 2018-05-12 DIAGNOSIS — R569 Unspecified convulsions: Secondary | ICD-10-CM | POA: Diagnosis not present

## 2018-05-12 DIAGNOSIS — E785 Hyperlipidemia, unspecified: Secondary | ICD-10-CM | POA: Diagnosis not present

## 2018-05-12 DIAGNOSIS — R2681 Unsteadiness on feet: Secondary | ICD-10-CM | POA: Diagnosis not present

## 2018-05-12 DIAGNOSIS — R52 Pain, unspecified: Secondary | ICD-10-CM | POA: Diagnosis not present

## 2018-05-12 DIAGNOSIS — R296 Repeated falls: Secondary | ICD-10-CM | POA: Diagnosis not present

## 2018-05-12 DIAGNOSIS — R2689 Other abnormalities of gait and mobility: Secondary | ICD-10-CM | POA: Diagnosis not present

## 2018-05-17 ENCOUNTER — Ambulatory Visit: Payer: Medicare Other | Admitting: Neurology

## 2018-05-18 DIAGNOSIS — F419 Anxiety disorder, unspecified: Secondary | ICD-10-CM | POA: Diagnosis not present

## 2018-05-25 DIAGNOSIS — F331 Major depressive disorder, recurrent, moderate: Secondary | ICD-10-CM | POA: Diagnosis not present

## 2018-05-25 DIAGNOSIS — F064 Anxiety disorder due to known physiological condition: Secondary | ICD-10-CM | POA: Diagnosis not present

## 2018-05-25 DIAGNOSIS — G4701 Insomnia due to medical condition: Secondary | ICD-10-CM | POA: Diagnosis not present

## 2018-05-25 DIAGNOSIS — F1097 Alcohol use, unspecified with alcohol-induced persisting dementia: Secondary | ICD-10-CM | POA: Diagnosis not present

## 2018-05-26 DIAGNOSIS — R569 Unspecified convulsions: Secondary | ICD-10-CM | POA: Diagnosis not present

## 2018-05-26 DIAGNOSIS — Z79899 Other long term (current) drug therapy: Secondary | ICD-10-CM | POA: Diagnosis not present

## 2018-06-08 DIAGNOSIS — G4701 Insomnia due to medical condition: Secondary | ICD-10-CM | POA: Diagnosis not present

## 2018-06-08 DIAGNOSIS — F331 Major depressive disorder, recurrent, moderate: Secondary | ICD-10-CM | POA: Diagnosis not present

## 2018-06-08 DIAGNOSIS — F1097 Alcohol use, unspecified with alcohol-induced persisting dementia: Secondary | ICD-10-CM | POA: Diagnosis not present

## 2018-06-08 DIAGNOSIS — L820 Inflamed seborrheic keratosis: Secondary | ICD-10-CM | POA: Diagnosis not present

## 2018-06-08 DIAGNOSIS — D485 Neoplasm of uncertain behavior of skin: Secondary | ICD-10-CM | POA: Diagnosis not present

## 2018-06-08 DIAGNOSIS — F064 Anxiety disorder due to known physiological condition: Secondary | ICD-10-CM | POA: Diagnosis not present

## 2018-06-10 DIAGNOSIS — Z7409 Other reduced mobility: Secondary | ICD-10-CM | POA: Diagnosis not present

## 2018-06-10 DIAGNOSIS — M25561 Pain in right knee: Secondary | ICD-10-CM | POA: Diagnosis not present

## 2018-06-10 DIAGNOSIS — E785 Hyperlipidemia, unspecified: Secondary | ICD-10-CM | POA: Diagnosis not present

## 2018-06-10 DIAGNOSIS — K069 Disorder of gingiva and edentulous alveolar ridge, unspecified: Secondary | ICD-10-CM | POA: Diagnosis not present

## 2018-06-10 DIAGNOSIS — Z7982 Long term (current) use of aspirin: Secondary | ICD-10-CM | POA: Diagnosis not present

## 2018-06-10 DIAGNOSIS — M6281 Muscle weakness (generalized): Secondary | ICD-10-CM | POA: Diagnosis not present

## 2018-06-10 DIAGNOSIS — Z79899 Other long term (current) drug therapy: Secondary | ICD-10-CM | POA: Diagnosis not present

## 2018-06-10 DIAGNOSIS — H547 Unspecified visual loss: Secondary | ICD-10-CM | POA: Diagnosis not present

## 2018-06-10 DIAGNOSIS — M1000 Idiopathic gout, unspecified site: Secondary | ICD-10-CM | POA: Diagnosis not present

## 2018-06-10 DIAGNOSIS — I10 Essential (primary) hypertension: Secondary | ICD-10-CM | POA: Diagnosis not present

## 2018-06-10 DIAGNOSIS — L853 Xerosis cutis: Secondary | ICD-10-CM | POA: Diagnosis not present

## 2018-06-10 DIAGNOSIS — R52 Pain, unspecified: Secondary | ICD-10-CM | POA: Diagnosis not present

## 2018-06-10 DIAGNOSIS — E876 Hypokalemia: Secondary | ICD-10-CM | POA: Diagnosis not present

## 2018-06-14 DIAGNOSIS — Z7982 Long term (current) use of aspirin: Secondary | ICD-10-CM | POA: Diagnosis not present

## 2018-06-14 DIAGNOSIS — M6281 Muscle weakness (generalized): Secondary | ICD-10-CM | POA: Diagnosis not present

## 2018-06-14 DIAGNOSIS — F419 Anxiety disorder, unspecified: Secondary | ICD-10-CM | POA: Diagnosis not present

## 2018-06-14 DIAGNOSIS — E785 Hyperlipidemia, unspecified: Secondary | ICD-10-CM | POA: Diagnosis not present

## 2018-06-14 DIAGNOSIS — E876 Hypokalemia: Secondary | ICD-10-CM | POA: Diagnosis not present

## 2018-06-14 DIAGNOSIS — F0391 Unspecified dementia with behavioral disturbance: Secondary | ICD-10-CM | POA: Diagnosis not present

## 2018-06-14 DIAGNOSIS — L853 Xerosis cutis: Secondary | ICD-10-CM | POA: Diagnosis not present

## 2018-06-14 DIAGNOSIS — M25561 Pain in right knee: Secondary | ICD-10-CM | POA: Diagnosis not present

## 2018-06-14 DIAGNOSIS — F322 Major depressive disorder, single episode, severe without psychotic features: Secondary | ICD-10-CM | POA: Diagnosis not present

## 2018-06-14 DIAGNOSIS — M1000 Idiopathic gout, unspecified site: Secondary | ICD-10-CM | POA: Diagnosis not present

## 2018-06-14 DIAGNOSIS — R52 Pain, unspecified: Secondary | ICD-10-CM | POA: Diagnosis not present

## 2018-06-14 DIAGNOSIS — I10 Essential (primary) hypertension: Secondary | ICD-10-CM | POA: Diagnosis not present

## 2018-06-15 DIAGNOSIS — R296 Repeated falls: Secondary | ICD-10-CM | POA: Diagnosis not present

## 2018-06-15 DIAGNOSIS — R269 Unspecified abnormalities of gait and mobility: Secondary | ICD-10-CM | POA: Diagnosis not present

## 2018-06-29 DIAGNOSIS — F064 Anxiety disorder due to known physiological condition: Secondary | ICD-10-CM | POA: Diagnosis not present

## 2018-06-29 DIAGNOSIS — F331 Major depressive disorder, recurrent, moderate: Secondary | ICD-10-CM | POA: Diagnosis not present

## 2018-06-29 DIAGNOSIS — F1097 Alcohol use, unspecified with alcohol-induced persisting dementia: Secondary | ICD-10-CM | POA: Diagnosis not present

## 2018-06-29 DIAGNOSIS — G4701 Insomnia due to medical condition: Secondary | ICD-10-CM | POA: Diagnosis not present

## 2018-06-30 DIAGNOSIS — F0150 Vascular dementia without behavioral disturbance: Secondary | ICD-10-CM | POA: Diagnosis not present

## 2018-06-30 DIAGNOSIS — G309 Alzheimer's disease, unspecified: Secondary | ICD-10-CM | POA: Diagnosis not present

## 2018-07-04 DIAGNOSIS — R296 Repeated falls: Secondary | ICD-10-CM | POA: Diagnosis not present

## 2018-07-04 DIAGNOSIS — F0391 Unspecified dementia with behavioral disturbance: Secondary | ICD-10-CM | POA: Diagnosis not present

## 2018-07-06 DIAGNOSIS — R569 Unspecified convulsions: Secondary | ICD-10-CM | POA: Diagnosis not present

## 2018-07-06 DIAGNOSIS — R2689 Other abnormalities of gait and mobility: Secondary | ICD-10-CM | POA: Diagnosis not present

## 2018-07-06 DIAGNOSIS — R296 Repeated falls: Secondary | ICD-10-CM | POA: Diagnosis not present

## 2018-07-06 DIAGNOSIS — R2681 Unsteadiness on feet: Secondary | ICD-10-CM | POA: Diagnosis not present

## 2018-07-11 DIAGNOSIS — R296 Repeated falls: Secondary | ICD-10-CM | POA: Diagnosis not present

## 2018-07-12 DIAGNOSIS — R262 Difficulty in walking, not elsewhere classified: Secondary | ICD-10-CM | POA: Diagnosis not present

## 2018-07-12 DIAGNOSIS — E1129 Type 2 diabetes mellitus with other diabetic kidney complication: Secondary | ICD-10-CM | POA: Diagnosis not present

## 2018-07-12 DIAGNOSIS — M24562 Contracture, left knee: Secondary | ICD-10-CM | POA: Diagnosis not present

## 2018-07-12 DIAGNOSIS — G309 Alzheimer's disease, unspecified: Secondary | ICD-10-CM | POA: Diagnosis not present

## 2018-07-13 DIAGNOSIS — F064 Anxiety disorder due to known physiological condition: Secondary | ICD-10-CM | POA: Diagnosis not present

## 2018-07-13 DIAGNOSIS — F1097 Alcohol use, unspecified with alcohol-induced persisting dementia: Secondary | ICD-10-CM | POA: Diagnosis not present

## 2018-07-13 DIAGNOSIS — R262 Difficulty in walking, not elsewhere classified: Secondary | ICD-10-CM | POA: Diagnosis not present

## 2018-07-13 DIAGNOSIS — F331 Major depressive disorder, recurrent, moderate: Secondary | ICD-10-CM | POA: Diagnosis not present

## 2018-07-13 DIAGNOSIS — G4701 Insomnia due to medical condition: Secondary | ICD-10-CM | POA: Diagnosis not present

## 2018-07-25 DIAGNOSIS — F0391 Unspecified dementia with behavioral disturbance: Secondary | ICD-10-CM | POA: Diagnosis not present

## 2018-07-25 DIAGNOSIS — Z7409 Other reduced mobility: Secondary | ICD-10-CM | POA: Diagnosis not present

## 2018-07-25 DIAGNOSIS — R296 Repeated falls: Secondary | ICD-10-CM | POA: Diagnosis not present

## 2018-07-27 DIAGNOSIS — F331 Major depressive disorder, recurrent, moderate: Secondary | ICD-10-CM | POA: Diagnosis not present

## 2018-07-27 DIAGNOSIS — F064 Anxiety disorder due to known physiological condition: Secondary | ICD-10-CM | POA: Diagnosis not present

## 2018-07-27 DIAGNOSIS — F1097 Alcohol use, unspecified with alcohol-induced persisting dementia: Secondary | ICD-10-CM | POA: Diagnosis not present

## 2018-07-27 DIAGNOSIS — G4701 Insomnia due to medical condition: Secondary | ICD-10-CM | POA: Diagnosis not present

## 2018-08-05 DIAGNOSIS — Z7409 Other reduced mobility: Secondary | ICD-10-CM | POA: Diagnosis not present

## 2018-08-05 DIAGNOSIS — S45311A Laceration of superficial vein at shoulder and upper arm level, right arm, initial encounter: Secondary | ICD-10-CM | POA: Diagnosis not present

## 2018-08-05 DIAGNOSIS — R296 Repeated falls: Secondary | ICD-10-CM | POA: Diagnosis not present

## 2018-08-05 DIAGNOSIS — R2681 Unsteadiness on feet: Secondary | ICD-10-CM | POA: Diagnosis not present

## 2018-08-05 DIAGNOSIS — R569 Unspecified convulsions: Secondary | ICD-10-CM | POA: Diagnosis not present

## 2018-08-05 DIAGNOSIS — R2689 Other abnormalities of gait and mobility: Secondary | ICD-10-CM | POA: Diagnosis not present

## 2018-08-08 DIAGNOSIS — R296 Repeated falls: Secondary | ICD-10-CM | POA: Diagnosis not present

## 2018-08-08 DIAGNOSIS — Z7409 Other reduced mobility: Secondary | ICD-10-CM | POA: Diagnosis not present

## 2018-08-10 DIAGNOSIS — F1097 Alcohol use, unspecified with alcohol-induced persisting dementia: Secondary | ICD-10-CM | POA: Diagnosis not present

## 2018-08-10 DIAGNOSIS — G4701 Insomnia due to medical condition: Secondary | ICD-10-CM | POA: Diagnosis not present

## 2018-08-10 DIAGNOSIS — F0391 Unspecified dementia with behavioral disturbance: Secondary | ICD-10-CM | POA: Diagnosis not present

## 2018-08-10 DIAGNOSIS — Z7409 Other reduced mobility: Secondary | ICD-10-CM | POA: Diagnosis not present

## 2018-08-10 DIAGNOSIS — R296 Repeated falls: Secondary | ICD-10-CM | POA: Diagnosis not present

## 2018-08-10 DIAGNOSIS — F064 Anxiety disorder due to known physiological condition: Secondary | ICD-10-CM | POA: Diagnosis not present

## 2018-08-10 DIAGNOSIS — F331 Major depressive disorder, recurrent, moderate: Secondary | ICD-10-CM | POA: Diagnosis not present

## 2018-08-12 DIAGNOSIS — Z7409 Other reduced mobility: Secondary | ICD-10-CM | POA: Diagnosis not present

## 2018-08-12 DIAGNOSIS — F0391 Unspecified dementia with behavioral disturbance: Secondary | ICD-10-CM | POA: Diagnosis not present

## 2018-08-12 DIAGNOSIS — R296 Repeated falls: Secondary | ICD-10-CM | POA: Diagnosis not present

## 2018-08-15 DIAGNOSIS — G894 Chronic pain syndrome: Secondary | ICD-10-CM | POA: Diagnosis not present

## 2018-08-24 DIAGNOSIS — F0281 Dementia in other diseases classified elsewhere with behavioral disturbance: Secondary | ICD-10-CM | POA: Diagnosis not present

## 2018-08-24 DIAGNOSIS — G301 Alzheimer's disease with late onset: Secondary | ICD-10-CM | POA: Diagnosis not present

## 2018-08-24 DIAGNOSIS — G4701 Insomnia due to medical condition: Secondary | ICD-10-CM | POA: Diagnosis not present

## 2018-08-24 DIAGNOSIS — F062 Psychotic disorder with delusions due to known physiological condition: Secondary | ICD-10-CM | POA: Diagnosis not present

## 2018-08-24 DIAGNOSIS — F1097 Alcohol use, unspecified with alcohol-induced persisting dementia: Secondary | ICD-10-CM | POA: Diagnosis not present

## 2018-08-24 DIAGNOSIS — F331 Major depressive disorder, recurrent, moderate: Secondary | ICD-10-CM | POA: Diagnosis not present

## 2018-08-24 DIAGNOSIS — F064 Anxiety disorder due to known physiological condition: Secondary | ICD-10-CM | POA: Diagnosis not present

## 2018-08-25 DIAGNOSIS — Z79899 Other long term (current) drug therapy: Secondary | ICD-10-CM | POA: Diagnosis not present

## 2018-08-25 DIAGNOSIS — R569 Unspecified convulsions: Secondary | ICD-10-CM | POA: Diagnosis not present

## 2018-09-05 DIAGNOSIS — R2689 Other abnormalities of gait and mobility: Secondary | ICD-10-CM | POA: Diagnosis not present

## 2018-09-05 DIAGNOSIS — R296 Repeated falls: Secondary | ICD-10-CM | POA: Diagnosis not present

## 2018-09-05 DIAGNOSIS — R569 Unspecified convulsions: Secondary | ICD-10-CM | POA: Diagnosis not present

## 2018-09-05 DIAGNOSIS — R2681 Unsteadiness on feet: Secondary | ICD-10-CM | POA: Diagnosis not present

## 2018-09-07 DIAGNOSIS — F1097 Alcohol use, unspecified with alcohol-induced persisting dementia: Secondary | ICD-10-CM | POA: Diagnosis not present

## 2018-09-07 DIAGNOSIS — F0281 Dementia in other diseases classified elsewhere with behavioral disturbance: Secondary | ICD-10-CM | POA: Diagnosis not present

## 2018-09-07 DIAGNOSIS — F064 Anxiety disorder due to known physiological condition: Secondary | ICD-10-CM | POA: Diagnosis not present

## 2018-09-07 DIAGNOSIS — F331 Major depressive disorder, recurrent, moderate: Secondary | ICD-10-CM | POA: Diagnosis not present

## 2018-09-07 DIAGNOSIS — G4701 Insomnia due to medical condition: Secondary | ICD-10-CM | POA: Diagnosis not present

## 2018-09-07 DIAGNOSIS — F062 Psychotic disorder with delusions due to known physiological condition: Secondary | ICD-10-CM | POA: Diagnosis not present

## 2018-09-07 DIAGNOSIS — G301 Alzheimer's disease with late onset: Secondary | ICD-10-CM | POA: Diagnosis not present

## 2018-09-13 DIAGNOSIS — I10 Essential (primary) hypertension: Secondary | ICD-10-CM | POA: Diagnosis not present

## 2018-09-13 DIAGNOSIS — D649 Anemia, unspecified: Secondary | ICD-10-CM | POA: Diagnosis not present

## 2018-09-13 DIAGNOSIS — F322 Major depressive disorder, single episode, severe without psychotic features: Secondary | ICD-10-CM | POA: Diagnosis not present

## 2018-09-13 DIAGNOSIS — R296 Repeated falls: Secondary | ICD-10-CM | POA: Diagnosis not present

## 2018-09-13 DIAGNOSIS — E785 Hyperlipidemia, unspecified: Secondary | ICD-10-CM | POA: Diagnosis not present

## 2018-09-13 DIAGNOSIS — M1000 Idiopathic gout, unspecified site: Secondary | ICD-10-CM | POA: Diagnosis not present

## 2018-09-13 DIAGNOSIS — R54 Age-related physical debility: Secondary | ICD-10-CM | POA: Diagnosis not present

## 2018-09-13 DIAGNOSIS — L853 Xerosis cutis: Secondary | ICD-10-CM | POA: Diagnosis not present

## 2018-09-13 DIAGNOSIS — Z7982 Long term (current) use of aspirin: Secondary | ICD-10-CM | POA: Diagnosis not present

## 2018-09-13 DIAGNOSIS — R52 Pain, unspecified: Secondary | ICD-10-CM | POA: Diagnosis not present

## 2018-09-13 DIAGNOSIS — E876 Hypokalemia: Secondary | ICD-10-CM | POA: Diagnosis not present

## 2018-09-13 DIAGNOSIS — G8929 Other chronic pain: Secondary | ICD-10-CM | POA: Diagnosis not present

## 2018-09-16 DIAGNOSIS — G894 Chronic pain syndrome: Secondary | ICD-10-CM | POA: Diagnosis not present

## 2018-09-21 DIAGNOSIS — G301 Alzheimer's disease with late onset: Secondary | ICD-10-CM | POA: Diagnosis not present

## 2018-09-21 DIAGNOSIS — F064 Anxiety disorder due to known physiological condition: Secondary | ICD-10-CM | POA: Diagnosis not present

## 2018-09-21 DIAGNOSIS — G4701 Insomnia due to medical condition: Secondary | ICD-10-CM | POA: Diagnosis not present

## 2018-09-21 DIAGNOSIS — F331 Major depressive disorder, recurrent, moderate: Secondary | ICD-10-CM | POA: Diagnosis not present

## 2018-09-21 DIAGNOSIS — F0281 Dementia in other diseases classified elsewhere with behavioral disturbance: Secondary | ICD-10-CM | POA: Diagnosis not present

## 2018-09-21 DIAGNOSIS — F1097 Alcohol use, unspecified with alcohol-induced persisting dementia: Secondary | ICD-10-CM | POA: Diagnosis not present

## 2018-09-21 DIAGNOSIS — F062 Psychotic disorder with delusions due to known physiological condition: Secondary | ICD-10-CM | POA: Diagnosis not present

## 2018-09-26 DIAGNOSIS — M6281 Muscle weakness (generalized): Secondary | ICD-10-CM | POA: Diagnosis not present

## 2018-09-26 DIAGNOSIS — R296 Repeated falls: Secondary | ICD-10-CM | POA: Diagnosis not present

## 2018-09-26 DIAGNOSIS — R269 Unspecified abnormalities of gait and mobility: Secondary | ICD-10-CM | POA: Diagnosis not present

## 2018-10-05 DIAGNOSIS — R2689 Other abnormalities of gait and mobility: Secondary | ICD-10-CM | POA: Diagnosis not present

## 2018-10-05 DIAGNOSIS — R296 Repeated falls: Secondary | ICD-10-CM | POA: Diagnosis not present

## 2018-10-05 DIAGNOSIS — R569 Unspecified convulsions: Secondary | ICD-10-CM | POA: Diagnosis not present

## 2018-10-05 DIAGNOSIS — F064 Anxiety disorder due to known physiological condition: Secondary | ICD-10-CM | POA: Diagnosis not present

## 2018-10-05 DIAGNOSIS — R2681 Unsteadiness on feet: Secondary | ICD-10-CM | POA: Diagnosis not present

## 2018-10-10 DIAGNOSIS — F062 Psychotic disorder with delusions due to known physiological condition: Secondary | ICD-10-CM | POA: Diagnosis not present

## 2018-10-10 DIAGNOSIS — F1097 Alcohol use, unspecified with alcohol-induced persisting dementia: Secondary | ICD-10-CM | POA: Diagnosis not present

## 2018-10-10 DIAGNOSIS — F0281 Dementia in other diseases classified elsewhere with behavioral disturbance: Secondary | ICD-10-CM | POA: Diagnosis not present

## 2018-10-10 DIAGNOSIS — R634 Abnormal weight loss: Secondary | ICD-10-CM | POA: Diagnosis not present

## 2018-10-10 DIAGNOSIS — F064 Anxiety disorder due to known physiological condition: Secondary | ICD-10-CM | POA: Diagnosis not present

## 2018-10-10 DIAGNOSIS — F331 Major depressive disorder, recurrent, moderate: Secondary | ICD-10-CM | POA: Diagnosis not present

## 2018-10-10 DIAGNOSIS — E876 Hypokalemia: Secondary | ICD-10-CM | POA: Diagnosis not present

## 2018-10-10 DIAGNOSIS — R296 Repeated falls: Secondary | ICD-10-CM | POA: Diagnosis not present

## 2018-10-10 DIAGNOSIS — E785 Hyperlipidemia, unspecified: Secondary | ICD-10-CM | POA: Diagnosis not present

## 2018-10-10 DIAGNOSIS — M1000 Idiopathic gout, unspecified site: Secondary | ICD-10-CM | POA: Diagnosis not present

## 2018-10-10 DIAGNOSIS — F419 Anxiety disorder, unspecified: Secondary | ICD-10-CM | POA: Diagnosis not present

## 2018-10-10 DIAGNOSIS — G4701 Insomnia due to medical condition: Secondary | ICD-10-CM | POA: Diagnosis not present

## 2018-10-19 DIAGNOSIS — F331 Major depressive disorder, recurrent, moderate: Secondary | ICD-10-CM | POA: Diagnosis not present

## 2018-10-19 DIAGNOSIS — F062 Psychotic disorder with delusions due to known physiological condition: Secondary | ICD-10-CM | POA: Diagnosis not present

## 2018-10-19 DIAGNOSIS — F1097 Alcohol use, unspecified with alcohol-induced persisting dementia: Secondary | ICD-10-CM | POA: Diagnosis not present

## 2018-10-19 DIAGNOSIS — F419 Anxiety disorder, unspecified: Secondary | ICD-10-CM | POA: Diagnosis not present

## 2018-10-19 DIAGNOSIS — G301 Alzheimer's disease with late onset: Secondary | ICD-10-CM | POA: Diagnosis not present

## 2018-10-19 DIAGNOSIS — F0281 Dementia in other diseases classified elsewhere with behavioral disturbance: Secondary | ICD-10-CM | POA: Diagnosis not present

## 2018-10-19 DIAGNOSIS — G4701 Insomnia due to medical condition: Secondary | ICD-10-CM | POA: Diagnosis not present

## 2018-11-02 DIAGNOSIS — F331 Major depressive disorder, recurrent, moderate: Secondary | ICD-10-CM | POA: Diagnosis not present

## 2018-11-02 DIAGNOSIS — F419 Anxiety disorder, unspecified: Secondary | ICD-10-CM | POA: Diagnosis not present

## 2018-11-02 DIAGNOSIS — F1097 Alcohol use, unspecified with alcohol-induced persisting dementia: Secondary | ICD-10-CM | POA: Diagnosis not present

## 2018-11-02 DIAGNOSIS — G4701 Insomnia due to medical condition: Secondary | ICD-10-CM | POA: Diagnosis not present

## 2018-11-02 DIAGNOSIS — F062 Psychotic disorder with delusions due to known physiological condition: Secondary | ICD-10-CM | POA: Diagnosis not present

## 2018-11-02 DIAGNOSIS — G301 Alzheimer's disease with late onset: Secondary | ICD-10-CM | POA: Diagnosis not present

## 2018-11-02 DIAGNOSIS — F0281 Dementia in other diseases classified elsewhere with behavioral disturbance: Secondary | ICD-10-CM | POA: Diagnosis not present

## 2018-11-05 DIAGNOSIS — R2681 Unsteadiness on feet: Secondary | ICD-10-CM | POA: Diagnosis not present

## 2018-11-05 DIAGNOSIS — R296 Repeated falls: Secondary | ICD-10-CM | POA: Diagnosis not present

## 2018-11-05 DIAGNOSIS — R2689 Other abnormalities of gait and mobility: Secondary | ICD-10-CM | POA: Diagnosis not present

## 2018-11-05 DIAGNOSIS — R569 Unspecified convulsions: Secondary | ICD-10-CM | POA: Diagnosis not present

## 2018-11-07 DIAGNOSIS — N489 Disorder of penis, unspecified: Secondary | ICD-10-CM | POA: Diagnosis not present

## 2018-11-07 DIAGNOSIS — B379 Candidiasis, unspecified: Secondary | ICD-10-CM | POA: Diagnosis not present

## 2018-11-08 DIAGNOSIS — D638 Anemia in other chronic diseases classified elsewhere: Secondary | ICD-10-CM | POA: Diagnosis not present

## 2018-11-08 DIAGNOSIS — R54 Age-related physical debility: Secondary | ICD-10-CM | POA: Diagnosis not present

## 2018-11-08 DIAGNOSIS — E559 Vitamin D deficiency, unspecified: Secondary | ICD-10-CM | POA: Diagnosis not present

## 2018-11-08 DIAGNOSIS — F0391 Unspecified dementia with behavioral disturbance: Secondary | ICD-10-CM | POA: Diagnosis not present

## 2018-11-08 DIAGNOSIS — R630 Anorexia: Secondary | ICD-10-CM | POA: Diagnosis not present

## 2018-11-08 DIAGNOSIS — G8929 Other chronic pain: Secondary | ICD-10-CM | POA: Diagnosis not present

## 2018-11-08 DIAGNOSIS — R296 Repeated falls: Secondary | ICD-10-CM | POA: Diagnosis not present

## 2018-11-10 DIAGNOSIS — E119 Type 2 diabetes mellitus without complications: Secondary | ICD-10-CM | POA: Diagnosis not present

## 2018-11-10 DIAGNOSIS — Z79899 Other long term (current) drug therapy: Secondary | ICD-10-CM | POA: Diagnosis not present

## 2018-11-10 DIAGNOSIS — I10 Essential (primary) hypertension: Secondary | ICD-10-CM | POA: Diagnosis not present

## 2018-11-16 DIAGNOSIS — F0281 Dementia in other diseases classified elsewhere with behavioral disturbance: Secondary | ICD-10-CM | POA: Diagnosis not present

## 2018-11-16 DIAGNOSIS — F064 Anxiety disorder due to known physiological condition: Secondary | ICD-10-CM | POA: Diagnosis not present

## 2018-11-16 DIAGNOSIS — F1097 Alcohol use, unspecified with alcohol-induced persisting dementia: Secondary | ICD-10-CM | POA: Diagnosis not present

## 2018-11-16 DIAGNOSIS — G4701 Insomnia due to medical condition: Secondary | ICD-10-CM | POA: Diagnosis not present

## 2018-11-16 DIAGNOSIS — F062 Psychotic disorder with delusions due to known physiological condition: Secondary | ICD-10-CM | POA: Diagnosis not present

## 2018-11-16 DIAGNOSIS — F331 Major depressive disorder, recurrent, moderate: Secondary | ICD-10-CM | POA: Diagnosis not present

## 2018-11-16 DIAGNOSIS — G301 Alzheimer's disease with late onset: Secondary | ICD-10-CM | POA: Diagnosis not present

## 2018-11-22 DIAGNOSIS — Z03818 Encounter for observation for suspected exposure to other biological agents ruled out: Secondary | ICD-10-CM | POA: Diagnosis not present

## 2018-11-25 DIAGNOSIS — F0281 Dementia in other diseases classified elsewhere with behavioral disturbance: Secondary | ICD-10-CM | POA: Diagnosis not present

## 2018-11-25 DIAGNOSIS — R296 Repeated falls: Secondary | ICD-10-CM | POA: Diagnosis not present

## 2018-11-25 DIAGNOSIS — R630 Anorexia: Secondary | ICD-10-CM | POA: Diagnosis not present

## 2018-11-25 DIAGNOSIS — F064 Anxiety disorder due to known physiological condition: Secondary | ICD-10-CM | POA: Diagnosis not present

## 2018-11-25 DIAGNOSIS — G301 Alzheimer's disease with late onset: Secondary | ICD-10-CM | POA: Diagnosis not present

## 2018-11-25 DIAGNOSIS — F1097 Alcohol use, unspecified with alcohol-induced persisting dementia: Secondary | ICD-10-CM | POA: Diagnosis not present

## 2018-11-25 DIAGNOSIS — F331 Major depressive disorder, recurrent, moderate: Secondary | ICD-10-CM | POA: Diagnosis not present

## 2018-11-25 DIAGNOSIS — R54 Age-related physical debility: Secondary | ICD-10-CM | POA: Diagnosis not present

## 2018-11-25 DIAGNOSIS — G8929 Other chronic pain: Secondary | ICD-10-CM | POA: Diagnosis not present

## 2018-11-25 DIAGNOSIS — D638 Anemia in other chronic diseases classified elsewhere: Secondary | ICD-10-CM | POA: Diagnosis not present

## 2018-11-25 DIAGNOSIS — G4701 Insomnia due to medical condition: Secondary | ICD-10-CM | POA: Diagnosis not present

## 2018-11-25 DIAGNOSIS — F062 Psychotic disorder with delusions due to known physiological condition: Secondary | ICD-10-CM | POA: Diagnosis not present

## 2018-11-30 DIAGNOSIS — G301 Alzheimer's disease with late onset: Secondary | ICD-10-CM | POA: Diagnosis not present

## 2018-11-30 DIAGNOSIS — F0281 Dementia in other diseases classified elsewhere with behavioral disturbance: Secondary | ICD-10-CM | POA: Diagnosis not present

## 2018-11-30 DIAGNOSIS — F331 Major depressive disorder, recurrent, moderate: Secondary | ICD-10-CM | POA: Diagnosis not present

## 2018-11-30 DIAGNOSIS — G4701 Insomnia due to medical condition: Secondary | ICD-10-CM | POA: Diagnosis not present

## 2018-11-30 DIAGNOSIS — F064 Anxiety disorder due to known physiological condition: Secondary | ICD-10-CM | POA: Diagnosis not present

## 2018-11-30 DIAGNOSIS — F1097 Alcohol use, unspecified with alcohol-induced persisting dementia: Secondary | ICD-10-CM | POA: Diagnosis not present

## 2018-11-30 DIAGNOSIS — F062 Psychotic disorder with delusions due to known physiological condition: Secondary | ICD-10-CM | POA: Diagnosis not present

## 2018-12-06 DIAGNOSIS — R569 Unspecified convulsions: Secondary | ICD-10-CM | POA: Diagnosis not present

## 2018-12-06 DIAGNOSIS — R296 Repeated falls: Secondary | ICD-10-CM | POA: Diagnosis not present

## 2018-12-06 DIAGNOSIS — R2681 Unsteadiness on feet: Secondary | ICD-10-CM | POA: Diagnosis not present

## 2018-12-06 DIAGNOSIS — R2689 Other abnormalities of gait and mobility: Secondary | ICD-10-CM | POA: Diagnosis not present

## 2018-12-14 DIAGNOSIS — F1097 Alcohol use, unspecified with alcohol-induced persisting dementia: Secondary | ICD-10-CM | POA: Diagnosis not present

## 2018-12-14 DIAGNOSIS — F062 Psychotic disorder with delusions due to known physiological condition: Secondary | ICD-10-CM | POA: Diagnosis not present

## 2018-12-14 DIAGNOSIS — F064 Anxiety disorder due to known physiological condition: Secondary | ICD-10-CM | POA: Diagnosis not present

## 2018-12-14 DIAGNOSIS — G4701 Insomnia due to medical condition: Secondary | ICD-10-CM | POA: Diagnosis not present

## 2018-12-14 DIAGNOSIS — G301 Alzheimer's disease with late onset: Secondary | ICD-10-CM | POA: Diagnosis not present

## 2018-12-14 DIAGNOSIS — F331 Major depressive disorder, recurrent, moderate: Secondary | ICD-10-CM | POA: Diagnosis not present

## 2018-12-14 DIAGNOSIS — F0281 Dementia in other diseases classified elsewhere with behavioral disturbance: Secondary | ICD-10-CM | POA: Diagnosis not present

## 2018-12-15 DIAGNOSIS — R262 Difficulty in walking, not elsewhere classified: Secondary | ICD-10-CM | POA: Diagnosis not present

## 2018-12-15 DIAGNOSIS — E1129 Type 2 diabetes mellitus with other diabetic kidney complication: Secondary | ICD-10-CM | POA: Diagnosis not present

## 2018-12-15 DIAGNOSIS — M24562 Contracture, left knee: Secondary | ICD-10-CM | POA: Diagnosis not present

## 2018-12-19 DIAGNOSIS — F064 Anxiety disorder due to known physiological condition: Secondary | ICD-10-CM | POA: Diagnosis not present

## 2018-12-20 DIAGNOSIS — Z1159 Encounter for screening for other viral diseases: Secondary | ICD-10-CM | POA: Diagnosis not present

## 2018-12-29 DIAGNOSIS — Z79899 Other long term (current) drug therapy: Secondary | ICD-10-CM | POA: Diagnosis not present

## 2018-12-29 DIAGNOSIS — N390 Urinary tract infection, site not specified: Secondary | ICD-10-CM | POA: Diagnosis not present

## 2018-12-29 DIAGNOSIS — R319 Hematuria, unspecified: Secondary | ICD-10-CM | POA: Diagnosis not present

## 2019-01-04 DIAGNOSIS — G8929 Other chronic pain: Secondary | ICD-10-CM | POA: Diagnosis not present

## 2019-01-04 DIAGNOSIS — F064 Anxiety disorder due to known physiological condition: Secondary | ICD-10-CM | POA: Diagnosis not present

## 2019-01-04 DIAGNOSIS — M1000 Idiopathic gout, unspecified site: Secondary | ICD-10-CM | POA: Diagnosis not present

## 2019-01-04 DIAGNOSIS — E785 Hyperlipidemia, unspecified: Secondary | ICD-10-CM | POA: Diagnosis not present

## 2019-01-04 DIAGNOSIS — F419 Anxiety disorder, unspecified: Secondary | ICD-10-CM | POA: Diagnosis not present

## 2019-01-04 DIAGNOSIS — N489 Disorder of penis, unspecified: Secondary | ICD-10-CM | POA: Diagnosis not present

## 2019-01-04 DIAGNOSIS — D638 Anemia in other chronic diseases classified elsewhere: Secondary | ICD-10-CM | POA: Diagnosis not present

## 2019-01-04 DIAGNOSIS — E876 Hypokalemia: Secondary | ICD-10-CM | POA: Diagnosis not present

## 2019-01-04 DIAGNOSIS — R54 Age-related physical debility: Secondary | ICD-10-CM | POA: Diagnosis not present

## 2019-01-04 DIAGNOSIS — R634 Abnormal weight loss: Secondary | ICD-10-CM | POA: Diagnosis not present

## 2019-01-04 DIAGNOSIS — R296 Repeated falls: Secondary | ICD-10-CM | POA: Diagnosis not present

## 2019-01-04 DIAGNOSIS — F0391 Unspecified dementia with behavioral disturbance: Secondary | ICD-10-CM | POA: Diagnosis not present

## 2019-01-04 DIAGNOSIS — E559 Vitamin D deficiency, unspecified: Secondary | ICD-10-CM | POA: Diagnosis not present

## 2019-01-05 DIAGNOSIS — R296 Repeated falls: Secondary | ICD-10-CM | POA: Diagnosis not present

## 2019-01-05 DIAGNOSIS — R569 Unspecified convulsions: Secondary | ICD-10-CM | POA: Diagnosis not present

## 2019-01-05 DIAGNOSIS — R2681 Unsteadiness on feet: Secondary | ICD-10-CM | POA: Diagnosis not present

## 2019-01-05 DIAGNOSIS — R2689 Other abnormalities of gait and mobility: Secondary | ICD-10-CM | POA: Diagnosis not present

## 2019-01-09 DIAGNOSIS — Z03818 Encounter for observation for suspected exposure to other biological agents ruled out: Secondary | ICD-10-CM | POA: Diagnosis not present

## 2019-01-10 DIAGNOSIS — Z03818 Encounter for observation for suspected exposure to other biological agents ruled out: Secondary | ICD-10-CM | POA: Diagnosis not present

## 2019-01-12 DIAGNOSIS — R296 Repeated falls: Secondary | ICD-10-CM | POA: Diagnosis not present

## 2019-01-12 DIAGNOSIS — I10 Essential (primary) hypertension: Secondary | ICD-10-CM | POA: Diagnosis not present

## 2019-01-12 DIAGNOSIS — E559 Vitamin D deficiency, unspecified: Secondary | ICD-10-CM | POA: Diagnosis not present

## 2019-01-12 DIAGNOSIS — E876 Hypokalemia: Secondary | ICD-10-CM | POA: Diagnosis not present

## 2019-01-12 DIAGNOSIS — R54 Age-related physical debility: Secondary | ICD-10-CM | POA: Diagnosis not present

## 2019-01-17 DIAGNOSIS — F0391 Unspecified dementia with behavioral disturbance: Secondary | ICD-10-CM | POA: Diagnosis not present

## 2019-01-17 DIAGNOSIS — R296 Repeated falls: Secondary | ICD-10-CM | POA: Diagnosis not present

## 2019-01-18 DIAGNOSIS — Z03818 Encounter for observation for suspected exposure to other biological agents ruled out: Secondary | ICD-10-CM | POA: Diagnosis not present

## 2019-01-23 DIAGNOSIS — K5903 Drug induced constipation: Secondary | ICD-10-CM | POA: Diagnosis not present

## 2019-01-23 DIAGNOSIS — G8929 Other chronic pain: Secondary | ICD-10-CM | POA: Diagnosis not present

## 2019-01-24 DIAGNOSIS — Z1159 Encounter for screening for other viral diseases: Secondary | ICD-10-CM | POA: Diagnosis not present

## 2019-01-25 DIAGNOSIS — N390 Urinary tract infection, site not specified: Secondary | ICD-10-CM | POA: Diagnosis not present

## 2019-01-25 DIAGNOSIS — E1129 Type 2 diabetes mellitus with other diabetic kidney complication: Secondary | ICD-10-CM | POA: Diagnosis not present

## 2019-01-25 DIAGNOSIS — F0391 Unspecified dementia with behavioral disturbance: Secondary | ICD-10-CM | POA: Diagnosis not present

## 2019-01-25 DIAGNOSIS — R296 Repeated falls: Secondary | ICD-10-CM | POA: Diagnosis not present

## 2019-01-25 DIAGNOSIS — G4700 Insomnia, unspecified: Secondary | ICD-10-CM | POA: Diagnosis not present

## 2019-01-25 DIAGNOSIS — R52 Pain, unspecified: Secondary | ICD-10-CM | POA: Diagnosis not present

## 2019-01-25 DIAGNOSIS — F062 Psychotic disorder with delusions due to known physiological condition: Secondary | ICD-10-CM | POA: Diagnosis not present

## 2019-01-25 DIAGNOSIS — L853 Xerosis cutis: Secondary | ICD-10-CM | POA: Diagnosis not present

## 2019-01-25 DIAGNOSIS — E785 Hyperlipidemia, unspecified: Secondary | ICD-10-CM | POA: Diagnosis not present

## 2019-01-25 DIAGNOSIS — R569 Unspecified convulsions: Secondary | ICD-10-CM | POA: Diagnosis not present

## 2019-01-25 DIAGNOSIS — F1097 Alcohol use, unspecified with alcohol-induced persisting dementia: Secondary | ICD-10-CM | POA: Diagnosis not present

## 2019-01-25 DIAGNOSIS — Z111 Encounter for screening for respiratory tuberculosis: Secondary | ICD-10-CM | POA: Diagnosis not present

## 2019-01-25 DIAGNOSIS — M1000 Idiopathic gout, unspecified site: Secondary | ICD-10-CM | POA: Diagnosis not present

## 2019-01-25 DIAGNOSIS — F419 Anxiety disorder, unspecified: Secondary | ICD-10-CM | POA: Diagnosis not present

## 2019-01-25 DIAGNOSIS — E876 Hypokalemia: Secondary | ICD-10-CM | POA: Diagnosis not present

## 2019-01-25 DIAGNOSIS — E559 Vitamin D deficiency, unspecified: Secondary | ICD-10-CM | POA: Diagnosis not present

## 2019-01-25 DIAGNOSIS — I10 Essential (primary) hypertension: Secondary | ICD-10-CM | POA: Diagnosis not present

## 2019-01-25 DIAGNOSIS — F331 Major depressive disorder, recurrent, moderate: Secondary | ICD-10-CM | POA: Diagnosis not present

## 2019-01-25 DIAGNOSIS — K5900 Constipation, unspecified: Secondary | ICD-10-CM | POA: Diagnosis not present

## 2019-02-01 DIAGNOSIS — G301 Alzheimer's disease with late onset: Secondary | ICD-10-CM | POA: Diagnosis not present

## 2019-02-01 DIAGNOSIS — M25531 Pain in right wrist: Secondary | ICD-10-CM | POA: Diagnosis not present

## 2019-02-01 DIAGNOSIS — G4701 Insomnia due to medical condition: Secondary | ICD-10-CM | POA: Diagnosis not present

## 2019-02-01 DIAGNOSIS — F1097 Alcohol use, unspecified with alcohol-induced persisting dementia: Secondary | ICD-10-CM | POA: Diagnosis not present

## 2019-02-01 DIAGNOSIS — F062 Psychotic disorder with delusions due to known physiological condition: Secondary | ICD-10-CM | POA: Diagnosis not present

## 2019-02-01 DIAGNOSIS — F0281 Dementia in other diseases classified elsewhere with behavioral disturbance: Secondary | ICD-10-CM | POA: Diagnosis not present

## 2019-02-01 DIAGNOSIS — F331 Major depressive disorder, recurrent, moderate: Secondary | ICD-10-CM | POA: Diagnosis not present

## 2019-02-01 DIAGNOSIS — F064 Anxiety disorder due to known physiological condition: Secondary | ICD-10-CM | POA: Diagnosis not present

## 2019-02-03 ENCOUNTER — Emergency Department (HOSPITAL_COMMUNITY): Payer: PPO

## 2019-02-03 ENCOUNTER — Other Ambulatory Visit: Payer: Self-pay

## 2019-02-03 ENCOUNTER — Encounter (HOSPITAL_COMMUNITY): Payer: Self-pay | Admitting: Emergency Medicine

## 2019-02-03 ENCOUNTER — Inpatient Hospital Stay (HOSPITAL_COMMUNITY)
Admission: EM | Admit: 2019-02-03 | Discharge: 2019-02-07 | DRG: 854 | Disposition: A | Discharge status: Skilled Nursing Facility | Payer: PPO | Source: Skilled Nursing Facility | Attending: Internal Medicine | Admitting: Internal Medicine

## 2019-02-03 DIAGNOSIS — R7881 Bacteremia: Secondary | ICD-10-CM | POA: Diagnosis present

## 2019-02-03 DIAGNOSIS — Z87891 Personal history of nicotine dependence: Secondary | ICD-10-CM | POA: Diagnosis not present

## 2019-02-03 DIAGNOSIS — Z981 Arthrodesis status: Secondary | ICD-10-CM | POA: Diagnosis not present

## 2019-02-03 DIAGNOSIS — Z8546 Personal history of malignant neoplasm of prostate: Secondary | ICD-10-CM | POA: Diagnosis not present

## 2019-02-03 DIAGNOSIS — B964 Proteus (mirabilis) (morganii) as the cause of diseases classified elsewhere: Secondary | ICD-10-CM | POA: Diagnosis present

## 2019-02-03 DIAGNOSIS — Z79899 Other long term (current) drug therapy: Secondary | ICD-10-CM | POA: Diagnosis not present

## 2019-02-03 DIAGNOSIS — R4182 Altered mental status, unspecified: Secondary | ICD-10-CM | POA: Diagnosis not present

## 2019-02-03 DIAGNOSIS — Z7982 Long term (current) use of aspirin: Secondary | ICD-10-CM

## 2019-02-03 DIAGNOSIS — G309 Alzheimer's disease, unspecified: Secondary | ICD-10-CM | POA: Diagnosis present

## 2019-02-03 DIAGNOSIS — J449 Chronic obstructive pulmonary disease, unspecified: Secondary | ICD-10-CM | POA: Diagnosis present

## 2019-02-03 DIAGNOSIS — N35914 Unspecified anterior urethral stricture, male: Secondary | ICD-10-CM | POA: Diagnosis present

## 2019-02-03 DIAGNOSIS — E785 Hyperlipidemia, unspecified: Secondary | ICD-10-CM | POA: Diagnosis not present

## 2019-02-03 DIAGNOSIS — M2550 Pain in unspecified joint: Secondary | ICD-10-CM | POA: Diagnosis not present

## 2019-02-03 DIAGNOSIS — F0281 Dementia in other diseases classified elsewhere with behavioral disturbance: Secondary | ICD-10-CM | POA: Diagnosis present

## 2019-02-03 DIAGNOSIS — N390 Urinary tract infection, site not specified: Secondary | ICD-10-CM | POA: Diagnosis not present

## 2019-02-03 DIAGNOSIS — M21372 Foot drop, left foot: Secondary | ICD-10-CM | POA: Diagnosis present

## 2019-02-03 DIAGNOSIS — Z7401 Bed confinement status: Secondary | ICD-10-CM | POA: Diagnosis not present

## 2019-02-03 DIAGNOSIS — Z79818 Long term (current) use of other agents affecting estrogen receptors and estrogen levels: Secondary | ICD-10-CM | POA: Diagnosis not present

## 2019-02-03 DIAGNOSIS — Z03818 Encounter for observation for suspected exposure to other biological agents ruled out: Secondary | ICD-10-CM | POA: Diagnosis not present

## 2019-02-03 DIAGNOSIS — N35919 Unspecified urethral stricture, male, unspecified site: Secondary | ICD-10-CM | POA: Diagnosis not present

## 2019-02-03 DIAGNOSIS — E876 Hypokalemia: Secondary | ICD-10-CM | POA: Diagnosis present

## 2019-02-03 DIAGNOSIS — Z20828 Contact with and (suspected) exposure to other viral communicable diseases: Secondary | ICD-10-CM | POA: Diagnosis present

## 2019-02-03 DIAGNOSIS — A4159 Other Gram-negative sepsis: Principal | ICD-10-CM | POA: Diagnosis present

## 2019-02-03 DIAGNOSIS — K5900 Constipation, unspecified: Secondary | ICD-10-CM | POA: Diagnosis present

## 2019-02-03 DIAGNOSIS — G4730 Sleep apnea, unspecified: Secondary | ICD-10-CM | POA: Diagnosis present

## 2019-02-03 DIAGNOSIS — Z86711 Personal history of pulmonary embolism: Secondary | ICD-10-CM | POA: Diagnosis not present

## 2019-02-03 DIAGNOSIS — F329 Major depressive disorder, single episode, unspecified: Secondary | ICD-10-CM | POA: Diagnosis present

## 2019-02-03 DIAGNOSIS — I10 Essential (primary) hypertension: Secondary | ICD-10-CM | POA: Diagnosis present

## 2019-02-03 DIAGNOSIS — R1084 Generalized abdominal pain: Secondary | ICD-10-CM | POA: Diagnosis not present

## 2019-02-03 DIAGNOSIS — N179 Acute kidney failure, unspecified: Secondary | ICD-10-CM | POA: Diagnosis present

## 2019-02-03 DIAGNOSIS — R404 Transient alteration of awareness: Secondary | ICD-10-CM | POA: Diagnosis not present

## 2019-02-03 DIAGNOSIS — F0280 Dementia in other diseases classified elsewhere without behavioral disturbance: Secondary | ICD-10-CM | POA: Diagnosis present

## 2019-02-03 DIAGNOSIS — M109 Gout, unspecified: Secondary | ICD-10-CM | POA: Diagnosis present

## 2019-02-03 DIAGNOSIS — R52 Pain, unspecified: Secondary | ICD-10-CM | POA: Diagnosis not present

## 2019-02-03 DIAGNOSIS — N132 Hydronephrosis with renal and ureteral calculous obstruction: Secondary | ICD-10-CM | POA: Diagnosis not present

## 2019-02-03 DIAGNOSIS — N136 Pyonephrosis: Secondary | ICD-10-CM | POA: Diagnosis present

## 2019-02-03 DIAGNOSIS — N201 Calculus of ureter: Secondary | ICD-10-CM | POA: Diagnosis not present

## 2019-02-03 DIAGNOSIS — R509 Fever, unspecified: Secondary | ICD-10-CM | POA: Diagnosis not present

## 2019-02-03 DIAGNOSIS — M1000 Idiopathic gout, unspecified site: Secondary | ICD-10-CM | POA: Diagnosis not present

## 2019-02-03 DIAGNOSIS — R531 Weakness: Secondary | ICD-10-CM | POA: Diagnosis not present

## 2019-02-03 DIAGNOSIS — A419 Sepsis, unspecified organism: Secondary | ICD-10-CM

## 2019-02-03 DIAGNOSIS — N200 Calculus of kidney: Secondary | ICD-10-CM

## 2019-02-03 DIAGNOSIS — R269 Unspecified abnormalities of gait and mobility: Secondary | ICD-10-CM

## 2019-02-03 DIAGNOSIS — Z800 Family history of malignant neoplasm of digestive organs: Secondary | ICD-10-CM

## 2019-02-03 DIAGNOSIS — Z66 Do not resuscitate: Secondary | ICD-10-CM | POA: Diagnosis present

## 2019-02-03 DIAGNOSIS — R140 Abdominal distension (gaseous): Secondary | ICD-10-CM | POA: Diagnosis not present

## 2019-02-03 DIAGNOSIS — N35811 Other urethral stricture, male, meatal: Secondary | ICD-10-CM | POA: Diagnosis not present

## 2019-02-03 LAB — COMPREHENSIVE METABOLIC PANEL
ALT: 22 U/L (ref 0–44)
AST: 26 U/L (ref 15–41)
Albumin: 3.1 g/dL — ABNORMAL LOW (ref 3.5–5.0)
Alkaline Phosphatase: 74 U/L (ref 38–126)
Anion gap: 14 (ref 5–15)
BUN: 34 mg/dL — ABNORMAL HIGH (ref 8–23)
CO2: 23 mmol/L (ref 22–32)
Calcium: 8.5 mg/dL — ABNORMAL LOW (ref 8.9–10.3)
Chloride: 105 mmol/L (ref 98–111)
Creatinine, Ser: 1.95 mg/dL — ABNORMAL HIGH (ref 0.61–1.24)
GFR calc Af Amer: 40 mL/min — ABNORMAL LOW (ref >60)
GFR calc non Af Amer: 34 mL/min — ABNORMAL LOW (ref >60)
Glucose, Bld: 196 mg/dL — ABNORMAL HIGH (ref 70–99)
Potassium: 3 mmol/L — ABNORMAL LOW (ref 3.5–5.1)
Sodium: 142 mmol/L (ref 135–145)
Total Bilirubin: 1.1 mg/dL (ref 0.3–1.2)
Total Protein: 7.2 g/dL (ref 6.5–8.1)

## 2019-02-03 LAB — POC SARS CORONAVIRUS 2 AG -  ED: SARS Coronavirus 2 Ag: NEGATIVE

## 2019-02-03 LAB — CBC WITH DIFFERENTIAL/PLATELET
Abs Immature Granulocytes: 0.23 10*3/uL — ABNORMAL HIGH (ref 0.00–0.07)
Basophils Absolute: 0.1 10*3/uL (ref 0.0–0.1)
Basophils Relative: 0 %
Eosinophils Absolute: 0 10*3/uL (ref 0.0–0.5)
Eosinophils Relative: 0 %
HCT: 33.1 % — ABNORMAL LOW (ref 39.0–52.0)
Hemoglobin: 10.5 g/dL — ABNORMAL LOW (ref 13.0–17.0)
Immature Granulocytes: 1 %
Lymphocytes Relative: 3 %
Lymphs Abs: 0.8 10*3/uL (ref 0.7–4.0)
MCH: 29.2 pg (ref 26.0–34.0)
MCHC: 31.7 g/dL (ref 30.0–36.0)
MCV: 92.2 fL (ref 80.0–100.0)
Monocytes Absolute: 2.6 10*3/uL — ABNORMAL HIGH (ref 0.1–1.0)
Monocytes Relative: 11 %
Neutro Abs: 21.2 10*3/uL — ABNORMAL HIGH (ref 1.7–7.7)
Neutrophils Relative %: 85 %
Platelets: 261 10*3/uL (ref 150–400)
RBC: 3.59 MIL/uL — ABNORMAL LOW (ref 4.22–5.81)
RDW: 13.7 % (ref 11.5–15.5)
WBC: 24.8 10*3/uL — ABNORMAL HIGH (ref 4.0–10.5)
nRBC: 0 % (ref 0.0–0.2)

## 2019-02-03 LAB — PROTIME-INR
INR: 1.3 — ABNORMAL HIGH (ref 0.8–1.2)
Prothrombin Time: 16 seconds — ABNORMAL HIGH (ref 11.4–15.2)

## 2019-02-03 LAB — APTT: aPTT: 32 seconds (ref 24–36)

## 2019-02-03 LAB — LACTIC ACID, PLASMA: Lactic Acid, Venous: 1.2 mmol/L (ref 0.5–1.9)

## 2019-02-03 MED ORDER — PIPERACILLIN-TAZOBACTAM 3.375 G IVPB 30 MIN
3.3750 g | Freq: Once | INTRAVENOUS | Status: AC
  Administered 2019-02-03: 23:00:00 3.375 g via INTRAVENOUS
  Filled 2019-02-03: qty 50

## 2019-02-03 NOTE — ED Triage Notes (Signed)
Pt comes to ed via ems, from [facility], c/o increased lethargy, fever, constipation with distention, with recent manual disimpaction and was a given rectal 650mg  Tylenol for fever at facility.  Denies nausea/ vomiting. Fever started at 1300 today according care worker notes. Reports dark urine and hx of UTI. V/s on arrival bp 112/70, hr 75, spo2 95 rm, cbg 240, hx of diabetes and alzheimer/ not at baseline according to care worker, seems overly tired. Recent fall on the 25th, no reported injuries reported.

## 2019-02-03 NOTE — ED Notes (Signed)
Lab called and wants recollect on lactic acid because it was not on ice.

## 2019-02-03 NOTE — ED Provider Notes (Signed)
[facility] DEPT Provider Note   CSN: [id number] Arrival date & time: 02/03/19  1958     History   Chief Complaint Chief Complaint  Patient presents with   Fever   Abdominal Pain    HPI Lucas Vega is a 68 y.o. male.     The history is provided by the EMS personnel and medical records. No language interpreter was used.   Lucas Vega is a 68 y.o. male who presents to the Emergency Department complaining of fever. Level V caveat due to Alzheimer's dementia. He presents the emergency department by EMS from the [facility] home for increased lethargy, fever and abdominal distention. Fever began today around 1 PM. There is report of dark urine per facility.  Past Medical History:  Diagnosis Date   Alzheimer's disease with early onset (CODE) ([place]) 2015   Chronic kidney disease    Convulsions/seizures ([place]) 07/05/2014   last one 1 1/2 years ago as of 12/08/17   COPD (chronic obstructive pulmonary disease) ([facility])    Depression    Gait abnormality 10/28/2017   Gout    History of kidney stones    History of pulmonary embolism    after surgery ? 2004   Hypertension    Left foot drop 10/28/2017   Memory disturbance 01/26/2013   Pneumonia    as a child   Pre-diabetes    took Metformin for a while, but after weight loss did not have take it anymore   Prostate cancer ([place])    RLS (restless legs syndrome)    Sleep apnea    "mild case" - tried cpap but he couldn't use it.    Patient Active Problem List   Diagnosis Date Noted   Myelopathy ([place]) 12/13/2017   Gait abnormality 10/28/2017   Left foot drop 10/28/2017   Convulsions/seizures ([place]) 07/05/2014   Right shoulder pain 07/05/2014   Memory disturbance 01/26/2013    Past Surgical History:  Procedure Laterality Date   ANTERIOR CERVICAL DECOMP/DISCECTOMY FUSION N/A 12/13/2017   Procedure: ANTERIOR CERVICAL DECOMPRESSION/DISCECTOMY FUSION CERVICAL THREE-CERVICAL  FOUR;  Surgeon: Kary Kos, MD;  Location: [facility];  Service: Neurosurgery;  Laterality: N/A;  ANTERIOR CERVICAL DECOMPRESSION/DISCECTOMY FUSION CERVICAL THREE-CERVICAL FOUR   COLONOSCOPY  2009   JAN   PROSTATECTOMY     TONSILLECTOMY AND ADENOIDECTOMY          Home Medications    Prior to Admission medications   Medication Sig Start Date End Date Taking? Authorizing Provider  allopurinol (ZYLOPRIM) 100 MG tablet Take 100 mg by mouth daily.    [provider]  ALPRAZolam Duanne Moron) 0.25 MG tablet Take 0.25 mg by mouth 2 (two) times daily.     [provider]  aspirin 81 MG tablet Take 81 mg by mouth daily.    [provider]  atenolol-chlorthalidone (TENORETIC) 50-25 MG per tablet Take 1 tablet by mouth at bedtime.     [provider]  buPROPion (WELLBUTRIN) 75 MG tablet Take 75 mg by mouth daily.    [provider]  cyclobenzaprine (FLEXERIL) 10 MG tablet Take 1 tablet (10 mg total) by mouth 3 (three) times daily as needed for muscle spasms. 12/14/17   Meyran, Ocie Cornfield, NP  divalproex (DEPAKOTE) 250 MG DR tablet Take 250 mg by mouth 2 (two) times daily. 01/20/18   [provider]  divalproex (DEPAKOTE) 500 MG DR tablet TAKE 1 TABLET BY MOUTH TWICE DAILY Patient taking differently: Take 500 mg by mouth 2 (  two) times daily.  09/01/17   Kathrynn Ducking, MD  HYDROcodone-acetaminophen (NORCO/VICODIN) 5-325 MG tablet Take 1 tablet by mouth every 6 (six) hours as needed for severe pain. 01/28/18   Drenda Freeze, MD  hydrOXYzine (ATARAX/VISTARIL) 25 MG tablet Take 1 tablet (25 mg total) by mouth 3 (three) times daily as needed. For agitation 01/22/18   Sater, Nanine Means, MD  KLOR-CON M20 20 MEQ tablet Take 20 mEq by mouth 2 (two) times daily.  02/18/16   [provider]  megestrol (MEGACE) 40 MG tablet Take 40 mg by mouth daily.     [provider]  memantine (NAMENDA) 10 MG tablet Take 1 tablet (10 mg total) by mouth  2 (two) times daily. 08/29/14   Ward Givens, NP  simvastatin (ZOCOR) 20 MG tablet Take 20 mg by mouth every evening.     [provider]  venlafaxine XR (EFFEXOR-XR) 150 MG 24 hr capsule Take 150 mg by mouth daily. 01/20/18   [provider]  venlafaxine XR (EFFEXOR-XR) 75 MG 24 hr capsule TAKE 1 CAPSULE BY MOUTH ONCE DAILY WITH BREAKFAST Patient taking differently: Take 75 mg by mouth daily with breakfast.  10/28/17   Kathrynn Ducking, MD    Family History Family History  Problem Relation Age of Onset   Dementia Mother    Pancreatic cancer Father    Seizures Neg Hx     Social History Social History   Tobacco Use   Smoking status: Former Smoker    Quit date: 09/29/2012    Years since quitting: 6.3   Smokeless tobacco: Never Used  Substance Use Topics   Alcohol use: Yes    Comment: occasional   Drug use: No     Allergies   Patient has no known allergies.   Review of Systems Review of Systems  Unable to perform ROS: Dementia     Physical Exam Updated Vital Signs BP 124/69    Pulse 88    Temp (!) 101.9 F (38.8 C) (Rectal)    Resp (!) 21    SpO2 95%   Physical Exam Vitals signs and nursing note reviewed.  Constitutional:      Appearance: He is well-developed.  HENT:     Head: Normocephalic and atraumatic.  Cardiovascular:     Rate and Rhythm: Normal rate and regular rhythm.     Heart sounds: No murmur.  Pulmonary:     Effort: Pulmonary effort is normal. No respiratory distress.     Breath sounds: Normal breath sounds.  Abdominal:     General: There is distension.     Tenderness: There is abdominal tenderness. There is no guarding or rebound.     Comments: Abdominal distention with diffuse abdominal tenderness  Musculoskeletal:        General: No tenderness.  Skin:    General: Skin is warm and dry.  Neurological:     Mental Status: He is alert.     Comments: Nonverbal, awake and alert. Does not follow commands. Left facial  droop.  Psychiatric:     Comments: Unable to assess      ED Treatments / Results  Labs (all labs ordered are listed, but only abnormal results are displayed) Labs Reviewed  COMPREHENSIVE METABOLIC PANEL - Abnormal; Notable for the following components:      Result Value   Potassium 3.0 (*)    Glucose, Bld 196 (*)    BUN 34 (*)    Creatinine, Ser 1.95 (*)  Calcium 8.5 (*)    Albumin 3.1 (*)    GFR calc non Af Amer 34 (*)    GFR calc Af Amer 40 (*)    All other components within normal limits  CBC WITH DIFFERENTIAL/PLATELET - Abnormal; Notable for the following components:   WBC 24.8 (*)    RBC 3.59 (*)    Hemoglobin 10.5 (*)    HCT 33.1 (*)    Neutro Abs 21.2 (*)    Monocytes Absolute 2.6 (*)    Abs Immature Granulocytes 0.23 (*)    All other components within normal limits  PROTIME-INR - Abnormal; Notable for the following components:   Prothrombin Time 16.0 (*)    INR 1.3 (*)    All other components within normal limits  URINALYSIS, ROUTINE W REFLEX MICROSCOPIC - Abnormal; Notable for the following components:   APPearance CLOUDY (*)    Hgb urine dipstick SMALL (*)    Protein, ur 100 (*)    Leukocytes,Ua LARGE (*)    WBC, UA >50 (*)    Bacteria, UA RARE (*)    All other components within normal limits  CULTURE, BLOOD (ROUTINE X 2)  CULTURE, BLOOD (ROUTINE X 2)  URINE CULTURE  LACTIC ACID, PLASMA  APTT  LACTIC ACID, PLASMA  POC SARS CORONAVIRUS 2 AG -  ED    EKG EKG Interpretation  Date/Time:  Friday February 03 2019 20:15:23 EST Ventricular Rate:  83 PR Interval:    QRS Duration: 92 QT Interval:  419 QTC Calculation: 453 R Axis:   78 Text Interpretation: Sinus rhythm Interpretation limited secondary to artifact Confirmed by Quintella Reichert [phone]) on 02/03/2019 9:16:53 PM   Radiology Ct Abdomen Pelvis Wo Contrast  Result Date: 02/04/2019 CLINICAL DATA:  Abdominal pain, fever, abscess suspected EXAM: CT ABDOMEN AND PELVIS WITHOUT CONTRAST  TECHNIQUE: Multidetector CT imaging of the abdomen and pelvis was performed following the standard protocol without IV contrast. COMPARISON:  None. FINDINGS: Lower chest: Lung bases are clear. Normal heart size. No pericardial effusion. Hepatobiliary: No focal liver abnormality is seen. No gallstones, gallbladder wall thickening, or biliary dilatation. Pancreas: Diffuse atrophy. No pancreatic ductal dilatation or surrounding inflammatory changes. Spleen: Normal in size without focal abnormality. Splenic vascular calcifications of the hilum. Adrenals/Urinary Tract: Normal adrenal glands. No visible or contour deforming renal lesions. Asymmetric enlargement of the right kidney with mild hydronephrosis and a elongated calculus measuring up to 6 mm in maximal diameter in the UPJ. There is asymmetrically increased right perinephric stranding. Mild distal right ureterectasis is noted as well with some faint periureteral stranding as well. Nonobstructing calculus present in the lower pole left kidney. No left hydronephrosis. No bladder calculi. Urinary bladder is largely decompressed at the time of exam and therefore poorly evaluated by CT imaging. Stomach/Bowel: Distal esophagus, stomach and duodenal sweep are unremarkable. No small bowel wall thickening or dilatation. No evidence of obstruction. A normal appendix is visualized. No colonic dilatation or wall thickening. Scattered colonic diverticula without focal pericolonic inflammation to suggest diverticulitis. Fecal material in the gluteal cleft. Vascular/Lymphatic: Extensive atherosclerotic calcification of the aorta and branch vessels. Some reactive retroperitoneal adenopathy. No pathologically enlarged nodes seen in the abdomen or pelvis. Reproductive: The prostate and seminal vesicles are unremarkable. Other: Mild body wall edema. Soft tissue stranding of the lateral hips. Postsurgical changes in the right inguinal region, correlate for prior coronal hernia  repair. Musculoskeletal: No acute osseous abnormality or suspicious osseous lesion. Multilevel degenerative changes are present in the imaged portions of  the spine. Additional degenerative changes in hips and pelvis. IMPRESSION: 1. Obstructing right UPJ calculus measuring up to 6 mm in maximal diameter. Mild resulting hydronephrosis. 2. More distal right ureterectasis and periureteral stranding could reflect a an additional recently passed calculus or potential ascending urinary tract infection. Correlate with urinalysis. 3. Additional nonobstructing calculus in the lower pole of the left kidney. 4. Colonic diverticulosis without evidence of diverticulitis. 5. Surgical clips in the right inguinal region, correlate for history of inguinal hernia repair. 6. Aortic Atherosclerosis (ICD10-I70.0). Electronically Signed   By: Lovena Le M.D.   On: 02/04/2019 01:02   Dg Chest Port 1 View  Result Date: 02/03/2019 CLINICAL DATA:  Lethargy and fever. EXAM: PORTABLE CHEST 1 VIEW COMPARISON:  December 29, 2017 FINDINGS: The heart size and mediastinal contours are within normal limits. Both lungs are clear. The visualized skeletal structures are unremarkable. IMPRESSION: No active disease. Electronically Signed   By: Dorise Bullion III M.D   On: 02/03/2019 21:33    Procedures Procedures (including critical care time) CRITICAL CARE Performed by: Quintella Reichert   Total critical care time: 40 minutes  Critical care time was exclusive of separately billable procedures and treating other patients.  Critical care was necessary to treat or prevent imminent or life-threatening deterioration.  Critical care was time spent personally by me on the following activities: development of treatment plan with patient and/or surrogate as well as nursing, discussions with consultants, evaluation of patient's response to treatment, examination of patient, obtaining history from patient or surrogate, ordering and performing  treatments and interventions, ordering and review of laboratory studies, ordering and review of radiographic studies, pulse oximetry and re-evaluation of patient's condition.  Medications Ordered in ED Medications  piperacillin-tazobactam (ZOSYN) IVPB 3.375 g (0 g Intravenous Stopped 02/04/19 0036)     Initial Impression / Assessment and Plan / ED Course  I have reviewed the triage vital signs and the nursing notes.  Pertinent labs & imaging results that were available during my care of the patient were reviewed by me and considered in my medical decision making (see chart for details).       Patient here for evaluation abdominal pain, fever. Attempted to obtain additional history from facility, but there was no answer. CBC with leukocytosis, BMP with mild AKI. CT scan demonstrates a UPJ stone and urinalysis is concerning for UTI. He was treated with broad-spectrum antibiotics pending workup due to concern for infection. Discussed with Dr. Gloriann Loan, with urology, who will evaluate the patient in consult. Hospitalist consulted for admission.  Discussed the case with patient's wife, Algernon Coccaro, of patient's condition and need for admission for further treatment.  [phone]   Final Clinical Impressions(s) / ED Diagnoses   Final diagnoses:  None    ED Discharge Orders    None       Quintella Reichert, MD 02/04/19 [phone]

## 2019-02-04 ENCOUNTER — Encounter (HOSPITAL_COMMUNITY): Payer: Self-pay | Admitting: *Deleted

## 2019-02-04 ENCOUNTER — Other Ambulatory Visit: Payer: Self-pay

## 2019-02-04 ENCOUNTER — Emergency Department (HOSPITAL_COMMUNITY): Payer: PPO

## 2019-02-04 ENCOUNTER — Inpatient Hospital Stay (HOSPITAL_COMMUNITY): Payer: PPO | Admitting: Certified Registered Nurse Anesthetist

## 2019-02-04 ENCOUNTER — Inpatient Hospital Stay (HOSPITAL_COMMUNITY): Payer: PPO

## 2019-02-04 ENCOUNTER — Encounter (HOSPITAL_COMMUNITY)
Admission: EM | Disposition: A | Discharge status: Skilled Nursing Facility | Payer: Self-pay | Source: Skilled Nursing Facility | Attending: Internal Medicine

## 2019-02-04 DIAGNOSIS — Z7982 Long term (current) use of aspirin: Secondary | ICD-10-CM | POA: Diagnosis not present

## 2019-02-04 DIAGNOSIS — N390 Urinary tract infection, site not specified: Secondary | ICD-10-CM | POA: Diagnosis present

## 2019-02-04 DIAGNOSIS — E876 Hypokalemia: Secondary | ICD-10-CM | POA: Diagnosis present

## 2019-02-04 DIAGNOSIS — Z86711 Personal history of pulmonary embolism: Secondary | ICD-10-CM | POA: Diagnosis not present

## 2019-02-04 DIAGNOSIS — N35914 Unspecified anterior urethral stricture, male: Secondary | ICD-10-CM | POA: Diagnosis present

## 2019-02-04 DIAGNOSIS — K5900 Constipation, unspecified: Secondary | ICD-10-CM | POA: Diagnosis present

## 2019-02-04 DIAGNOSIS — Z20828 Contact with and (suspected) exposure to other viral communicable diseases: Secondary | ICD-10-CM | POA: Diagnosis present

## 2019-02-04 DIAGNOSIS — N179 Acute kidney failure, unspecified: Secondary | ICD-10-CM | POA: Diagnosis present

## 2019-02-04 DIAGNOSIS — N35919 Unspecified urethral stricture, male, unspecified site: Secondary | ICD-10-CM | POA: Diagnosis not present

## 2019-02-04 DIAGNOSIS — Z79899 Other long term (current) drug therapy: Secondary | ICD-10-CM | POA: Diagnosis not present

## 2019-02-04 DIAGNOSIS — Z87891 Personal history of nicotine dependence: Secondary | ICD-10-CM | POA: Diagnosis not present

## 2019-02-04 DIAGNOSIS — A419 Sepsis, unspecified organism: Secondary | ICD-10-CM | POA: Diagnosis present

## 2019-02-04 DIAGNOSIS — G4730 Sleep apnea, unspecified: Secondary | ICD-10-CM | POA: Diagnosis present

## 2019-02-04 DIAGNOSIS — M109 Gout, unspecified: Secondary | ICD-10-CM | POA: Diagnosis present

## 2019-02-04 DIAGNOSIS — Z981 Arthrodesis status: Secondary | ICD-10-CM | POA: Diagnosis not present

## 2019-02-04 DIAGNOSIS — F0280 Dementia in other diseases classified elsewhere without behavioral disturbance: Secondary | ICD-10-CM | POA: Diagnosis present

## 2019-02-04 DIAGNOSIS — I10 Essential (primary) hypertension: Secondary | ICD-10-CM | POA: Diagnosis present

## 2019-02-04 DIAGNOSIS — Z8546 Personal history of malignant neoplasm of prostate: Secondary | ICD-10-CM | POA: Diagnosis not present

## 2019-02-04 DIAGNOSIS — F0281 Dementia in other diseases classified elsewhere with behavioral disturbance: Secondary | ICD-10-CM | POA: Diagnosis present

## 2019-02-04 DIAGNOSIS — N200 Calculus of kidney: Secondary | ICD-10-CM

## 2019-02-04 DIAGNOSIS — A4159 Other Gram-negative sepsis: Secondary | ICD-10-CM | POA: Diagnosis present

## 2019-02-04 DIAGNOSIS — G309 Alzheimer's disease, unspecified: Secondary | ICD-10-CM | POA: Diagnosis present

## 2019-02-04 DIAGNOSIS — F329 Major depressive disorder, single episode, unspecified: Secondary | ICD-10-CM | POA: Diagnosis present

## 2019-02-04 DIAGNOSIS — N136 Pyonephrosis: Secondary | ICD-10-CM | POA: Diagnosis present

## 2019-02-04 DIAGNOSIS — B964 Proteus (mirabilis) (morganii) as the cause of diseases classified elsewhere: Secondary | ICD-10-CM | POA: Diagnosis present

## 2019-02-04 DIAGNOSIS — M21372 Foot drop, left foot: Secondary | ICD-10-CM | POA: Diagnosis present

## 2019-02-04 DIAGNOSIS — Z79818 Long term (current) use of other agents affecting estrogen receptors and estrogen levels: Secondary | ICD-10-CM | POA: Diagnosis not present

## 2019-02-04 DIAGNOSIS — N201 Calculus of ureter: Secondary | ICD-10-CM | POA: Diagnosis not present

## 2019-02-04 DIAGNOSIS — Z66 Do not resuscitate: Secondary | ICD-10-CM | POA: Diagnosis present

## 2019-02-04 DIAGNOSIS — J449 Chronic obstructive pulmonary disease, unspecified: Secondary | ICD-10-CM | POA: Diagnosis present

## 2019-02-04 DIAGNOSIS — R7881 Bacteremia: Secondary | ICD-10-CM | POA: Diagnosis not present

## 2019-02-04 HISTORY — PX: CYSTOSCOPY WITH RETROGRADE PYELOGRAM, URETEROSCOPY AND STENT PLACEMENT: SHX5789

## 2019-02-04 LAB — BLOOD CULTURE ID PANEL (REFLEXED)

## 2019-02-04 LAB — COMPREHENSIVE METABOLIC PANEL
ALT: 22 U/L (ref 0–44)
AST: 24 U/L (ref 15–41)
Albumin: 3.2 g/dL — ABNORMAL LOW (ref 3.5–5.0)
Alkaline Phosphatase: 70 U/L (ref 38–126)
Anion gap: 13 (ref 5–15)
BUN: 34 mg/dL — ABNORMAL HIGH (ref 8–23)
CO2: 24 mmol/L (ref 22–32)
Calcium: 9 mg/dL (ref 8.9–10.3)
Chloride: 107 mmol/L (ref 98–111)
Creatinine, Ser: 2.14 mg/dL — ABNORMAL HIGH (ref 0.61–1.24)
GFR calc Af Amer: 35 mL/min — ABNORMAL LOW (ref >60)
GFR calc non Af Amer: 30 mL/min — ABNORMAL LOW (ref >60)
Glucose, Bld: 223 mg/dL — ABNORMAL HIGH (ref 70–99)
Potassium: 3 mmol/L — ABNORMAL LOW (ref 3.5–5.1)
Sodium: 144 mmol/L (ref 135–145)
Total Bilirubin: 1.5 mg/dL — ABNORMAL HIGH (ref 0.3–1.2)
Total Protein: 7.4 g/dL (ref 6.5–8.1)

## 2019-02-04 LAB — URINALYSIS, ROUTINE W REFLEX MICROSCOPIC
Bilirubin Urine: NEGATIVE
Glucose, UA: NEGATIVE mg/dL
Ketones, ur: NEGATIVE mg/dL
Nitrite: NEGATIVE
Protein, ur: 100 mg/dL — AB
Specific Gravity, Urine: 1.019 (ref 1.005–1.030)
WBC, UA: >50 WBC/hpf — ABNORMAL HIGH (ref 0–5)
pH: 6 (ref 5.0–8.0)

## 2019-02-04 LAB — CBC
HCT: 34.1 % — ABNORMAL LOW (ref 39.0–52.0)
Hemoglobin: 10.7 g/dL — ABNORMAL LOW (ref 13.0–17.0)
MCH: 30 pg (ref 26.0–34.0)
MCHC: 31.4 g/dL (ref 30.0–36.0)
MCV: 95.5 fL (ref 80.0–100.0)
Platelets: 234 10*3/uL (ref 150–400)
RBC: 3.57 MIL/uL — ABNORMAL LOW (ref 4.22–5.81)
RDW: 13.8 % (ref 11.5–15.5)
WBC: 21.5 10*3/uL — ABNORMAL HIGH (ref 4.0–10.5)
nRBC: 0 % (ref 0.0–0.2)

## 2019-02-04 LAB — SURGICAL PCR SCREEN
MRSA, PCR: NEGATIVE
Staphylococcus aureus: NEGATIVE

## 2019-02-04 LAB — LACTIC ACID, PLASMA: Lactic Acid, Venous: 2.5 mmol/L (ref 0.5–1.9)

## 2019-02-04 LAB — SARS CORONAVIRUS 2 BY RT PCR (HOSPITAL ORDER, PERFORMED IN ~~LOC~~ HOSPITAL LAB): SARS Coronavirus 2: NEGATIVE

## 2019-02-04 LAB — HIV ANTIBODY (ROUTINE TESTING W REFLEX): HIV Screen 4th Generation wRfx: NONREACTIVE

## 2019-02-04 SURGERY — CYSTOURETEROSCOPY, WITH RETROGRADE PYELOGRAM AND STENT INSERTION
Anesthesia: General | Site: Ureter | Laterality: Right

## 2019-02-04 MED ORDER — DEXAMETHASONE SODIUM PHOSPHATE 4 MG/ML IJ SOLN
INTRAMUSCULAR | Status: DC | PRN
  Administered 2019-02-04: 4 mg via INTRAVENOUS

## 2019-02-04 MED ORDER — DIVALPROEX SODIUM 250 MG PO DR TAB
250.0000 mg | DELAYED_RELEASE_TABLET | Freq: Two times a day (BID) | ORAL | Status: DC
  Administered 2019-02-04 – 2019-02-07 (×6): 250 mg via ORAL
  Filled 2019-02-04 (×6): qty 1

## 2019-02-04 MED ORDER — LIDOCAINE 2% (20 MG/ML) 5 ML SYRINGE
INTRAMUSCULAR | Status: DC | PRN
  Administered 2019-02-04: 100 mg via INTRAVENOUS

## 2019-02-04 MED ORDER — SERTRALINE HCL 100 MG PO TABS
100.0000 mg | ORAL_TABLET | Freq: Every day | ORAL | Status: DC
  Administered 2019-02-04 – 2019-02-07 (×4): 100 mg via ORAL
  Filled 2019-02-04 (×4): qty 1

## 2019-02-04 MED ORDER — HEPARIN SODIUM (PORCINE) 5000 UNIT/ML IJ SOLN
5000.0000 [IU] | Freq: Three times a day (TID) | INTRAMUSCULAR | Status: DC
  Administered 2019-02-04 – 2019-02-07 (×8): 5000 [IU] via SUBCUTANEOUS
  Filled 2019-02-04 (×10): qty 1

## 2019-02-04 MED ORDER — MORPHINE SULFATE (PF) 2 MG/ML IV SOLN: 1.0000 mg | INTRAVENOUS | Status: DC | PRN

## 2019-02-04 MED ORDER — ASPIRIN 81 MG PO CHEW
81.0000 mg | CHEWABLE_TABLET | Freq: Every day | ORAL | Status: DC
  Administered 2019-02-04: 81 mg via ORAL
  Filled 2019-02-04: qty 1

## 2019-02-04 MED ORDER — ONDANSETRON HCL 4 MG/2ML IJ SOLN
INTRAMUSCULAR | Status: AC
  Filled 2019-02-04: qty 2

## 2019-02-04 MED ORDER — LIDOCAINE 2% (20 MG/ML) 5 ML SYRINGE
INTRAMUSCULAR | Status: AC
  Filled 2019-02-04: qty 5

## 2019-02-04 MED ORDER — ATORVASTATIN CALCIUM 10 MG PO TABS
10.0000 mg | ORAL_TABLET | Freq: Every day | ORAL | Status: DC
  Administered 2019-02-04 – 2019-02-06 (×3): 10 mg via ORAL
  Filled 2019-02-04 (×4): qty 1

## 2019-02-04 MED ORDER — ACETAMINOPHEN 325 MG PO TABS: 650.0000 mg | ORAL_TABLET | Freq: Four times a day (QID) | ORAL | Status: DC | PRN

## 2019-02-04 MED ORDER — VITAMIN D 25 MCG (1000 UNIT) PO TABS
2000.0000 [IU] | ORAL_TABLET | Freq: Every day | ORAL | Status: DC
  Administered 2019-02-04 – 2019-02-07 (×4): 2000 [IU] via ORAL
  Filled 2019-02-04 (×5): qty 2

## 2019-02-04 MED ORDER — ALPRAZOLAM 0.25 MG PO TABS
0.2500 mg | ORAL_TABLET | Freq: Three times a day (TID) | ORAL | Status: DC | PRN
  Administered 2019-02-06 – 2019-02-07 (×3): 0.25 mg via ORAL
  Filled 2019-02-04 (×3): qty 1

## 2019-02-04 MED ORDER — ASPIRIN 81 MG PO CHEW
81.0000 mg | CHEWABLE_TABLET | Freq: Every day | ORAL | Status: DC
  Administered 2019-02-05 – 2019-02-07 (×3): 81 mg via ORAL
  Filled 2019-02-04 (×3): qty 1

## 2019-02-04 MED ORDER — HYDROXYZINE HCL 25 MG PO TABS: 25.0000 mg | ORAL_TABLET | Freq: Three times a day (TID) | ORAL | Status: DC | PRN

## 2019-02-04 MED ORDER — SODIUM CHLORIDE 0.9 % IV SOLN
INTRAVENOUS | Status: DC
  Administered 2019-02-04 (×3): via INTRAVENOUS

## 2019-02-04 MED ORDER — SODIUM CHLORIDE 0.9 % IR SOLN
Status: DC | PRN
  Administered 2019-02-04: 3000 mL

## 2019-02-04 MED ORDER — MEMANTINE HCL 10 MG PO TABS
10.0000 mg | ORAL_TABLET | Freq: Two times a day (BID) | ORAL | Status: DC
  Administered 2019-02-04 – 2019-02-07 (×6): 10 mg via ORAL
  Filled 2019-02-04 (×6): qty 1

## 2019-02-04 MED ORDER — FENTANYL CITRATE (PF) 100 MCG/2ML IJ SOLN
INTRAMUSCULAR | Status: DC | PRN
  Administered 2019-02-04 (×2): 25 ug via INTRAVENOUS

## 2019-02-04 MED ORDER — VENLAFAXINE HCL ER 150 MG PO CP24: 150.0000 mg | ORAL_CAPSULE | Freq: Every day | ORAL | Status: DC

## 2019-02-04 MED ORDER — VENLAFAXINE HCL ER 75 MG PO CP24: 75.0000 mg | ORAL_CAPSULE | Freq: Every day | ORAL | Status: DC

## 2019-02-04 MED ORDER — RISPERIDONE 0.5 MG PO TABS
0.5000 mg | ORAL_TABLET | Freq: Two times a day (BID) | ORAL | Status: DC
  Administered 2019-02-04 – 2019-02-07 (×7): 0.5 mg via ORAL
  Filled 2019-02-04 (×7): qty 1

## 2019-02-04 MED ORDER — ATENOLOL-CHLORTHALIDONE 50-25 MG PO TABS: 0.5000 | ORAL_TABLET | Freq: Every day | ORAL | Status: DC

## 2019-02-04 MED ORDER — PROPOFOL 10 MG/ML IV BOLUS
INTRAVENOUS | Status: AC
  Filled 2019-02-04: qty 20

## 2019-02-04 MED ORDER — CHLORTHALIDONE 25 MG PO TABS
12.5000 mg | ORAL_TABLET | Freq: Every day | ORAL | Status: DC
  Administered 2019-02-06: 12.5 mg via ORAL
  Filled 2019-02-04 (×4): qty 0.5

## 2019-02-04 MED ORDER — ONDANSETRON HCL 4 MG PO TABS: 4.0000 mg | ORAL_TABLET | Freq: Four times a day (QID) | ORAL | Status: DC | PRN

## 2019-02-04 MED ORDER — RISAQUAD PO CAPS
1.0000 | ORAL_CAPSULE | Freq: Every day | ORAL | Status: DC
  Administered 2019-02-04 – 2019-02-07 (×4): 1 via ORAL
  Filled 2019-02-04 (×4): qty 1

## 2019-02-04 MED ORDER — IOHEXOL 300 MG/ML  SOLN
INTRAMUSCULAR | Status: DC | PRN
  Administered 2019-02-04: 50 mL

## 2019-02-04 MED ORDER — MEMANTINE HCL 10 MG PO TABS
10.0000 mg | ORAL_TABLET | Freq: Two times a day (BID) | ORAL | Status: DC
  Administered 2019-02-04: 10 mg via ORAL
  Filled 2019-02-04: qty 1

## 2019-02-04 MED ORDER — ONDANSETRON HCL 4 MG/2ML IJ SOLN
4.0000 mg | Freq: Four times a day (QID) | INTRAMUSCULAR | Status: DC | PRN
  Administered 2019-02-04: 08:00:00 4 mg via INTRAVENOUS

## 2019-02-04 MED ORDER — PROPOFOL 10 MG/ML IV BOLUS
INTRAVENOUS | Status: DC | PRN
  Administered 2019-02-04: 90 mg via INTRAVENOUS

## 2019-02-04 MED ORDER — PHENYLEPHRINE 40 MCG/ML (10ML) SYRINGE FOR IV PUSH (FOR BLOOD PRESSURE SUPPORT)
PREFILLED_SYRINGE | INTRAVENOUS | Status: DC | PRN
  Administered 2019-02-04: 120 ug via INTRAVENOUS
  Administered 2019-02-04: 80 ug via INTRAVENOUS
  Administered 2019-02-04: 120 ug via INTRAVENOUS

## 2019-02-04 MED ORDER — CHLORHEXIDINE GLUCONATE CLOTH 2 % EX PADS
6.0000 | MEDICATED_PAD | Freq: Every day | CUTANEOUS | Status: DC
  Administered 2019-02-04 – 2019-02-07 (×4): 6 via TOPICAL

## 2019-02-04 MED ORDER — BUPROPION HCL 75 MG PO TABS: 75.0000 mg | ORAL_TABLET | Freq: Every day | ORAL | Status: DC

## 2019-02-04 MED ORDER — ACETAMINOPHEN 650 MG RE SUPP
650.0000 mg | Freq: Four times a day (QID) | RECTAL | Status: DC | PRN
  Administered 2019-02-04 (×2): 650 mg via RECTAL
  Filled 2019-02-04 (×2): qty 1

## 2019-02-04 MED ORDER — MELATONIN 3 MG PO TABS
3.0000 mg | ORAL_TABLET | Freq: Every day | ORAL | Status: DC
  Administered 2019-02-04 – 2019-02-06 (×3): 3 mg via ORAL
  Filled 2019-02-04 (×3): qty 1

## 2019-02-04 MED ORDER — FENTANYL CITRATE (PF) 100 MCG/2ML IJ SOLN
INTRAMUSCULAR | Status: AC
  Filled 2019-02-04: qty 2

## 2019-02-04 MED ORDER — SODIUM CHLORIDE 0.9 % IV SOLN
2.0000 g | Freq: Every day | INTRAVENOUS | Status: AC
  Administered 2019-02-04 – 2019-02-07 (×4): 2 g via INTRAVENOUS
  Filled 2019-02-04 (×4): qty 2

## 2019-02-04 MED ORDER — POLYETHYLENE GLYCOL 3350 17 G PO PACK
17.0000 g | PACK | Freq: Every day | ORAL | Status: DC
  Administered 2019-02-05 – 2019-02-06 (×2): 17 g via ORAL
  Filled 2019-02-04 (×2): qty 1

## 2019-02-04 MED ORDER — MEGESTROL ACETATE 40 MG PO TABS
40.0000 mg | ORAL_TABLET | Freq: Every day | ORAL | Status: DC
  Administered 2019-02-04: 40 mg via ORAL
  Filled 2019-02-04: qty 1

## 2019-02-04 MED ORDER — PROMETHAZINE HCL 25 MG/ML IJ SOLN: 6.2500 mg | INTRAMUSCULAR | Status: DC | PRN

## 2019-02-04 MED ORDER — SODIUM CHLORIDE 0.9 % IV SOLN
1.0000 g | Freq: Every day | INTRAVENOUS | Status: DC
  Administered 2019-02-04: 1 g via INTRAVENOUS
  Filled 2019-02-04: qty 1
  Filled 2019-02-04: qty 10

## 2019-02-04 MED ORDER — DEXAMETHASONE SODIUM PHOSPHATE 10 MG/ML IJ SOLN
INTRAMUSCULAR | Status: AC
  Filled 2019-02-04: qty 1

## 2019-02-04 MED ORDER — FENTANYL CITRATE (PF) 100 MCG/2ML IJ SOLN: 25.0000 ug | INTRAMUSCULAR | Status: DC | PRN

## 2019-02-04 MED ORDER — ATENOLOL 25 MG PO TABS
25.0000 mg | ORAL_TABLET | Freq: Every day | ORAL | Status: DC
  Administered 2019-02-06: 25 mg via ORAL
  Filled 2019-02-04 (×2): qty 1

## 2019-02-04 SURGICAL SUPPLY — 16 items
BAG URO CATCHER STRL LF (MISCELLANEOUS) ×4 IMPLANT
CATH FOLEY 2W COUNCIL 5CC 18FR (CATHETERS) ×4 IMPLANT
CATH INTERMIT  6FR 70CM (CATHETERS) ×4 IMPLANT
CLOTH BEACON ORANGE TIMEOUT ST (SAFETY) ×4 IMPLANT
GLOVE BIO SURGEON STRL SZ7.5 (GLOVE) ×4 IMPLANT
GOWN STRL REUS W/TWL LRG LVL3 (GOWN DISPOSABLE) ×8 IMPLANT
GOWN STRL REUS W/TWL XL LVL3 (GOWN DISPOSABLE) ×4 IMPLANT
GUIDEWIRE STR DUAL SENSOR (WIRE) ×4 IMPLANT
KIT TURNOVER KIT A (KITS) IMPLANT
MANIFOLD NEPTUNE II (INSTRUMENTS) ×4 IMPLANT
PACK CYSTO (CUSTOM PROCEDURE TRAY) ×4 IMPLANT
STENT URET 6FRX26 CONTOUR (STENTS) ×4 IMPLANT
TRAY FOLEY MTR SLVR 16FR STAT (SET/KITS/TRAYS/PACK) ×4 IMPLANT
TUBING CONNECTING 10 (TUBING) ×3 IMPLANT
TUBING CONNECTING 10' (TUBING) ×1
TUBING UROLOGY SET (TUBING) IMPLANT

## 2019-02-04 NOTE — Anesthesia Postprocedure Evaluation (Signed)
Anesthesia Post Note  Patient: Lucas Vega  Procedure(s) Performed: CYSTOSCOPY WITH RETROGRADE PYELOGRAM,  AND STENT PLACEMENT urethral dilation (Right Ureter)     Patient location during evaluation: PACU Anesthesia Type: General Level of consciousness: awake and alert Pain management: pain level controlled Vital Signs Assessment: post-procedure vital signs reviewed and stable Respiratory status: spontaneous breathing, nonlabored ventilation, respiratory function stable and patient connected to nasal cannula oxygen Cardiovascular status: blood pressure returned to baseline and stable Postop Assessment: no apparent nausea or vomiting Anesthetic complications: no    Last Vitals:  Vitals:   02/04/19 0815 02/04/19 0830  BP: 130/73 120/70  Pulse: 87   Resp: 18   Temp:    SpO2: 100%     Last Pain:  Vitals:   02/04/19 0558  TempSrc: Oral  PainSc:                  Charlett Merkle S

## 2019-02-04 NOTE — Progress Notes (Signed)
Patient with very advanced dementia, currently long-term nursing home resident admitted early morning hours by nighttime hospitalist where patient was found more lethargic and febrile.  In the emergency room, he was febrile and had a UTI.  A CT scan showed obstructive right UPJ stone.  Patient underwent cystoscopy, ureteroscopy and stenting.  On antibiotics. Patient seen with patient's wife at the bedside after procedure.  Patient has very advanced dementia with behavior problems.  His wife is seeing him after 8 months due to pandemic.  Plan: Sepsis due to UTI present on admission, improving.  Continue Rocephin until clinical improvement in pending final cultures. Severe dementia with behavioral disturbances: Sometimes may have difficulty controlling symptoms.  Patient is on multiple SSRI, SNRI, antipsychotics and benzos.  Symptomatic treatment.  Resume home medications. DNR/DNI.  Has documentation in the chart, wife confirms. Anticipate discharge back to long-term care once clinically stable.

## 2019-02-04 NOTE — Consult Note (Signed)
H&P Physician requesting consult: Quintella Reichert  Chief Complaint: Right ureteral calculus, concern for sepsis  History of Present Illness: 68 year old male with Alzheimer's dementia currently residing in a care facility was noted to have a fever today.  He was also noted to be constipated and was disimpacted.  He was given rectal Tylenol without improvement of fever.  Therefore given this in combination with abdominal pain he was sent to the emergency department.  He was found to have a white blood cell count of 24 with febrile to 102.  Creatinine elevated at 1.95 with leukocytosis of 25.  His vital signs are stable.  He does have nonobstructing calculi on the left as well.  Past Medical History:  Diagnosis Date  . Alzheimer's disease with early onset (CODE) ([place]) 2015  . Chronic kidney disease   . Convulsions/seizures ([place]) 07/05/2014   last one 1 1/2 years ago as of 12/08/17  . COPD (chronic obstructive pulmonary disease) ([place])   . Depression   . Gait abnormality 10/28/2017  . Gout   . History of kidney stones   . History of pulmonary embolism    after surgery ? 2004  . Hypertension   . Left foot drop 10/28/2017  . Memory disturbance 01/26/2013  . Pneumonia    as a child  . Pre-diabetes    took Metformin for a while, but after weight loss did not have take it anymore  . Prostate cancer ([place])   . RLS (restless legs syndrome)   . Sleep apnea    "mild case" - tried cpap but he couldn't use it.   Past Surgical History:  Procedure Laterality Date  . ANTERIOR CERVICAL DECOMP/DISCECTOMY FUSION N/A 12/13/2017   Procedure: ANTERIOR CERVICAL DECOMPRESSION/DISCECTOMY FUSION CERVICAL THREE-CERVICAL FOUR;  Surgeon: Kary Kos, MD;  Location: [facility];  Service: Neurosurgery;  Laterality: N/A;  ANTERIOR CERVICAL DECOMPRESSION/DISCECTOMY FUSION CERVICAL THREE-CERVICAL FOUR  . COLONOSCOPY  2009   JAN  . PROSTATECTOMY    . TONSILLECTOMY AND ADENOIDECTOMY      Home Medications:  (Not in a  hospital admission)  Allergies: No Known Allergies  Family History  Problem Relation Age of Onset  . Dementia Mother   . Pancreatic cancer Father   . Seizures Neg Hx    Social History:  reports that he quit smoking about 6 years ago. He has never used smokeless tobacco. He reports current alcohol use. He reports that he does not use drugs.  ROS: A complete review of systems was performed.  All systems are negative except for pertinent findings as noted. ROS   Physical Exam:  Vital signs in last 24 hours: Temp:  [101.9 F (38.8 C)] 101.9 F (38.8 C) (11/27 2018) Pulse Rate:  [71-88] 88 (11/27 2330) Resp:  [18-21] 19 (11/28 0130) BP: (110-151)/(68-87) 122/70 (11/28 0130) SpO2:  [93 %-100 %] 95 % (11/27 2330) General:  No acute distress HEENT: Normocephalic, atraumatic Neck: No JVD or lymphadenopathy Cardiovascular: Regular rate and rhythm Lungs: Regular rate and effort Abdomen: Soft, nontender, nondistended, no abdominal masses Back: No CVA tenderness Extremities: No edema Neurologic: Grossly intact  Laboratory Data:  Results for orders placed or performed during the hospital encounter of 02/03/19 (from the past 24 hour(s))  Lactic acid, plasma     Status: None   Collection Time: 02/03/19 10:28 PM  Result Value Ref Range   Lactic Acid, Venous 1.2 0.5 - 1.9 mmol/L  Comprehensive metabolic panel     Status: Abnormal   Collection Time: 02/03/19 10:28  PM  Result Value Ref Range   Sodium 142 135 - 145 mmol/L   Potassium 3.0 (L) 3.5 - 5.1 mmol/L   Chloride 105 98 - 111 mmol/L   CO2 23 22 - 32 mmol/L   Glucose, Bld 196 (H) 70 - 99 mg/dL   BUN 34 (H) 8 - 23 mg/dL   Creatinine, Ser 1.95 (H) 0.61 - 1.24 mg/dL   Calcium 8.5 (L) 8.9 - 10.3 mg/dL   Total Protein 7.2 6.5 - 8.1 g/dL   Albumin 3.1 (L) 3.5 - 5.0 g/dL   AST 26 15 - 41 U/L   ALT 22 0 - 44 U/L   Alkaline Phosphatase 74 38 - 126 U/L   Total Bilirubin 1.1 0.3 - 1.2 mg/dL   GFR calc non Af Amer 34 (L) >60 mL/min    GFR calc Af Amer 40 (L) >60 mL/min   Anion gap 14 5 - 15  CBC WITH DIFFERENTIAL     Status: Abnormal   Collection Time: 02/03/19 10:28 PM  Result Value Ref Range   WBC 24.8 (H) 4.0 - 10.5 K/uL   RBC 3.59 (L) 4.22 - 5.81 MIL/uL   Hemoglobin 10.5 (L) 13.0 - 17.0 g/dL   HCT 33.1 (L) 39.0 - 52.0 %   MCV 92.2 80.0 - 100.0 fL   MCH 29.2 26.0 - 34.0 pg   MCHC 31.7 30.0 - 36.0 g/dL   RDW 13.7 11.5 - 15.5 %   Platelets 261 150 - 400 K/uL   nRBC 0.0 0.0 - 0.2 %   Neutrophils Relative % 85 %   Neutro Abs 21.2 (H) 1.7 - 7.7 K/uL   Lymphocytes Relative 3 %   Lymphs Abs 0.8 0.7 - 4.0 K/uL   Monocytes Relative 11 %   Monocytes Absolute 2.6 (H) 0.1 - 1.0 K/uL   Eosinophils Relative 0 %   Eosinophils Absolute 0.0 0.0 - 0.5 K/uL   Basophils Relative 0 %   Basophils Absolute 0.1 0.0 - 0.1 K/uL   Immature Granulocytes 1 %   Abs Immature Granulocytes 0.23 (H) 0.00 - 0.07 K/uL  APTT     Status: None   Collection Time: 02/03/19 10:28 PM  Result Value Ref Range   aPTT 32 24 - 36 seconds  Protime-INR     Status: Abnormal   Collection Time: 02/03/19 10:28 PM  Result Value Ref Range   Prothrombin Time 16.0 (H) 11.4 - 15.2 seconds   INR 1.3 (H) 0.8 - 1.2  POC SARS Coronavirus 2 Ag-ED - Nasal Swab (BD Veritor Kit)     Status: None   Collection Time: 02/03/19 11:51 PM  Result Value Ref Range   SARS Coronavirus 2 Ag NEGATIVE NEGATIVE  Urinalysis, Routine w reflex microscopic     Status: Abnormal   Collection Time: 02/04/19 12:30 AM  Result Value Ref Range   Color, Urine YELLOW YELLOW   APPearance CLOUDY (A) CLEAR   Specific Gravity, Urine 1.019 1.005 - 1.030   pH 6.0 5.0 - 8.0   Glucose, UA NEGATIVE NEGATIVE mg/dL   Hgb urine dipstick SMALL (A) NEGATIVE   Bilirubin Urine NEGATIVE NEGATIVE   Ketones, ur NEGATIVE NEGATIVE mg/dL   Protein, ur 100 (A) NEGATIVE mg/dL   Nitrite NEGATIVE NEGATIVE   Leukocytes,Ua LARGE (A) NEGATIVE   RBC / HPF 6-10 0 - 5 RBC/hpf   WBC, UA >50 (H) 0 - 5 WBC/hpf    Bacteria, UA RARE (A) NONE SEEN   WBC Clumps PRESENT      No results found for this or any previous visit (from the past 240 hour(s)). Creatinine: Recent Labs    02/03/19 2228  CREATININE 1.95*   CT personally reviewed and is detailed in history of present illness  Impression/Assessment:  Right ureteropelvic junction calculus Sepsis secondary to urinary tract infection  Plan:  Plan to proceed with urgent right ureteral stent placement.  Risks and benefits discussed with his wife.  Of note, nursing was unable to get a hold of the patient's wife just prior to surgery.  I did fully discuss everything with her around 2 AM this morning.  She was in full agreement with surgery under general anesthesia and understood the risk and benefits.  Marton Redwood, III 02/04/2019, 2:04 AM

## 2019-02-04 NOTE — Anesthesia Preprocedure Evaluation (Signed)
Anesthesia Evaluation  Patient identified by MRN, date of birth, ID band Patient confused    Reviewed: Allergy & Precautions, NPO status , Patient's Chart, lab work & pertinent test results, Unable to perform ROS - Chart review only  Airway Mallampati: II  TM Distance: >3 FB Neck ROM: Full    Dental no notable dental hx.    Pulmonary sleep apnea , COPD, former smoker,    Pulmonary exam normal breath sounds clear to auscultation       Cardiovascular hypertension, Normal cardiovascular exam Rhythm:Regular Rate:Normal     Neuro/Psych Dementia negative neurological ROS     GI/Hepatic negative GI ROS, Neg liver ROS,   Endo/Other  negative endocrine ROS  Renal/GU negative Renal ROS  negative genitourinary   Musculoskeletal negative musculoskeletal ROS (+)   Abdominal   Peds negative pediatric ROS (+)  Hematology negative hematology ROS (+)   Anesthesia Other Findings   Reproductive/Obstetrics negative OB ROS                             Anesthesia Physical Anesthesia Plan  ASA: IV  Anesthesia Plan: General   Post-op Pain Management:    Induction: Intravenous  PONV Risk Score and Plan: 2 and Ondansetron, Dexamethasone and Treatment may vary due to age or medical condition  Airway Management Planned: LMA  Additional Equipment:   Intra-op Plan:   Post-operative Plan: Extubation in OR  Informed Consent: I have reviewed the patients History and Physical, chart, labs and discussed the procedure including the risks, benefits and alternatives for the proposed anesthesia with the patient or authorized representative who has indicated his/her understanding and acceptance.     Dental advisory given  Plan Discussed with: CRNA and Surgeon  Anesthesia Plan Comments:         Anesthesia Quick Evaluation

## 2019-02-04 NOTE — Plan of Care (Signed)
  Problem: Education: Goal: Knowledge of General Education information will improve Description: Including pain rating scale, medication(s)/side effects and non-pharmacologic comfort measures Outcome: Progressing   Problem: Activity: Goal: Risk for activity intolerance will decrease Outcome: Progressing   

## 2019-02-04 NOTE — Progress Notes (Signed)
PHARMACY - PHYSICIAN COMMUNICATION CRITICAL VALUE ALERT - BLOOD CULTURE IDENTIFICATION (BCID)  Lucas Vega is an 68 y.o. male who presented to [facility] on 02/03/2019 with a chief complaint of fever & weakness; treating currently for UTI.  Assessment:  Proteus bacteremia (suspected urinary source)  Name of physician (or Provider) Contacted: n/a  Current antibiotics: Rocephin 1g IV q24  Changes to prescribed antibiotics recommended: Increase Rocephin to 2g daily Recommendations accepted by provider  Results for orders placed or performed during the hospital encounter of 02/03/19  Blood Culture ID Panel (Reflexed) (Collected: 02/03/2019 10:29 PM)  Result Value Ref Range   Enterococcus species NOT DETECTED NOT DETECTED   Listeria monocytogenes NOT DETECTED NOT DETECTED   Staphylococcus species NOT DETECTED NOT DETECTED   Staphylococcus aureus (BCID) NOT DETECTED NOT DETECTED   Streptococcus species NOT DETECTED NOT DETECTED   Streptococcus agalactiae NOT DETECTED NOT DETECTED   Streptococcus pneumoniae NOT DETECTED NOT DETECTED   Streptococcus pyogenes NOT DETECTED NOT DETECTED   Acinetobacter baumannii NOT DETECTED NOT DETECTED   Enterobacteriaceae species DETECTED (A) NOT DETECTED   Enterobacter cloacae complex NOT DETECTED NOT DETECTED   Escherichia coli NOT DETECTED NOT DETECTED   Klebsiella oxytoca NOT DETECTED NOT DETECTED   Klebsiella pneumoniae NOT DETECTED NOT DETECTED   Proteus species DETECTED (A) NOT DETECTED   Serratia marcescens NOT DETECTED NOT DETECTED   Carbapenem resistance NOT DETECTED NOT DETECTED   Haemophilus influenzae NOT DETECTED NOT DETECTED   Neisseria meningitidis NOT DETECTED NOT DETECTED   Pseudomonas aeruginosa NOT DETECTED NOT DETECTED   Candida albicans NOT DETECTED NOT DETECTED   Candida glabrata NOT DETECTED NOT DETECTED   Candida krusei NOT DETECTED NOT DETECTED   Candida parapsilosis NOT DETECTED NOT DETECTED   Candida tropicalis NOT  DETECTED NOT DETECTED    Narda Fundora A 02/04/2019  7:22 PM

## 2019-02-04 NOTE — ED Notes (Signed)
ED TO INPATIENT HANDOFF REPORT  ED Nurse Name and Phone #: jon wled   S Name/Age/Gender Lucas Vega 68 y.o. male Room/Bed: WA08/WA08  Code Status   Code Status: Prior  Home/SNF/Other Skilled nursing facility {Patient oriented altered with a baseline of alzheimer  Is this baseline? No   Triage Complete: Triage complete  Chief Complaint Fever and Abd. Pain  Triage Note Pt comes to ed via ems, from [facility], c/o increased lethargy, fever, constipation with distention, with recent manual disimpaction and was a given rectal 650mg Tylenol for fever at facility.  Denies nausea/ vomiting. Fever started at 1300 today according care worker notes. Reports dark urine and hx of UTI. V/s on arrival bp 112/70, hr 75, spo2 95 rm, cbg 240, hx of diabetes and alzheimer/ not at baseline according to care worker, seems overly tired. Recent fall on the 25th, no reported injuries reported.        Allergies No Known Allergies  Level of Care/Admitting Diagnosis ED Disposition    ED Disposition Condition Comment   Admit  Hospital Area: [facility] [[id number]]  Level of Care: Telemetry [5]  Admit to tele based on following criteria: Other see comments  Comments: Sepsis  Covid Evaluation: Asymptomatic Screening Protocol (No Symptoms)  Diagnosis: Sepsis (HCC) [[id number]]  Admitting Physician: GARBA, MOHAMMAD L [[id number]]  Attending Physician: GARBA, MOHAMMAD L [[id number]]  Estimated length of stay: past midnight tomorrow  Certification:: I certify this patient will need inpatient services for at least 2 midnights  PT Class (Do Not Modify): Inpatient [101]  PT Acc Code (Do Not Modify): Private [1]       B Medical/Surgery History Past Medical History:  Diagnosis Date  . Alzheimer's disease with early onset (CODE) (HCC) 2015  . Chronic kidney disease   . Convulsions/seizures (HCC) 07/05/2014   last one 1 1/2 years ago as of 12/08/17  . COPD (chronic obstructive pulmonary  disease) (HCC)   . Depression   . Gait abnormality 10/28/2017  . Gout   . History of kidney stones   . History of pulmonary embolism    after surgery ? 2004  . Hypertension   . Left foot drop 10/28/2017  . Memory disturbance 01/26/2013  . Pneumonia    as a child  . Pre-diabetes    took Metformin for a while, but after weight loss did not have take it anymore  . Prostate cancer (HCC)   . RLS (restless legs syndrome)   . Sleep apnea    "mild case" - tried cpap but he couldn't use it.   Past Surgical History:  Procedure Laterality Date  . ANTERIOR CERVICAL DECOMP/DISCECTOMY FUSION N/A 12/13/2017   Procedure: ANTERIOR CERVICAL DECOMPRESSION/DISCECTOMY FUSION CERVICAL THREE-CERVICAL FOUR;  Surgeon: Cram, Gary, MD;  Location: [facility] OR;  Service: Neurosurgery;  Laterality: N/A;  ANTERIOR CERVICAL DECOMPRESSION/DISCECTOMY FUSION CERVICAL THREE-CERVICAL FOUR  . COLONOSCOPY  2009   JAN  . PROSTATECTOMY    . TONSILLECTOMY AND ADENOIDECTOMY       A IV Location/Drains/Wounds Patient Lines/Drains/Airways Status   Active Line/Drains/Airways    Name:   Placement date:   Placement time:   Site:   Days:   Peripheral IV 02/03/19 Right Arm   02/03/19    2315    Arm   1          Intake/Output Last 24 hours No intake or output data in the 24 hours ending 02/04/19 0217  Labs/Imaging Results for orders placed or performed during   the hospital encounter of 02/03/19 (from the past 48 hour(s))  Lactic acid, plasma     Status: None   Collection Time: 02/03/19 10:28 PM  Result Value Ref Range   Lactic Acid, Venous 1.2 0.5 - 1.9 mmol/L    Comment: Performed at [facility], [address] [place], [place] [address]  Comprehensive metabolic panel     Status: Abnormal   Collection Time: 02/03/19 10:28 PM  Result Value Ref Range   Sodium 142 135 - 145 mmol/L   Potassium 3.0 (L) 3.5 - 5.1 mmol/L   Chloride 105 98 - 111 mmol/L   CO2 23 22 - 32 mmol/L   Glucose, Bld 196 (H) 70 - 99  mg/dL   BUN 34 (H) 8 - 23 mg/dL   Creatinine, Ser 1.95 (H) 0.61 - 1.24 mg/dL   Calcium 8.5 (L) 8.9 - 10.3 mg/dL   Total Protein 7.2 6.5 - 8.1 g/dL   Albumin 3.1 (L) 3.5 - 5.0 g/dL   AST 26 15 - 41 U/L   ALT 22 0 - 44 U/L   Alkaline Phosphatase 74 38 - 126 U/L   Total Bilirubin 1.1 0.3 - 1.2 mg/dL   GFR calc non Af Amer 34 (L) >60 mL/min   GFR calc Af Amer 40 (L) >60 mL/min   Anion gap 14 5 - 15    Comment: Performed at [facility], [address] [place], [place] [address]  CBC WITH DIFFERENTIAL     Status: Abnormal   Collection Time: 02/03/19 10:28 PM  Result Value Ref Range   WBC 24.8 (H) 4.0 - 10.5 K/uL   RBC 3.59 (L) 4.22 - 5.81 MIL/uL   Hemoglobin 10.5 (L) 13.0 - 17.0 g/dL   HCT 33.1 (L) 39.0 - 52.0 %   MCV 92.2 80.0 - 100.0 fL   MCH 29.2 26.0 - 34.0 pg   MCHC 31.7 30.0 - 36.0 g/dL   RDW 13.7 11.5 - 15.5 %   Platelets 261 150 - 400 K/uL   nRBC 0.0 0.0 - 0.2 %   Neutrophils Relative % 85 %   Neutro Abs 21.2 (H) 1.7 - 7.7 K/uL   Lymphocytes Relative 3 %   Lymphs Abs 0.8 0.7 - 4.0 K/uL   Monocytes Relative 11 %   Monocytes Absolute 2.6 (H) 0.1 - 1.0 K/uL   Eosinophils Relative 0 %   Eosinophils Absolute 0.0 0.0 - 0.5 K/uL   Basophils Relative 0 %   Basophils Absolute 0.1 0.0 - 0.1 K/uL   Immature Granulocytes 1 %   Abs Immature Granulocytes 0.23 (H) 0.00 - 0.07 K/uL    Comment: Performed at [facility], [address] [place], [place] [address]  APTT     Status: None   Collection Time: 02/03/19 10:28 PM  Result Value Ref Range   aPTT 32 24 - 36 seconds    Comment: Performed at [facility], [address] [place], [place] [address]  Protime-INR     Status: Abnormal   Collection Time: 02/03/19 10:28 PM  Result Value Ref Range   Prothrombin Time 16.0 (H) 11.4 - 15.2 seconds   INR 1.3 (H) 0.8 - 1.2    Comment: (NOTE) INR goal varies based on device and disease states. Performed at [facility], [address] [place], [place] [address]   POC SARS Coronavirus 2 Ag-ED - Nasal Swab (BD Veritor Kit)     Status: None     Collection Time: 02/03/19 11:51 PM  Result Value Ref Range   SARS Coronavirus 2 Ag NEGATIVE NEGATIVE    Comment: (NOTE) SARS-CoV-2 antigen NOT DETECTED.  Negative results are presumptive.  Negative results do not preclude SARS-CoV-2 infection and should not be used as the sole basis for treatment or other patient management decisions, including infection  control decisions, particularly in the presence of clinical signs and  symptoms consistent with COVID-19, or in those who have been in contact with the virus.  Negative results must be combined with clinical observations, patient history, and epidemiological information. The expected result is Negative. Fact Sheet for Patients: https://www.fda.gov/media/139754/download Fact Sheet for Healthcare Providers: https://www.fda.gov/media/139753/download This test is not yet approved or cleared by the [place] FDA and  has been authorized for detection and/or diagnosis of SARS-CoV-2 by FDA under an Emergency Use Authorization (EUA).  This EUA will remain in effect (meaning this test can be used) for the duration of  the COVID-19 de claration under Section 564(b)(1) of the Act, 21 U.S.C. section 360bbb-3(b)(1), unless the authorization is terminated or revoked sooner.   Urinalysis, Routine w reflex microscopic     Status: Abnormal   Collection Time: 02/04/19 12:30 AM  Result Value Ref Range   Color, Urine YELLOW YELLOW   APPearance CLOUDY (A) CLEAR   Specific Gravity, Urine 1.019 1.005 - 1.030   pH 6.0 5.0 - 8.0   Glucose, UA NEGATIVE NEGATIVE mg/dL   Hgb urine dipstick SMALL (A) NEGATIVE   Bilirubin Urine NEGATIVE NEGATIVE   Ketones, ur NEGATIVE NEGATIVE mg/dL   Protein, ur 100 (A) NEGATIVE mg/dL   Nitrite NEGATIVE NEGATIVE   Leukocytes,Ua LARGE (A) NEGATIVE   RBC / HPF 6-10 0 - 5 RBC/hpf    WBC, UA >50 (H) 0 - 5 WBC/hpf   Bacteria, UA RARE (A) NONE SEEN   WBC Clumps PRESENT     Comment: Performed at [facility], [address] [place], [place] [address]   Ct Abdomen Pelvis Wo Contrast  Result Date: 02/04/2019 CLINICAL DATA:  Abdominal pain, fever, abscess suspected EXAM: CT ABDOMEN AND PELVIS WITHOUT CONTRAST TECHNIQUE: Multidetector CT imaging of the abdomen and pelvis was performed following the standard protocol without IV contrast. COMPARISON:  None. FINDINGS: Lower chest: Lung bases are clear. Normal heart size. No pericardial effusion. Hepatobiliary: No focal liver abnormality is seen. No gallstones, gallbladder wall thickening, or biliary dilatation. Pancreas: Diffuse atrophy. No pancreatic ductal dilatation or surrounding inflammatory changes. Spleen: Normal in size without focal abnormality. Splenic vascular calcifications of the hilum. Adrenals/Urinary Tract: Normal adrenal glands. No visible or contour deforming renal lesions. Asymmetric enlargement of the right kidney with mild hydronephrosis and a elongated calculus measuring up to 6 mm in maximal diameter in the UPJ. There is asymmetrically increased right perinephric stranding. Mild distal right ureterectasis is noted as well with some faint periureteral stranding as well. Nonobstructing calculus present in the lower pole left kidney. No left hydronephrosis. No bladder calculi. Urinary bladder is largely decompressed at the time of exam and therefore poorly evaluated by CT imaging. Stomach/Bowel: Distal esophagus, stomach and duodenal sweep are unremarkable. No small bowel wall thickening or dilatation. No evidence of obstruction. A normal appendix is visualized. No colonic dilatation or wall thickening. Scattered colonic diverticula without focal pericolonic inflammation to suggest diverticulitis. Fecal material in the gluteal cleft. Vascular/Lymphatic: Extensive atherosclerotic calcification of the aorta  and branch vessels. Some reactive retroperitoneal adenopathy. No pathologically enlarged nodes seen in the abdomen or pelvis.   Reproductive: The prostate and seminal vesicles are unremarkable. Other: Mild body wall edema. Soft tissue stranding of the lateral hips. Postsurgical changes in the right inguinal region, correlate for prior coronal hernia repair. Musculoskeletal: No acute osseous abnormality or suspicious osseous lesion. Multilevel degenerative changes are present in the imaged portions of the spine. Additional degenerative changes in hips and pelvis. IMPRESSION: 1. Obstructing right UPJ calculus measuring up to 6 mm in maximal diameter. Mild resulting hydronephrosis. 2. More distal right ureterectasis and periureteral stranding could reflect a an additional recently passed calculus or potential ascending urinary tract infection. Correlate with urinalysis. 3. Additional nonobstructing calculus in the lower pole of the left kidney. 4. Colonic diverticulosis without evidence of diverticulitis. 5. Surgical clips in the right inguinal region, correlate for history of inguinal hernia repair. 6. Aortic Atherosclerosis (ICD10-I70.0). Electronically Signed   By: Lovena Le M.D.   On: 02/04/2019 01:02   Dg Chest Port 1 View  Result Date: 02/03/2019 CLINICAL DATA:  Lethargy and fever. EXAM: PORTABLE CHEST 1 VIEW COMPARISON:  December 29, 2017 FINDINGS: The heart size and mediastinal contours are within normal limits. Both lungs are clear. The visualized skeletal structures are unremarkable. IMPRESSION: No active disease. Electronically Signed   By: Dorise Bullion III M.D   On: 02/03/2019 21:33    Pending Labs Unresulted Labs (From admission, onward)    Start     Ordered   02/04/19 0154  SARS Coronavirus 2 by RT PCR (hospital order, performed in [facility] hospital lab) Nasopharyngeal Nasopharyngeal Swab  Once,   STAT    Comments: For surgery   Question Answer Comment  Is this test for diagnosis or  screening Screening   Symptomatic for COVID-19 as defined by CDC No   Hospitalized for COVID-19 No   Admitted to ICU for COVID-19 No   Previously tested for COVID-19 Yes   Resident in a congregate (group) care setting No   Employed in healthcare setting No      02/04/19 0154   02/03/19 2106  Lactic acid, plasma  Now then every 2 hours,   STAT     02/03/19 2105   02/03/19 2106  Blood Culture (routine x 2)  BLOOD CULTURE X 2,   STAT     02/03/19 2105   02/03/19 2106  Urine culture  ONCE - STAT,   STAT     02/03/19 2105   Signed and Held  HIV Antibody (routine testing w rflx)  (HIV Antibody (Routine testing w reflex) panel)  Once,   R     Signed and Held   Signed and Held  CBC  (heparin)  Once,   R    Comments: Baseline for heparin therapy IF NOT ALREADY DRAWN.  Notify MD if PLT < 100 K.    Signed and Held   Signed and Held  Creatinine, serum  (heparin)  Once,   R    Comments: Baseline for heparin therapy IF NOT ALREADY DRAWN.    Signed and Held   Signed and Held  Comprehensive metabolic panel  Tomorrow morning,   R     Signed and Held   Signed and Held  CBC  Tomorrow morning,   R     Signed and Held          Vitals/Pain Today's Vitals   02/04/19 0000 02/04/19 0030 02/04/19 0100 02/04/19 0130  BP: 130/75 110/71 120/71 122/70  Pulse:      Resp: (!) 21 (!) 21 19 19  Temp:      TempSrc:      SpO2:      PainSc:        Isolation Precautions Airborne and Contact precautions  Medications Medications  piperacillin-tazobactam (ZOSYN) IVPB 3.375 g (0 g Intravenous Stopped 02/04/19 0036)    Mobility non-ambulatory Moderate fall risk   Focused Assessments Septic work up    R Recommendations: See Admitting Provider Note  Report given to:   Additional Notes:    

## 2019-02-04 NOTE — Transfer of Care (Signed)
Immediate Anesthesia Transfer of Care Note  Patient: Lucas Vega  Procedure(s) Performed: CYSTOSCOPY WITH RETROGRADE PYELOGRAM,  AND STENT PLACEMENT urethral dilation (Right Ureter)  Patient Location: PACU  Anesthesia Type:General  Level of Consciousness: drowsy  Airway & Oxygen Therapy: Patient Spontanous Breathing and Patient connected to face mask  Post-op Assessment: Report given to RN and Post -op Vital signs reviewed and stable  Post vital signs: Reviewed and stable  Last Vitals:  Vitals Value Taken Time  BP    Temp    Pulse    Resp 18 02/04/19 0813  SpO2    Vitals shown include unvalidated device data.  Last Pain:  Vitals:   02/04/19 0558  TempSrc: Oral  PainSc:          Complications: No apparent anesthesia complications

## 2019-02-04 NOTE — Anesthesia Procedure Notes (Signed)
Procedure Name: LMA Insertion Date/Time: 02/04/2019 7:39 AM Performed by: Claudia Desanctis, CRNA Pre-anesthesia Checklist: Emergency Drugs available, Patient identified, Suction available and Patient being monitored Patient Re-evaluated:Patient Re-evaluated prior to induction Oxygen Delivery Method: Circle system utilized Preoxygenation: Pre-oxygenation with 100% oxygen Induction Type: IV induction LMA: LMA inserted LMA Size: 4.0 Number of attempts: 1 Placement Confirmation: positive ETCO2 and breath sounds checked- equal and bilateral Tube secured with: Tape Dental Injury: Teeth and Oropharynx as per pre-operative assessment

## 2019-02-04 NOTE — Progress Notes (Signed)
CRITICAL VALUE ALERT  Critical Value:  Lactic acid 2.5   Date & Time Notied:  [id number] 11/28 Provider Notified: Silas [place], NP  Orders Received/Actions taken: will ffup further order

## 2019-02-04 NOTE — Op Note (Addendum)
Operative Note  Preoperative diagnosis:  1.  Right ureteral calculus with urinary tract infection and sepsis  Postoperative diagnosis: 1.  Right ureteral calculus with urinary tract infection and sepsis 2.  Pendulous urethral narrowing/stricture  Procedure(s): 1.  Cystoscopy, right retrograde pyelogram, right ureteral stent placement 2.  Urethral dilation 3.  Difficult Foley catheter placement over a wire  Surgeon: Link Snuffer, MD  Assistants: None  Anesthesia: General  Complications: None immediate  EBL: Minimal  Specimens: 1.  Urine culture  Drains/Catheters: 1.  6 x 26 double-J ureteral stent  Intraoperative findings: 1.  The pendulous urethra was tight and unable to pass the 21 French cystoscope and some mild trauma was made to the anterior urethra as evidenced by a small mucosal flap at the right mid pendulous urethra which prompted urethral dilation over a wire. 2.  Borderline obstructing prostate 3.  No bladder masses. 4.  Right retrograde pyelogram revealed evidence of filling defect at the ureteropelvic junction.  There did not appear to be significant hydronephrosis. 5.efflux of purulent urine after stent placement  Indication: 68 year old male with dementia presented from a care facility with a fever.  CT scan revealed an obstructing right ureteral calculus and therefore due to concern of sepsis decision was made to proceed urgently with ureteral stent placement.  Description of procedure:  The patient was identified and consent was obtained.  The patient was taken to the operating room and placed in the supine position.  The patient was placed under general anesthesia.  Perioperative antibiotics were administered.  The patient was placed in dorsal lithotomy.  Patient was prepped and draped in a standard sterile fashion and a timeout was performed.  A 21 French rigid cystoscope was advanced into the urethra.  The anterior urethra was tight and unable to pass the  scope safely.  A small mucosal flap was made with the scope traumatically which prompted withdrawal of the scope after passing a wire through the true lumen.  With the wire in place, I sequentially dilated from 14 [place] up to 24 [place] over the wire.  I was then able to advance the 21 French rigid cystoscope alongside the wire which passed easily into the bladder with minimal resistance at that point.  The bladder was inspected with no abnormal findings.  The right ureter was cannulated with an open-ended ureteral catheter and a retrograde pyelogram was performed with the findings noted above.  A wire was advanced up to the kidney under fluoroscopic guidance followed by routine placement of a 6 x 26 double-J ureteral stent.  Fluoroscopy confirmed proximal placement and direct visualization confirmed a good coil within the bladder.  There was efflux of purulent urine from the right ureteral orifice after stent placement and therefore urine culture was obtained and sent for specimen.  I readvanced the wire into the bladder and withdrew the scope followed by placement of an 18 [place] council tip catheter over the wire.  This include the operation.  Patient tolerated procedure well was stable postoperatively.  Plan: Continue broad-spectrum antibiotics.  Continue Foley catheter for 5 to 7 days due to urethral trauma.  Of note, there is 20 cc of sterile water and the catheter balloon to help prevent self extraction given his dementia.

## 2019-02-04 NOTE — H&P (Signed)
History and Physical   Lucas Vega [id number] DOB: 19-Oct-1950 DOA: 02/03/2019  Referring MD/NP/PA: Dr. Ralene Bathe  PCP: System, Provider Not In   Outpatient Specialists: None  Patient coming from: [facility]  Chief Complaint: Fever and altered mental  HPI: Lucas Vega is a 68 y.o. male with medical history significant of Alzheimer's dementia, chronic kidney disease, COPD, gait abnormalities, previous history of PE and kidney stones who was brought from [facility] home with more confusion fever and chills.  Patient also has abdominal pain.  Is currently confused due to dementia and unable to give history.  History is provided from people from [facility].  They said he has progressively been more lethargic since yesterday.  He developed fever later today.  His urine was noted to be dark.  Patient was therefore transported to the ER by EMS.  He was found to have a 6 mm left UPJ stone and evidence of infection.  Patient is being admitted with sepsis due to UTI most likely infected kidney stones.  He has smaller ones on the right as well.  Urology has been consulted.  ED Course: Temperature is 101.9 blood pressure 115/87 pulse 88 respiratory 20 oxygen sat 93% room air.  White count 24.8 hemoglobin 10.5.  Potassium 3.01 BUN 34.  Creatinine is 1.95.  Lactic acid 1.2.  Urinalysis showed large leukocytes no nitrites and WBC elevated with bacteria.  Patient initiated on IV antibiotics and being admitted for further treatment  Review of Systems: As per HPI otherwise 10 point review of systems negative.    Past Medical History:  Diagnosis Date  . Alzheimer's disease with early onset (CODE) ([place]) 2015  . Chronic kidney disease   . Convulsions/seizures ([place]) 07/05/2014   last one 1 1/2 years ago as of 12/08/17  . COPD (chronic obstructive pulmonary disease) ([place])   . Depression   . Gait abnormality 10/28/2017  . Gout   . History of kidney stones   . History of pulmonary embolism    after  surgery ? 2004  . Hypertension   . Left foot drop 10/28/2017  . Memory disturbance 01/26/2013  . Pneumonia    as a child  . Pre-diabetes    took Metformin for a while, but after weight loss did not have take it anymore  . Prostate cancer ([place])   . RLS (restless legs syndrome)   . Sleep apnea    "mild case" - tried cpap but he couldn't use it.    Past Surgical History:  Procedure Laterality Date  . ANTERIOR CERVICAL DECOMP/DISCECTOMY FUSION N/A 12/13/2017   Procedure: ANTERIOR CERVICAL DECOMPRESSION/DISCECTOMY FUSION CERVICAL THREE-CERVICAL FOUR;  Surgeon: Kary Kos, MD;  Location: [facility];  Service: Neurosurgery;  Laterality: N/A;  ANTERIOR CERVICAL DECOMPRESSION/DISCECTOMY FUSION CERVICAL THREE-CERVICAL FOUR  . COLONOSCOPY  2009   JAN  . PROSTATECTOMY    . TONSILLECTOMY AND ADENOIDECTOMY       reports that he quit smoking about 6 years ago. He has never used smokeless tobacco. He reports current alcohol use. He reports that he does not use drugs.  No Known Allergies  Family History  Problem Relation Age of Onset  . Dementia Mother   . Pancreatic cancer Father   . Seizures Neg Hx      Prior to Admission medications   Medication Sig Start Date End Date Taking? Authorizing Provider  allopurinol (ZYLOPRIM) 100 MG tablet Take 100 mg by mouth daily.    [provider]  ALPRAZolam Duanne Moron) 0.25  MG tablet Take 0.25 mg by mouth 2 (two) times daily.     [provider]  aspirin 81 MG tablet Take 81 mg by mouth daily.    [provider]  atenolol-chlorthalidone (TENORETIC) 50-25 MG per tablet Take 1 tablet by mouth at bedtime.     [provider]  buPROPion (WELLBUTRIN) 75 MG tablet Take 75 mg by mouth daily.    [provider]  cyclobenzaprine (FLEXERIL) 10 MG tablet Take 1 tablet (10 mg total) by mouth 3 (three) times daily as needed for muscle spasms. 12/14/17   Meyran, Ocie Cornfield, NP  divalproex (DEPAKOTE) 250 MG DR tablet Take 250 mg  by mouth 2 (two) times daily. 01/20/18   [provider]  divalproex (DEPAKOTE) 500 MG DR tablet TAKE 1 TABLET BY MOUTH TWICE DAILY Patient taking differently: Take 500 mg by mouth 2 (two) times daily.  09/01/17   Kathrynn Ducking, MD  HYDROcodone-acetaminophen (NORCO/VICODIN) 5-325 MG tablet Take 1 tablet by mouth every 6 (six) hours as needed for severe pain. 01/28/18   Drenda Freeze, MD  hydrOXYzine (ATARAX/VISTARIL) 25 MG tablet Take 1 tablet (25 mg total) by mouth 3 (three) times daily as needed. For agitation 01/22/18   Sater, Nanine Means, MD  KLOR-CON M20 20 MEQ tablet Take 20 mEq by mouth 2 (two) times daily.  02/18/16   [provider]  megestrol (MEGACE) 40 MG tablet Take 40 mg by mouth daily.     [provider]  memantine (NAMENDA) 10 MG tablet Take 1 tablet (10 mg total) by mouth 2 (two) times daily. 08/29/14   Ward Givens, NP  simvastatin (ZOCOR) 20 MG tablet Take 20 mg by mouth every evening.     [provider]  venlafaxine XR (EFFEXOR-XR) 150 MG 24 hr capsule Take 150 mg by mouth daily. 01/20/18   [provider]  venlafaxine XR (EFFEXOR-XR) 75 MG 24 hr capsule TAKE 1 CAPSULE BY MOUTH ONCE DAILY WITH BREAKFAST Patient taking differently: Take 75 mg by mouth daily with breakfast.  10/28/17   Kathrynn Ducking, MD    Physical Exam: Vitals:   02/04/19 0000 02/04/19 0030 02/04/19 0100 02/04/19 0130  BP: 130/75 110/71 120/71 122/70  Pulse:      Resp: (!) 21 (!) 21 19 19   Temp:      TempSrc:      SpO2:          Constitutional: Confused, no agitation Vitals:   02/04/19 0000 02/04/19 0030 02/04/19 0100 02/04/19 0130  BP: 130/75 110/71 120/71 122/70  Pulse:      Resp: (!) 21 (!) 21 19 19   Temp:      TempSrc:      SpO2:       Eyes: PERRL, lids and conjunctivae normal ENMT: Mucous membranes are dry. Posterior pharynx clear of any exudate or lesions.Normal dentition.  Neck: normal, supple, no masses, no thyromegaly  Respiratory: clear to auscultation bilaterally, no wheezing, no crackles. Normal respiratory effort. No accessory muscle use.  Cardiovascular: Tachycardia, no murmurs / rubs / gallops. No extremity edema. 2+ pedal pulses. No carotid bruits.  Abdomen: Mild suprapubic and CVA tenderness, no masses palpated. No hepatosplenomegaly. Bowel sounds positive.  Musculoskeletal: no clubbing / cyanosis. No joint deformity upper and lower extremities. Good ROM, no contractures. Normal muscle tone.  Skin: no rashes, lesions, ulcers. No induration Neurologic: CN 2-12 grossly intact. Sensation intact, DTR normal. Strength 5/5 in all 4.  Psychiatric: Confused, nonverbal  Labs on Admission: I have personally reviewed following labs and imaging studies  CBC: Recent Labs  Lab 02/03/19 2228  WBC 24.8*  NEUTROABS 21.2*  HGB 10.5*  HCT 33.1*  MCV 92.2  PLT 0000000   Basic Metabolic Panel: Recent Labs  Lab 02/03/19 2228  NA 142  K 3.0*  CL 105  CO2 23  GLUCOSE 196*  BUN 34*  CREATININE 1.95*  CALCIUM 8.5*   GFR: CrCl cannot be calculated (Unknown ideal weight.). Liver Function Tests: Recent Labs  Lab 02/03/19 2228  AST 26  ALT 22  ALKPHOS 74  BILITOT 1.1  PROT 7.2  ALBUMIN 3.1*   No results for input(s): LIPASE, AMYLASE in the last 168 hours. No results for input(s): AMMONIA in the last 168 hours. Coagulation Profile: Recent Labs  Lab 02/03/19 2228  INR 1.3*   Cardiac Enzymes: No results for input(s): CKTOTAL, CKMB, CKMBINDEX, TROPONINI in the last 168 hours. BNP (last 3 results) No results for input(s): PROBNP in the last 8760 hours. HbA1C: No results for input(s): HGBA1C in the last 72 hours. CBG: No results for input(s): GLUCAP in the last 168 hours. Lipid Profile: No results for input(s): CHOL, HDL, LDLCALC, TRIG, CHOLHDL, LDLDIRECT in the last 72 hours. Thyroid Function Tests: No results for input(s): TSH, T4TOTAL, FREET4, T3FREE, THYROIDAB in the last 72 hours.  Anemia Panel: No results for input(s): VITAMINB12, FOLATE, FERRITIN, TIBC, IRON, RETICCTPCT in the last 72 hours. Urine analysis:    Component Value Date/Time   COLORURINE YELLOW 02/04/2019 0030   APPEARANCEUR CLOUDY (A) 02/04/2019 0030   LABSPEC 1.019 02/04/2019 0030   PHURINE 6.0 02/04/2019 0030   GLUCOSEU NEGATIVE 02/04/2019 0030   HGBUR SMALL (A) 02/04/2019 0030   BILIRUBINUR NEGATIVE 02/04/2019 0030   KETONESUR NEGATIVE 02/04/2019 0030   PROTEINUR 100 (A) 02/04/2019 0030   NITRITE NEGATIVE 02/04/2019 0030   LEUKOCYTESUR LARGE (A) 02/04/2019 0030   Sepsis Labs: @LABRCNTIP (procalcitonin:4,lacticidven:4) )No results found for this or any previous visit (from the past 240 hour(s)).   Radiological Exams on Admission: Ct Abdomen Pelvis Wo Contrast  Result Date: 02/04/2019 CLINICAL DATA:  Abdominal pain, fever, abscess suspected EXAM: CT ABDOMEN AND PELVIS WITHOUT CONTRAST TECHNIQUE: Multidetector CT imaging of the abdomen and pelvis was performed following the standard protocol without IV contrast. COMPARISON:  None. FINDINGS: Lower chest: Lung bases are clear. Normal heart size. No pericardial effusion. Hepatobiliary: No focal liver abnormality is seen. No gallstones, gallbladder wall thickening, or biliary dilatation. Pancreas: Diffuse atrophy. No pancreatic ductal dilatation or surrounding inflammatory changes. Spleen: Normal in size without focal abnormality. Splenic vascular calcifications of the hilum. Adrenals/Urinary Tract: Normal adrenal glands. No visible or contour deforming renal lesions. Asymmetric enlargement of the right kidney with mild hydronephrosis and a elongated calculus measuring up to 6 mm in maximal diameter in the UPJ. There is asymmetrically increased right perinephric stranding. Mild distal right ureterectasis is noted as well with some faint periureteral stranding as well. Nonobstructing calculus present in the lower pole left kidney. No left hydronephrosis. No  bladder calculi. Urinary bladder is largely decompressed at the time of exam and therefore poorly evaluated by CT imaging. Stomach/Bowel: Distal esophagus, stomach and duodenal sweep are unremarkable. No small bowel wall thickening or dilatation. No evidence of obstruction. A normal appendix is visualized. No colonic dilatation or wall thickening. Scattered colonic diverticula without focal pericolonic inflammation to suggest diverticulitis. Fecal material in the gluteal cleft. Vascular/Lymphatic: Extensive atherosclerotic calcification of the aorta and branch vessels. Some reactive retroperitoneal  adenopathy. No pathologically enlarged nodes seen in the abdomen or pelvis. Reproductive: The prostate and seminal vesicles are unremarkable. Other: Mild body wall edema. Soft tissue stranding of the lateral hips. Postsurgical changes in the right inguinal region, correlate for prior coronal hernia repair. Musculoskeletal: No acute osseous abnormality or suspicious osseous lesion. Multilevel degenerative changes are present in the imaged portions of the spine. Additional degenerative changes in hips and pelvis. IMPRESSION: 1. Obstructing right UPJ calculus measuring up to 6 mm in maximal diameter. Mild resulting hydronephrosis. 2. More distal right ureterectasis and periureteral stranding could reflect a an additional recently passed calculus or potential ascending urinary tract infection. Correlate with urinalysis. 3. Additional nonobstructing calculus in the lower pole of the left kidney. 4. Colonic diverticulosis without evidence of diverticulitis. 5. Surgical clips in the right inguinal region, correlate for history of inguinal hernia repair. 6. Aortic Atherosclerosis (ICD10-I70.0). Electronically Signed   By: Lovena Le M.D.   On: 02/04/2019 01:02   Dg Chest Port 1 View  Result Date: 02/03/2019 CLINICAL DATA:  Lethargy and fever. EXAM: PORTABLE CHEST 1 VIEW COMPARISON:  December 29, 2017 FINDINGS: The heart  size and mediastinal contours are within normal limits. Both lungs are clear. The visualized skeletal structures are unremarkable. IMPRESSION: No active disease. Electronically Signed   By: Dorise Bullion III M.D   On: 02/03/2019 21:33      Assessment/Plan Principal Problem:   Sepsis secondary to UTI [facility]) Active Problems:   Gait abnormality   Left foot drop   Bilateral nephrolithiasis   Hypokalemia   ARF (acute renal failure) ([place])   Alzheimer disease ([place])   Sepsis ([place])     #1 sepsis due to UTI: Patient is going to be admitted to monitored bed.  Initiate IV fluids IV antibiotics and supportive care.  Urology consulted and urine culture with blood culture obtained.  Follow results and adjust.  #2 multiple kidney stones: Consult urology.  Continue treatment  #3 hypokalemia: Replete potassium  #4 acute kidney injury: Probably prerenal: Continue with gentle hydration  #5 gait abnormalities with left foot drop: Chronic.  Continue careful follow-up with PT  #6 Alzheimer's dementia: Stable no agitation.  Continue to monitor   DVT prophylaxis: Heparin Code Status: DNR Family Communication: None at bedside Disposition Plan: Back to [place] called: Dr. Gloriann Loan, urology Admission status: Inpatient  Severity of Illness: The appropriate patient status for this patient is INPATIENT. Inpatient status is judged to be reasonable and necessary in order to provide the required intensity of service to ensure the patient's safety. The patient's presenting symptoms, physical exam findings, and initial radiographic and laboratory data in the context of their chronic comorbidities is felt to place them at high risk for further clinical deterioration. Furthermore, it is not anticipated that the patient will be medically stable for discharge from the hospital within 2 midnights of admission. The following factors support the patient status of inpatient.   " The patient's presenting  symptoms include confusion abdominal pain with fever. " The worrisome physical exam findings include fever with some mild abdominal tenderness. " The initial radiographic and laboratory data are worrisome because of nephrolithiasis with evidence of UTI. " The chronic co-morbidities include Alzheimer's dementia.   * I certify that at the point of admission it is my clinical judgment that the patient will require inpatient hospital care spanning beyond 2 midnights from the point of admission due to high intensity of service, high risk for further deterioration and high frequency  of surveillance required.Barbette Merino MD [facility] Pager [phone]  If 7PM-7AM, please contact night-coverage [email or web address] Password [email or web address]  02/04/2019, 1:50 AM

## 2019-02-05 ENCOUNTER — Encounter (HOSPITAL_COMMUNITY): Payer: Self-pay | Admitting: Urology

## 2019-02-05 DIAGNOSIS — R7881 Bacteremia: Secondary | ICD-10-CM | POA: Diagnosis present

## 2019-02-05 LAB — CBC WITH DIFFERENTIAL/PLATELET
Abs Immature Granulocytes: 0.13 10*3/uL — ABNORMAL HIGH (ref 0.00–0.07)
Basophils Absolute: 0 10*3/uL (ref 0.0–0.1)
Basophils Relative: 0 %
Eosinophils Absolute: 0 10*3/uL (ref 0.0–0.5)
Eosinophils Relative: 0 %
HCT: 28.7 % — ABNORMAL LOW (ref 39.0–52.0)
Hemoglobin: 9.1 g/dL — ABNORMAL LOW (ref 13.0–17.0)
Immature Granulocytes: 1 %
Lymphocytes Relative: 6 %
Lymphs Abs: 0.9 10*3/uL (ref 0.7–4.0)
MCH: 29.7 pg (ref 26.0–34.0)
MCHC: 31.7 g/dL (ref 30.0–36.0)
MCV: 93.8 fL (ref 80.0–100.0)
Monocytes Absolute: 1.8 10*3/uL — ABNORMAL HIGH (ref 0.1–1.0)
Monocytes Relative: 13 %
Neutro Abs: 11.2 10*3/uL — ABNORMAL HIGH (ref 1.7–7.7)
Neutrophils Relative %: 80 %
Platelets: 203 10*3/uL (ref 150–400)
RBC: 3.06 MIL/uL — ABNORMAL LOW (ref 4.22–5.81)
RDW: 13.8 % (ref 11.5–15.5)
WBC: 14 10*3/uL — ABNORMAL HIGH (ref 4.0–10.5)
nRBC: 0 % (ref 0.0–0.2)

## 2019-02-05 LAB — COMPREHENSIVE METABOLIC PANEL
ALT: 30 U/L (ref 0–44)
AST: 34 U/L (ref 15–41)
Albumin: 2.6 g/dL — ABNORMAL LOW (ref 3.5–5.0)
Alkaline Phosphatase: 55 U/L (ref 38–126)
Anion gap: 9 (ref 5–15)
BUN: 34 mg/dL — ABNORMAL HIGH (ref 8–23)
CO2: 26 mmol/L (ref 22–32)
Calcium: 8.3 mg/dL — ABNORMAL LOW (ref 8.9–10.3)
Chloride: 108 mmol/L (ref 98–111)
Creatinine, Ser: 1.62 mg/dL — ABNORMAL HIGH (ref 0.61–1.24)
GFR calc Af Amer: 49 mL/min — ABNORMAL LOW (ref >60)
GFR calc non Af Amer: 43 mL/min — ABNORMAL LOW (ref >60)
Glucose, Bld: 174 mg/dL — ABNORMAL HIGH (ref 70–99)
Potassium: 2.3 mmol/L — CL (ref 3.5–5.1)
Sodium: 143 mmol/L (ref 135–145)
Total Bilirubin: 0.9 mg/dL (ref 0.3–1.2)
Total Protein: 6.4 g/dL — ABNORMAL LOW (ref 6.5–8.1)

## 2019-02-05 LAB — PHOSPHORUS: Phosphorus: 2.5 mg/dL (ref 2.5–4.6)

## 2019-02-05 LAB — MAGNESIUM: Magnesium: 2.1 mg/dL (ref 1.7–2.4)

## 2019-02-05 MED ORDER — POTASSIUM CHLORIDE IN NACL 40-0.9 MEQ/L-% IV SOLN
INTRAVENOUS | Status: DC
  Administered 2019-02-05 – 2019-02-06 (×3): 100 mL/h via INTRAVENOUS
  Filled 2019-02-05 (×3): qty 1000

## 2019-02-05 MED ORDER — POTASSIUM CHLORIDE CRYS ER 20 MEQ PO TBCR
40.0000 meq | EXTENDED_RELEASE_TABLET | Freq: Three times a day (TID) | ORAL | Status: AC
  Administered 2019-02-05 – 2019-02-06 (×6): 40 meq via ORAL
  Filled 2019-02-05 (×6): qty 2

## 2019-02-05 NOTE — Progress Notes (Signed)
CRITICAL VALUE STICKER  CRITICAL VALUE: K+ 2.3  DATE & TIME NOTIFIED: 02/05/2019   0700  MD NOTIFIED: Sloan Leiter, MD  TIME OF [id number]  RESPONSE: See new orders   Rise Paganini, RN

## 2019-02-05 NOTE — Progress Notes (Signed)
PROGRESS NOTE    Lucas Vega  [id number] DOB: 1950-11-11 DOA: 02/03/2019 PCP: System, Provider Not In    Brief Narrative:  68 year old gentleman with advanced dementia, currently long-term nursing home resident of multiple antidepressants and antipsychotics, total dependent on activities, usually with behavioral disturbances was found quiet and lethargic in the nursing home and brought to the emergency room where he was found with UTI and sepsis with obstructive right UPJ stone.  Patient was admitted with antibiotics, underwent ureteroscopy and stenting.  Blood cultures positive with gram-negative bacteria.   Assessment & Plan:   Principal Problem:   Bacteremia due to Gram-negative bacteria Active Problems:   Gait abnormality   Left foot drop   Bilateral nephrolithiasis   Sepsis secondary to UTI (HCC)   Hypokalemia   ARF (acute renal failure) (HCC)   Alzheimer disease (HCC)   Sepsis ([place])  Sepsis present on admission improving, bacteremia due to gram-negative bacteria, infected hydronephrosis and ureteric stone: Clinically stabilizing.  On Rocephin 2 g daily, continue until final cultures, anticipate antibiotics until Foley catheter is in place or for 10 days. Status post cystoscopy with urethral stricture, ureteroscopy and stenting. Postop management as per urology, advised to leave Foley catheter until follow-up.  Hypokalemia: Severe.  Replaced aggressively by mouth and IV.  Recheck tomorrow morning.  Magnesium phosphorus normal.  Acute renal failure: Gradually improving.  Recheck in the morning.  Advanced dementia, debility and long-term care: Patient on multiple symptom control medications that we will continue.  May need additional doses of benzodiazepine for behavior control. Fall precautions.  Delirium precautions.  DVT prophylaxis: Heparin subcu Code Status: DNR, documentation in the chart Family Communication: Wife at the bedside Disposition Plan: Back to  long-term SNF when stable.   Consultants:   Urology  Procedures:   Cystoscopy and stenting  Antimicrobials:   Rocephin, 01/27/2019>>>   Subjective: Patient seen and examined.  He was awake but confused.  Did not offer much complaints.  He was able to state his name but was confused with his date of birth.  No other overnight events.  Remains afebrile.  Objective: Vitals:   02/04/19 1438 02/04/19 2026 02/05/19 0000 02/05/19 0430  BP:  111/67 109/76 107/75  Pulse:  65 61 65  Resp:  18 18 18   Temp: 98.7 F (37.1 C) 98.4 F (36.9 C) 98.7 F (37.1 C) 98.7 F (37.1 C)  TempSrc: Oral Oral Oral Oral  SpO2:  97% 98% 96%  Weight:      Height:        Intake/Output Summary (Last 24 hours) at 02/05/2019 1154 Last data filed at 02/05/2019 0837 Gross per 24 hour  Intake 2490.53 ml  Output 2200 ml  Net 290.53 ml   Filed Weights   02/04/19 1229  Weight: 81.6 kg    Examination:  General exam: Appears calm and comfortable, pleasantly confused.  Quiet and composed. Respiratory system: Clear to auscultation. Respiratory effort normal.  No added sounds. Cardiovascular system: S1 & S2 heard, RRR. No JVD, murmurs, rubs, gallops or clicks. No pedal edema. Gastrointestinal system: Abdomen is nondistended, soft and nontender. No organomegaly or masses felt. Normal bowel sounds heard. Central nervous system: Alert but not oriented.  No focal neurological deficits. Extremities: Symmetric 5 x 5 power. Skin: No rashes, lesions or ulcers Psychiatry: Judgement and insight appear impaired.  Mood & affect flat.    Data Reviewed: I have personally reviewed following labs and imaging studies  CBC: Recent Labs  Lab 02/03/19 2228 02/04/19  [id number] 02/05/19 0526  WBC 24.8* 21.5* 14.0*  NEUTROABS 21.2*  --  11.2*  HGB 10.5* 10.7* 9.1*  HCT 33.1* 34.1* 28.7*  MCV 92.2 95.5 93.8  PLT 261 234 [id number]   Basic Metabolic Panel: Recent Labs  Lab 02/03/19 2228 02/04/19 0353 02/05/19 0526  NA  142 144 143  K 3.0* 3.0* 2.3*  CL 105 107 108  CO2 23 24 26   GLUCOSE 196* 223* 174*  BUN 34* 34* 34*  CREATININE 1.95* 2.14* 1.62*  CALCIUM 8.5* 9.0 8.3*  MG  --   --  2.1  PHOS  --   --  2.5   GFR: Estimated Creatinine Clearance: 43 mL/min (A) (by C-G formula based on SCr of 1.62 mg/dL (H)). Liver Function Tests: Recent Labs  Lab 02/03/19 2228 02/04/19 0353 02/05/19 0526  AST 26 24 34  ALT 22 22 30   ALKPHOS 74 70 55  BILITOT 1.1 1.5* 0.9  PROT 7.2 7.4 6.4*  ALBUMIN 3.1* 3.2* 2.6*   No results for input(s): LIPASE, AMYLASE in the last 168 hours. No results for input(s): AMMONIA in the last 168 hours. Coagulation Profile: Recent Labs  Lab 02/03/19 2228  INR 1.3*   Cardiac Enzymes: No results for input(s): CKTOTAL, CKMB, CKMBINDEX, TROPONINI in the last 168 hours. BNP (last 3 results) No results for input(s): PROBNP in the last 8760 hours. HbA1C: No results for input(s): HGBA1C in the last 72 hours. CBG: No results for input(s): GLUCAP in the last 168 hours. Lipid Profile: No results for input(s): CHOL, HDL, LDLCALC, TRIG, CHOLHDL, LDLDIRECT in the last 72 hours. Thyroid Function Tests: No results for input(s): TSH, T4TOTAL, FREET4, T3FREE, THYROIDAB in the last 72 hours. Anemia Panel: No results for input(s): VITAMINB12, FOLATE, FERRITIN, TIBC, IRON, RETICCTPCT in the last 72 hours. Sepsis Labs: Recent Labs  Lab 02/03/19 2228 02/04/19 0353  LATICACIDVEN 1.2 2.5*    Recent Results (from the past 240 hour(s))  Blood Culture (routine x 2)     Status: None (Preliminary result)   Collection Time: 02/03/19 10:29 PM   Specimen: BLOOD LEFT FOREARM  Result Value Ref Range Status   Specimen Description   Final    BLOOD LEFT FOREARM Performed at [facility] [address], [place], [place] [address]    Special Requests   Final    BOTTLES DRAWN AEROBIC AND ANAEROBIC Blood Culture adequate volume Performed at [facility] [address], [place], [place] [address]    Culture  Setup Time   Final    GRAM NEGATIVE RODS ANAEROBIC BOTTLE ONLY CRITICAL RESULT CALLED TO, READ BACK BY AND VERIFIED WITH: PHARMD DREW W. [id number] FCP     Culture   Final    GRAM NEGATIVE RODS IDENTIFICATION AND SUSCEPTIBILITIES TO FOLLOW Performed at [facility], [place] [address] [place], [place] [address]    Report Status PENDING  Incomplete  Blood Culture (routine x 2)     Status: None (Preliminary result)   Collection Time: 02/03/19 10:29 PM   Specimen: BLOOD  Result Value Ref Range Status   Specimen Description   Final    BLOOD LEFT ANTECUBITAL Performed at [facility] [address] [place], [place] [address]    Special Requests   Final    BOTTLES DRAWN AEROBIC AND ANAEROBIC Blood Culture adequate volume Performed at [facility] [address] [place], [place] [address]    Culture   Final  NO GROWTH 1 DAY Performed at [facility], [place] [address] [place], [place] [address]    Report Status PENDING  Incomplete  Blood Culture ID Panel (Reflexed)     Status: Abnormal   Collection Time: 02/03/19 10:29 PM  Result Value Ref Range Status   Enterococcus species NOT DETECTED NOT DETECTED Final   Listeria monocytogenes NOT DETECTED NOT DETECTED Final   Staphylococcus species NOT DETECTED NOT DETECTED Final   Staphylococcus aureus (BCID) NOT DETECTED NOT DETECTED Final   Streptococcus species NOT DETECTED NOT DETECTED Final   Streptococcus agalactiae NOT DETECTED NOT DETECTED Final   Streptococcus pneumoniae NOT DETECTED NOT DETECTED Final   Streptococcus pyogenes NOT DETECTED NOT DETECTED Final   Acinetobacter baumannii NOT DETECTED NOT DETECTED Final   Enterobacteriaceae species DETECTED (A) NOT DETECTED Final    Comment: Enterobacteriaceae represent a large family of gram-negative bacteria, not a single organism. CRITICAL RESULT CALLED TO, READ BACK BY AND  VERIFIED WITH: PHARMD DREW W. [id number] FCP     Enterobacter cloacae complex NOT DETECTED NOT DETECTED Final   Escherichia coli NOT DETECTED NOT DETECTED Final   Klebsiella oxytoca NOT DETECTED NOT DETECTED Final   Klebsiella pneumoniae NOT DETECTED NOT DETECTED Final   Proteus species DETECTED (A) NOT DETECTED Final    Comment: CRITICAL RESULT CALLED TO, READ BACK BY AND VERIFIED WITH: PHARMD DREW W. [id number] FCP     Serratia marcescens NOT DETECTED NOT DETECTED Final   Carbapenem resistance NOT DETECTED NOT DETECTED Final   Haemophilus influenzae NOT DETECTED NOT DETECTED Final   Neisseria meningitidis NOT DETECTED NOT DETECTED Final   Pseudomonas aeruginosa NOT DETECTED NOT DETECTED Final   Candida albicans NOT DETECTED NOT DETECTED Final   Candida glabrata NOT DETECTED NOT DETECTED Final   Candida krusei NOT DETECTED NOT DETECTED Final   Candida parapsilosis NOT DETECTED NOT DETECTED Final   Candida tropicalis NOT DETECTED NOT DETECTED Final    Comment: Performed at [facility], [place] [address] [place], [place] [address]  Urine culture     Status: Abnormal (Preliminary result)   Collection Time: 02/04/19 12:30 AM   Specimen: In/Out Cath Urine  Result Value Ref Range Status   Specimen Description   Final    IN/OUT CATH URINE Performed at [facility] [address] [place], [place] [address]    Special Requests   Final    NONE Performed at [facility], [place] [address], [place],  [address]    Culture 10,000 COLONIES/mL GRAM NEGATIVE RODS (A)  Final   Report Status PENDING  Incomplete  SARS Coronavirus 2 by RT PCR (hospital order, performed in [facility] hospital lab) Nasopharyngeal Nasopharyngeal Swab     Status: None   Collection Time: 02/04/19  2:27 AM   Specimen: Nasopharyngeal Swab  Result Value Ref Range Status   SARS Coronavirus 2 NEGATIVE NEGATIVE Final    Comment: (NOTE) SARS-CoV-2 target nucleic acids are  NOT DETECTED. The SARS-CoV-2 RNA is generally detectable in upper and lower respiratory specimens during the acute phase of infection. The lowest concentration of SARS-CoV-2 viral copies this assay can detect is 250 copies / mL. A negative result does not preclude SARS-CoV-2 infection and should not be used as the sole basis for treatment or other patient management decisions.  A negative result may occur with improper specimen collection / handling, submission of specimen other than nasopharyngeal swab, presence of viral mutation(s) within the areas targeted  by this assay, and inadequate number of viral copies (<250 copies / mL). A negative result must be combined with clinical observations, patient history, and epidemiological information. Fact Sheet for Patients:   StrictlyIdeas.no Fact Sheet for Healthcare Providers: BankingDealers.co.za This test is not yet approved or cleared  by the [place] FDA and has been authorized for detection and/or diagnosis of SARS-CoV-2 by FDA under an Emergency Use Authorization (EUA).  This EUA will remain in effect (meaning this test can be used) for the duration of the COVID-19 declaration under Section 564(b)(1) of the Act, 21 U.S.C. section 360bbb-3(b)(1), unless the authorization is terminated or revoked sooner. Performed at [facility], [place] [address], [place], [place] [address]   Surgical pcr screen     Status: None   Collection Time: 02/04/19  3:49 AM   Specimen: Nasal Mucosa; Nasal Swab  Result Value Ref Range Status   MRSA, PCR NEGATIVE NEGATIVE Final   Staphylococcus aureus NEGATIVE NEGATIVE Final    Comment: (NOTE) The Xpert SA Assay (FDA approved for NASAL specimens in patients 54 years of age and older), is one component of a comprehensive surveillance program. It is not intended to diagnose infection nor to guide or monitor treatment. Performed at [facility], [place] [address] [place], [place] [address]   Urine Culture     Status: Abnormal (Preliminary result)   Collection Time: 02/04/19  8:00 AM   Specimen: Urine, Cystoscope  Result Value Ref Range Status   Specimen Description   Final    CYSTOSCOPY Performed at [facility] [address], [place],  [address]    Special Requests   Final    NONE Performed at [facility], [place] [address] [place],  [address]    Culture 3,000 COLONIES/mL GRAM NEGATIVE RODS (A)  Final   Report Status PENDING  Incomplete         Radiology Studies: Ct Abdomen Pelvis Wo Contrast  Result Date: 02/04/2019 CLINICAL DATA:  Abdominal pain, fever, abscess suspected EXAM: CT ABDOMEN AND PELVIS WITHOUT CONTRAST TECHNIQUE: Multidetector CT imaging of the abdomen and pelvis was performed following the standard protocol without IV contrast. COMPARISON:  None. FINDINGS: Lower chest: Lung bases are clear. Normal heart size. No pericardial effusion. Hepatobiliary: No focal liver abnormality is seen. No gallstones, gallbladder wall thickening, or biliary dilatation. Pancreas: Diffuse atrophy. No pancreatic ductal dilatation or surrounding inflammatory changes. Spleen: Normal in size without focal abnormality. Splenic vascular calcifications of the hilum. Adrenals/Urinary Tract: Normal adrenal glands. No visible or contour deforming renal lesions. Asymmetric enlargement of the right kidney with mild hydronephrosis and a elongated calculus measuring up to 6 mm in maximal diameter in the UPJ. There is asymmetrically increased right perinephric stranding. Mild distal right ureterectasis is noted as well with some faint periureteral stranding as well. Nonobstructing calculus present in the lower pole left kidney. No left hydronephrosis. No bladder calculi. Urinary bladder is largely decompressed at the time of exam and therefore poorly evaluated by CT imaging.  Stomach/Bowel: Distal esophagus, stomach and duodenal sweep are unremarkable. No small bowel wall thickening or dilatation. No evidence of obstruction. A normal appendix is visualized. No colonic dilatation or wall thickening. Scattered colonic diverticula without focal pericolonic inflammation to suggest diverticulitis. Fecal material in the gluteal cleft. Vascular/Lymphatic: Extensive atherosclerotic calcification of the aorta and branch vessels. Some reactive retroperitoneal adenopathy. No pathologically enlarged nodes seen in the abdomen or pelvis. Reproductive: The prostate and seminal vesicles  are unremarkable. Other: Mild body wall edema. Soft tissue stranding of the lateral hips. Postsurgical changes in the right inguinal region, correlate for prior coronal hernia repair. Musculoskeletal: No acute osseous abnormality or suspicious osseous lesion. Multilevel degenerative changes are present in the imaged portions of the spine. Additional degenerative changes in hips and pelvis. IMPRESSION: 1. Obstructing right UPJ calculus measuring up to 6 mm in maximal diameter. Mild resulting hydronephrosis. 2. More distal right ureterectasis and periureteral stranding could reflect a an additional recently passed calculus or potential ascending urinary tract infection. Correlate with urinalysis. 3. Additional nonobstructing calculus in the lower pole of the left kidney. 4. Colonic diverticulosis without evidence of diverticulitis. 5. Surgical clips in the right inguinal region, correlate for history of inguinal hernia repair. 6. Aortic Atherosclerosis (ICD10-I70.0). Electronically Signed   By: Lovena Le M.D.   On: 02/04/2019 01:02   Dg Chest Port 1 View  Result Date: 02/03/2019 CLINICAL DATA:  Lethargy and fever. EXAM: PORTABLE CHEST 1 VIEW COMPARISON:  December 29, 2017 FINDINGS: The heart size and mediastinal contours are within normal limits. Both lungs are clear. The visualized skeletal structures are  unremarkable. IMPRESSION: No active disease. Electronically Signed   By: Dorise Bullion III M.D   On: 02/03/2019 21:33   Dg C-arm 1-60 Min-no Report  Result Date: 02/04/2019 Fluoroscopy was utilized by the requesting physician.  No radiographic interpretation.        Scheduled Meds:  acidophilus  1 capsule Oral Daily   aspirin  81 mg Oral Daily   atenolol  25 mg Oral QHS   And   chlorthalidone  12.5 mg Oral QHS   atorvastatin  10 mg Oral q1800   Chlorhexidine Gluconate Cloth  6 each Topical Q0600   cholecalciferol  2,000 Units Oral Daily   divalproex  250 mg Oral BID   heparin  5,000 Units Subcutaneous Q8H   Melatonin  3 mg Oral QHS   memantine  10 mg Oral BID   polyethylene glycol  17 g Oral Daily   potassium chloride  40 mEq Oral TID   risperiDONE  0.5 mg Oral BID   sertraline  100 mg Oral Daily   Continuous Infusions:  0.9 % NaCl with KCl 40 mEq / L 100 mL/hr (02/05/19 0841)   cefTRIAXone (ROCEPHIN)  IV 2 g (02/04/19 2053)     LOS: 1 day    Time spent: 35 minutes    Barb Merino, MD [facility] Pager [phone]

## 2019-02-06 LAB — URINE CULTURE: Culture: 10000 — AB

## 2019-02-06 LAB — CBC WITH DIFFERENTIAL/PLATELET
Abs Immature Granulocytes: 0.07 10*3/uL (ref 0.00–0.07)
Basophils Absolute: 0 10*3/uL (ref 0.0–0.1)
Basophils Relative: 0 %
Eosinophils Absolute: 0.1 10*3/uL (ref 0.0–0.5)
Eosinophils Relative: 1 %
HCT: 27.9 % — ABNORMAL LOW (ref 39.0–52.0)
Hemoglobin: 8.9 g/dL — ABNORMAL LOW (ref 13.0–17.0)
Immature Granulocytes: 1 %
Lymphocytes Relative: 10 %
Lymphs Abs: 1 10*3/uL (ref 0.7–4.0)
MCH: 29.4 pg (ref 26.0–34.0)
MCHC: 31.9 g/dL (ref 30.0–36.0)
MCV: 92.1 fL (ref 80.0–100.0)
Monocytes Absolute: 1.3 10*3/uL — ABNORMAL HIGH (ref 0.1–1.0)
Monocytes Relative: 13 %
Neutro Abs: 7.3 10*3/uL (ref 1.7–7.7)
Neutrophils Relative %: 75 %
Platelets: 210 10*3/uL (ref 150–400)
RBC: 3.03 MIL/uL — ABNORMAL LOW (ref 4.22–5.81)
RDW: 13.6 % (ref 11.5–15.5)
WBC: 9.7 10*3/uL (ref 4.0–10.5)
nRBC: 0 % (ref 0.0–0.2)

## 2019-02-06 LAB — CULTURE, BLOOD (ROUTINE X 2): Special Requests: ADEQUATE

## 2019-02-06 LAB — BASIC METABOLIC PANEL
Anion gap: 8 (ref 5–15)
BUN: 28 mg/dL — ABNORMAL HIGH (ref 8–23)
CO2: 24 mmol/L (ref 22–32)
Calcium: 8.2 mg/dL — ABNORMAL LOW (ref 8.9–10.3)
Chloride: 113 mmol/L — ABNORMAL HIGH (ref 98–111)
Creatinine, Ser: 1.27 mg/dL — ABNORMAL HIGH (ref 0.61–1.24)
GFR calc Af Amer: >60 mL/min (ref >60)
GFR calc non Af Amer: 57 mL/min — ABNORMAL LOW (ref >60)
Glucose, Bld: 141 mg/dL — ABNORMAL HIGH (ref 70–99)
Potassium: 3.3 mmol/L — ABNORMAL LOW (ref 3.5–5.1)
Sodium: 145 mmol/L (ref 135–145)

## 2019-02-06 NOTE — TOC Initial Note (Signed)
Transition of Care [facility]) - Initial/Assessment Note    Patient Details  Name: Lucas Vega MRN: [id number] Date of Birth: 06/22/1950  [facility] CM/SW Contact:    Dessa Phi, RN Phone Number: 02/06/2019, 2:40 PM  Clinical Narrative: From [place] rep [employer] [facility] following. Will d/c back in am.                  Expected Discharge Plan: [place] Barriers to Discharge: Continued Medical Work up   Patient Goals and CMS Choice        Expected Discharge Plan and Services Expected Discharge Plan: [place]   Discharge Planning Services: CM Consult                                          Prior Living Arrangements/Services   Lives with:: Facility Resident Patient language and need for interpreter reviewed:: Yes Do you feel safe going back to the place where you live?: Yes      Need for Family Participation in Patient Care: No (Comment) Care giver support system in place?: Yes (comment)   Criminal Activity/Legal Involvement Pertinent to Current Situation/Hospitalization: No - Comment as needed  Activities of Daily Living Home Assistive Devices/Equipment: Wheelchair ADL Screening (condition at time of admission) Patient's cognitive ability adequate to safely complete daily activities?: Yes Is the patient deaf or have difficulty hearing?: No Does the patient have difficulty seeing, even when wearing glasses/contacts?: No Does the patient have difficulty concentrating, remembering, or making decisions?: Yes Patient able to express need for assistance with ADLs?: Yes Does the patient have difficulty dressing or bathing?: Yes Independently performs ADLs?: No Communication: Needs assistance Is this a change from baseline?: Pre-admission baseline Dressing (OT): Dependent Is this a change from baseline?: Pre-admission baseline Grooming: Dependent Is this a change from baseline?: Pre-admission baseline Feeding:  Dependent Is this a change from baseline?: Pre-admission baseline Bathing: Dependent Is this a change from baseline?: Pre-admission baseline Toileting: Dependent Is this a change from baseline?: Pre-admission baseline In/Out Bed: Dependent Is this a change from baseline?: Pre-admission baseline Walks in Home: Dependent Is this a change from baseline?: Pre-admission baseline Does the patient have difficulty walking or climbing stairs?: Yes Weakness of Legs: Both Weakness of Arms/Hands: Both  Permission Sought/Granted Permission sought to share information with : Case Manager Permission granted to share information with : Yes, Verbal Permission Granted              Emotional Assessment Appearance:: Appears stated age Attitude/Demeanor/Rapport: Gracious Affect (typically observed): Accepting Orientation: : Oriented to Self Alcohol / Substance Use: Not Applicable    Admission diagnosis:  Fever and Abd. Pain Patient Active Problem List   Diagnosis Date Noted  . Bacteremia due to Gram-negative bacteria 02/05/2019  . Bilateral nephrolithiasis 02/04/2019  . Sepsis secondary to UTI ([place]) 02/04/2019  . Hypokalemia 02/04/2019  . ARF (acute renal failure) ([place]) 02/04/2019  . Alzheimer disease ([place]) 02/04/2019  . Sepsis ([place]) 02/04/2019  . Myelopathy ([place]) 12/13/2017  . Gait abnormality 10/28/2017  . Left foot drop 10/28/2017  . Convulsions/seizures ([place]) 07/05/2014  . Right shoulder pain 07/05/2014  . Memory disturbance 01/26/2013   PCP:  Mayra Neer, MD Pharmacy:   [facility], [place] [address] Phone: [phone] Fax: [phone]     Social Determinants  of Health (SDOH) Interventions    Readmission Risk Interventions Readmission Risk Prevention Plan 02/06/2019  PCP or Specialist Appt within 3-5 Days Complete  HRI or [place] Complete  Social Work Consult for  [place] Planning/Counseling Complete  Palliative Care Screening Not Applicable  Medication Review Press photographer) Complete  Some recent data might be hidden

## 2019-02-06 NOTE — Progress Notes (Signed)
PROGRESS NOTE    Lucas Vega  [id number] DOB: 17-Jan-1951 DOA: 02/03/2019 PCP: System, Provider Not In   Brief Narrative:  Patient is a 68 year old male with history of advanced dementia, currently on long-term nursing home, total dependent on activities, who was found to be quite slightly drowsy in the nursing home.  In the emergency department he was found to have urinary tract infection and sepsis with obstructed right UPJ stone.  Urology consulted and he underwent cystoscopy, right ureteral stent placement, urethral dilation and Foley catheter placement.  Blood culture/urine culture showed pansensitive Proteus.  Currently on IV antibiotics.  Plan for discharge tomorrow back to skilled nursing facility.  Assessment & Plan:   Principal Problem:   Bacteremia due to Gram-negative bacteria Active Problems:   Gait abnormality   Left foot drop   Bilateral nephrolithiasis   Sepsis secondary to UTI (HCC)   Hypokalemia   ARF (acute renal failure) (HCC)   Alzheimer disease (HCC)   Sepsis ([place])   Gram-negative bacteremia/sepsis present on admission: Blood culture/urine culture showed Proteus.  Sources infected stone on the right side.  Underwent cystoscopy, right ureteral stent placement, urethral dilation and Foley catheter placement by urology.  He will be discharged on Foley catheter.  Follow-up with urology as an outpatient.  Will change antibiotics to oral tomorrow.  Continue Rocephin for today.  Hypokalemia: Being supplemented  AKI: Improved with IV fluids.  Advanced dementia: Long-term nursing care facility resident.  Continue to monitor mental status.  Delirium precautions.  On risperidone, sertraline, memantine.         DVT prophylaxis: Heparin subcu Code Status: DNR Family Communication: None present at the bedside Disposition Plan: Skilled nursing facility tomorrow   Consultants: Urology  Procedures: Cystoscopy  Antimicrobials:  Anti-infectives (From  admission, onward)   Start     Dose/Rate Route Frequency Ordered Stop   02/04/19 2000  cefTRIAXone (ROCEPHIN) 2 g in sodium chloride 0.9 % 100 mL IVPB     2 g 200 mL/hr over 30 Minutes Intravenous Daily 02/04/19 1926     02/04/19 0500  cefTRIAXone (ROCEPHIN) 1 g in sodium chloride 0.9 % 100 mL IVPB  Status:  Discontinued     1 g 200 mL/hr over 30 Minutes Intravenous Daily 02/04/19 0311 02/04/19 1926   02/03/19 2315  piperacillin-tazobactam (ZOSYN) IVPB 3.375 g     3.375 g 100 mL/hr over 30 Minutes Intravenous  Once 02/03/19 2313 02/04/19 0036      Subjective:  Patient seen and examined the bedside this morning.  Hemodynamically stable.  Alert, awake but not oriented.  No meaningful conversation.  Says that he is [employer].  Objective: Vitals:   02/05/19 1316 02/05/19 1956 02/06/19 0630 02/06/19 0740  BP: 113/74 111/76 112/75 120/72  Pulse: 69 65 64 61  Resp: 18 18 18 18   Temp: 98.4 F (36.9 C) 98.3 F (36.8 C) 98.7 F (37.1 C) 97.9 F (36.6 C)  TempSrc: Oral Oral Oral Oral  SpO2: 99% 98% 97% 98%  Weight:      Height:        Intake/Output Summary (Last 24 hours) at 02/06/2019 1349 Last data filed at 02/06/2019 [id number] Gross per 24 hour  Intake 2914.76 ml  Output 1750 ml  Net 1164.76 ml   Filed Weights   02/04/19 1229  Weight: 81.6 kg    Examination:  General exam: ,Not in distress,average built HEENT:PERRL,Ear/Nose normal on gross exam Respiratory system: Bilateral equal air entry, normal vesicular breath sounds, no wheezes  or crackles  Cardiovascular system: S1 & S2 heard, RRR. No JVD, murmurs, rubs, gallops or clicks. No pedal edema. Gastrointestinal system: Abdomen is nondistended, soft and nontender. No organomegaly or masses felt. Normal bowel sounds heard. Central nervous system: Alert and awake but not oriented. Extremities: No edema, no clubbing ,no cyanosis, distal peripheral pulses palpable. Skin: No rashes, lesions or ulcers,no icterus ,no pallor  GU: Foley    Data Reviewed: I have personally reviewed following labs and imaging studies  CBC: Recent Labs  Lab 02/03/19 2228 02/04/19 0353 02/05/19 0526 02/06/19 0450  WBC 24.8* 21.5* 14.0* 9.7  NEUTROABS 21.2*  --  11.2* 7.3  HGB 10.5* 10.7* 9.1* 8.9*  HCT 33.1* 34.1* 28.7* 27.9*  MCV 92.2 95.5 93.8 92.1  PLT 261 234 203 [id number]   Basic Metabolic Panel: Recent Labs  Lab 02/03/19 2228 02/04/19 0353 02/05/19 0526 02/06/19 0450  NA 142 144 143 145  K 3.0* 3.0* 2.3* 3.3*  CL 105 107 108 113*  CO2 23 24 26 24   GLUCOSE 196* 223* 174* 141*  BUN 34* 34* 34* 28*  CREATININE 1.95* 2.14* 1.62* 1.27*  CALCIUM 8.5* 9.0 8.3* 8.2*  MG  --   --  2.1  --   PHOS  --   --  2.5  --    GFR: Estimated Creatinine Clearance: 54.9 mL/min (A) (by C-G formula based on SCr of 1.27 mg/dL (H)). Liver Function Tests: Recent Labs  Lab 02/03/19 2228 02/04/19 0353 02/05/19 0526  AST 26 24 34  ALT 22 22 30   ALKPHOS 74 70 55  BILITOT 1.1 1.5* 0.9  PROT 7.2 7.4 6.4*  ALBUMIN 3.1* 3.2* 2.6*   No results for input(s): LIPASE, AMYLASE in the last 168 hours. No results for input(s): AMMONIA in the last 168 hours. Coagulation Profile: Recent Labs  Lab 02/03/19 2228  INR 1.3*   Cardiac Enzymes: No results for input(s): CKTOTAL, CKMB, CKMBINDEX, TROPONINI in the last 168 hours. BNP (last 3 results) No results for input(s): PROBNP in the last 8760 hours. HbA1C: No results for input(s): HGBA1C in the last 72 hours. CBG: No results for input(s): GLUCAP in the last 168 hours. Lipid Profile: No results for input(s): CHOL, HDL, LDLCALC, TRIG, CHOLHDL, LDLDIRECT in the last 72 hours. Thyroid Function Tests: No results for input(s): TSH, T4TOTAL, FREET4, T3FREE, THYROIDAB in the last 72 hours. Anemia Panel: No results for input(s): VITAMINB12, FOLATE, FERRITIN, TIBC, IRON, RETICCTPCT in the last 72 hours. Sepsis Labs: Recent Labs  Lab 02/03/19 2228 02/04/19 0353  LATICACIDVEN 1.2 2.5*     Recent Results (from the past 240 hour(s))  Blood Culture (routine x 2)     Status: Abnormal   Collection Time: 02/03/19 10:29 PM   Specimen: BLOOD LEFT FOREARM  Result Value Ref Range Status   Specimen Description   Final    BLOOD LEFT FOREARM Performed at [facility] [address] [place], [place] [address]    Special Requests   Final    BOTTLES DRAWN AEROBIC AND ANAEROBIC Blood Culture adequate volume Performed at [facility] [address] [place], [place] [address]    Culture  Setup Time   Final    GRAM NEGATIVE RODS ANAEROBIC BOTTLE ONLY CRITICAL RESULT CALLED TO, READ BACK BY AND VERIFIED WITH: Edgard [id number] FCP  Performed at [facility], [place] [address] [place], [place] [address]    Culture PROTEUS MIRABILIS (A)  Final   Report  Status 02/06/2019 FINAL  Final   Organism ID, Bacteria PROTEUS MIRABILIS  Final      Susceptibility   Proteus mirabilis - MIC*    AMPICILLIN <=2 SENSITIVE Sensitive     CEFAZOLIN <=4 SENSITIVE Sensitive     CEFEPIME <=1 SENSITIVE Sensitive     CEFTAZIDIME <=1 SENSITIVE Sensitive     CEFTRIAXONE <=1 SENSITIVE Sensitive     CIPROFLOXACIN 2 INTERMEDIATE Intermediate     GENTAMICIN <=1 SENSITIVE Sensitive     IMIPENEM 2 SENSITIVE Sensitive     TRIMETH/SULFA <=20 SENSITIVE Sensitive     AMPICILLIN/SULBACTAM <=2 SENSITIVE Sensitive     PIP/TAZO <=4 SENSITIVE Sensitive     * PROTEUS MIRABILIS  Blood Culture (routine x 2)     Status: None (Preliminary result)   Collection Time: 02/03/19 10:29 PM   Specimen: BLOOD  Result Value Ref Range Status   Specimen Description   Final    BLOOD LEFT ANTECUBITAL Performed at [facility] [address] [place], [place] [address]    Special Requests   Final    BOTTLES DRAWN AEROBIC AND ANAEROBIC Blood Culture adequate volume Performed at [facility] [address] [place], [place] [address]     Culture   Final    NO GROWTH 2 DAYS Performed at [facility] [address], [place], [place] [address]    Report Status PENDING  Incomplete  Blood Culture ID Panel (Reflexed)     Status: Abnormal   Collection Time: 02/03/19 10:29 PM  Result Value Ref Range Status   Enterococcus species NOT DETECTED NOT DETECTED Final   Listeria monocytogenes NOT DETECTED NOT DETECTED Final   Staphylococcus species NOT DETECTED NOT DETECTED Final   Staphylococcus aureus (BCID) NOT DETECTED NOT DETECTED Final   Streptococcus species NOT DETECTED NOT DETECTED Final   Streptococcus agalactiae NOT DETECTED NOT DETECTED Final   Streptococcus pneumoniae NOT DETECTED NOT DETECTED Final   Streptococcus pyogenes NOT DETECTED NOT DETECTED Final   Acinetobacter baumannii NOT DETECTED NOT DETECTED Final   Enterobacteriaceae species DETECTED (A) NOT DETECTED Final    Comment: Enterobacteriaceae represent a large family of gram-negative bacteria, not a single organism. CRITICAL RESULT CALLED TO, READ BACK BY AND VERIFIED WITH: PHARMD DREW W. [id number] FCP     Enterobacter cloacae complex NOT DETECTED NOT DETECTED Final   Escherichia coli NOT DETECTED NOT DETECTED Final   Klebsiella oxytoca NOT DETECTED NOT DETECTED Final   Klebsiella pneumoniae NOT DETECTED NOT DETECTED Final   Proteus species DETECTED (A) NOT DETECTED Final    Comment: CRITICAL RESULT CALLED TO, READ BACK BY AND VERIFIED WITH: PHARMD DREW W. [id number] FCP     Serratia marcescens NOT DETECTED NOT DETECTED Final   Carbapenem resistance NOT DETECTED NOT DETECTED Final   Haemophilus influenzae NOT DETECTED NOT DETECTED Final   Neisseria meningitidis NOT DETECTED NOT DETECTED Final   Pseudomonas aeruginosa NOT DETECTED NOT DETECTED Final   Candida albicans NOT DETECTED NOT DETECTED Final   Candida glabrata NOT DETECTED NOT DETECTED Final   Candida krusei NOT DETECTED NOT DETECTED Final   Candida parapsilosis NOT DETECTED NOT DETECTED  Final   Candida tropicalis NOT DETECTED NOT DETECTED Final    Comment: Performed at [facility], [place] [address] [place], [place] [address]  Urine culture     Status: Abnormal (Preliminary result)   Collection Time: 02/04/19 12:30 AM   Specimen: In/Out Cath Urine  Result Value Ref  Range Status   Specimen Description   Final    IN/OUT CATH URINE Performed at [facility] [address], [place], [place] [address]    Special Requests   Final    NONE Performed at [facility], [place] [address], [place], [place] [address]    Culture 10,000 COLONIES/mL PROTEUS MIRABILIS (A)  Final   Report Status PENDING  Incomplete  SARS Coronavirus 2 by RT PCR (hospital order, performed in [facility] hospital lab) Nasopharyngeal Nasopharyngeal Swab     Status: None   Collection Time: 02/04/19  2:27 AM   Specimen: Nasopharyngeal Swab  Result Value Ref Range Status   SARS Coronavirus 2 NEGATIVE NEGATIVE Final    Comment: (NOTE) SARS-CoV-2 target nucleic acids are NOT DETECTED. The SARS-CoV-2 RNA is generally detectable in upper and lower respiratory specimens during the acute phase of infection. The lowest concentration of SARS-CoV-2 viral copies this assay can detect is 250 copies / mL. A negative result does not preclude SARS-CoV-2 infection and should not be used as the sole basis for treatment or other patient management decisions.  A negative result may occur with improper specimen collection / handling, submission of specimen other than nasopharyngeal swab, presence of viral mutation(s) within the areas targeted by this assay, and inadequate number of viral copies (<250 copies / mL). A negative result must be combined with clinical observations, patient history, and epidemiological information. Fact Sheet for Patients:   StrictlyIdeas.no Fact Sheet for Healthcare Providers: BankingDealers.co.za This test  is not yet approved or cleared  by the [place] FDA and has been authorized for detection and/or diagnosis of SARS-CoV-2 by FDA under an Emergency Use Authorization (EUA).  This EUA will remain in effect (meaning this test can be used) for the duration of the COVID-19 declaration under Section 564(b)(1) of the Act, 21 U.S.C. section 360bbb-3(b)(1), unless the authorization is terminated or revoked sooner. Performed at [facility], [place] [address] [place], [place] [address]   Surgical pcr screen     Status: None   Collection Time: 02/04/19  3:49 AM   Specimen: Nasal Mucosa; Nasal Swab  Result Value Ref Range Status   MRSA, PCR NEGATIVE NEGATIVE Final   Staphylococcus aureus NEGATIVE NEGATIVE Final    Comment: (NOTE) The Xpert SA Assay (FDA approved for NASAL specimens in patients 47 years of age and older), is one component of a comprehensive surveillance program. It is not intended to diagnose infection nor to guide or monitor treatment. Performed at [facility], [place] [address] [place], [place] [address]   Urine Culture     Status: Abnormal (Preliminary result)   Collection Time: 02/04/19  8:00 AM   Specimen: Urine, Cystoscope  Result Value Ref Range Status   Specimen Description   Final    CYSTOSCOPY Performed at [facility] [address], [place], [place] [address]    Special Requests   Final    NONE Performed at [facility], [place] [address] [place],  [address]    Culture 3,000 COLONIES/mL PROTEUS MIRABILIS (A)  Final   Report Status PENDING  Incomplete         Radiology Studies: No results found.      Scheduled Meds: . acidophilus  1 capsule Oral Daily  . aspirin  81 mg Oral Daily  . atenolol  25 mg Oral QHS   And  . chlorthalidone  12.5 mg Oral QHS  . atorvastatin  10 mg Oral q1800  . Chlorhexidine Gluconate Cloth  6 each Topical Q0600  . cholecalciferol   2,000 Units Oral Daily  . divalproex  250 mg Oral BID  . heparin  5,000 Units Subcutaneous Q8H  . Melatonin  3 mg Oral QHS  . memantine  10 mg Oral BID  . polyethylene glycol  17 g Oral Daily  . potassium chloride  40 mEq Oral TID  . risperiDONE  0.5 mg Oral BID  . sertraline  100 mg Oral Daily   Continuous Infusions: . cefTRIAXone (ROCEPHIN)  IV 2 g (02/05/19 2300)     LOS: 2 days    Time spent: 25 mins.More than 50% of that time was spent in counseling and/or coordination of care.      Shelly Coss, MD [facility] Pager [phone]  If 7PM-7AM, please contact night-coverage [email or web address] Password [email or web address] 02/06/2019, 1:49 PM

## 2019-02-06 NOTE — NC FL2 (Signed)
[place] LEVEL OF CARE SCREENING TOOL     IDENTIFICATION  Patient Name: Lucas Vega Birthdate: [birth year] Sex: male Admission Date (Current Location): 02/03/2019  [facility] and [place] Number:  Herbalist and Address:  [facility],  [place] [address] [place]      Provider Number: [id number]  Attending Physician Name and Address:  Shelly Coss, MD  Relative Name and Phone Number:       Current Level of Care: Hospital Recommended Level of Care: Other (Comment), Nursing Facility Prior Approval Number:    Date Approved/Denied:   PASRR Number:    Discharge Plan: Other (Comment)(LTC)    Current Diagnoses: Patient Active Problem List   Diagnosis Date Noted  . Bacteremia due to Gram-negative bacteria 02/05/2019  . Bilateral nephrolithiasis 02/04/2019  . Sepsis secondary to UTI ([place]) 02/04/2019  . Hypokalemia 02/04/2019  . ARF (acute renal failure) ([place]) 02/04/2019  . Alzheimer disease ([place]) 02/04/2019  . Sepsis ([place]) 02/04/2019  . Myelopathy ([place]) 12/13/2017  . Gait abnormality 10/28/2017  . Left foot drop 10/28/2017  . Convulsions/seizures ([place]) 07/05/2014  . Right shoulder pain 07/05/2014  . Memory disturbance 01/26/2013    Orientation RESPIRATION BLADDER Height & Weight        Normal Indwelling catheter Weight: 81.6 kg Height:  5\' 9"  (175.3 cm)  BEHAVIORAL SYMPTOMS/MOOD NEUROLOGICAL BOWEL NUTRITION STATUS      Incontinent Diet(Regular)  AMBULATORY STATUS COMMUNICATION OF NEEDS Skin   Limited Assist Verbally Normal                       Personal Care Assistance Level of Assistance  Bathing, Feeding, Dressing Bathing Assistance: Maximum assistance Feeding assistance: Maximum assistance Dressing Assistance: Maximum assistance     Functional Limitations Info  Sight, Hearing Sight Info: Impaired(eyeglasses) Hearing Info: Adequate      SPECIAL CARE FACTORS FREQUENCY                        Contractures Contractures Info: Not present    Additional Factors Info  Code Status Code Status Info: DNR             Current Medications (02/06/2019):  This is the current hospital active medication list Current Facility-Administered Medications  Medication Dose Route Frequency Provider Last Rate Last Dose  . acetaminophen (TYLENOL) tablet 650 mg  650 mg Oral Q6H PRN Elwyn Reach, MD       Or  . acetaminophen (TYLENOL) suppository 650 mg  650 mg Rectal Q6H PRN Elwyn Reach, MD   650 mg at 02/04/19 1310  . acidophilus (RISAQUAD) capsule 1 capsule  1 capsule Oral Daily Barb Merino, MD   1 capsule at 02/06/19 0920  . ALPRAZolam Duanne Moron) tablet 0.25 mg  0.25 mg Oral TID PRN Barb Merino, MD   0.25 mg at 02/06/19 1504  . aspirin chewable tablet 81 mg  81 mg Oral Daily Barb Merino, MD   81 mg at 02/06/19 0920  . atenolol (TENORMIN) tablet 25 mg  25 mg Oral QHS Barb Merino, MD       And  . chlorthalidone (HYGROTON) tablet 12.5 mg  12.5 mg Oral QHS Ghimire, Dante Gang, MD      . atorvastatin (LIPITOR) tablet 10 mg  10 mg Oral q1800 Barb Merino, MD   10 mg at 02/05/19 1641  . cefTRIAXone (ROCEPHIN) 2 g in sodium chloride 0.9 % 100 mL IVPB  2  g Intravenous Q0600 Polly Cobia, RPH 200 mL/hr at 02/05/19 2300 2 g at 02/05/19 2300  . Chlorhexidine Gluconate Cloth 2 % PADS 6 each  6 each Topical Q0600 Elwyn Reach, MD   6 each at 02/05/19 [phone]  . cholecalciferol (VITAMIN D3) tablet 2,000 Units  2,000 Units Oral Daily Barb Merino, MD   2,000 Units at 02/06/19 [phone]  . divalproex (DEPAKOTE) DR tablet 250 mg  250 mg Oral BID Barb Merino, MD   250 mg at 02/06/19 0922  . heparin injection 5,000 Units  5,000 Units Subcutaneous Q8H Elwyn Reach, MD   5,000 Units at 02/06/19 [id number]  . Melatonin TABS 3 mg  3 mg Oral QHS Barb Merino, MD   3 mg at 02/05/19 2207  . memantine (NAMENDA) tablet 10 mg  10 mg Oral BID Barb Merino, MD   10 mg at 02/06/19 [id number]  . morphine  2 MG/ML injection 1 mg  1 mg Intravenous Q4H PRN Gala Romney L, MD      . ondansetron (ZOFRAN) tablet 4 mg  4 mg Oral Q6H PRN Elwyn Reach, MD       Or  . ondansetron (ZOFRAN) injection 4 mg  4 mg Intravenous Q6H PRN Elwyn Reach, MD   4 mg at 02/04/19 0737  . polyethylene glycol (MIRALAX / GLYCOLAX) packet 17 g  17 g Oral Daily Barb Merino, MD   17 g at 02/06/19 0923  . potassium chloride SA (KLOR-CON) CR tablet 40 mEq  40 mEq Oral TID Shelly Coss, MD   40 mEq at 02/06/19 1504  . risperiDONE (RISPERDAL) tablet 0.5 mg  0.5 mg Oral BID Barb Merino, MD   0.5 mg at 02/06/19 0923  . sertraline (ZOLOFT) tablet 100 mg  100 mg Oral Daily Barb Merino, MD   100 mg at 02/06/19 [id number]     Discharge Medications: Please see discharge summary for a list of discharge medications.  Relevant Imaging Results:  Relevant Lab Results:   Additional Information SS#[id number]  Mckena Chern, Juliann Pulse, [place]

## 2019-02-07 LAB — BASIC METABOLIC PANEL
Anion gap: 9 (ref 5–15)
BUN: 20 mg/dL (ref 8–23)
CO2: 21 mmol/L — ABNORMAL LOW (ref 22–32)
Calcium: 8.3 mg/dL — ABNORMAL LOW (ref 8.9–10.3)
Chloride: 113 mmol/L — ABNORMAL HIGH (ref 98–111)
Creatinine, Ser: 0.93 mg/dL (ref 0.61–1.24)
GFR calc Af Amer: >60 mL/min (ref >60)
GFR calc non Af Amer: >60 mL/min (ref >60)
Glucose, Bld: 154 mg/dL — ABNORMAL HIGH (ref 70–99)
Potassium: 3.6 mmol/L (ref 3.5–5.1)
Sodium: 143 mmol/L (ref 135–145)

## 2019-02-07 LAB — URINE CULTURE: Culture: 3000 — AB

## 2019-02-07 MED ORDER — AMOXICILLIN 250 MG PO CAPS: 1000.0000 mg | ORAL_CAPSULE | Freq: Three times a day (TID) | ORAL | Status: DC

## 2019-02-07 MED ORDER — AMOXICILLIN 500 MG PO CAPS: 1000.0000 mg | ORAL_CAPSULE | Freq: Three times a day (TID) | ORAL | Status: AC

## 2019-02-07 NOTE — Progress Notes (Signed)
Report called to Morene Crocker, nurse at [facility].  Patient's IV removed.  Site WNL.  Foley in place.  Patient awaiting transport to facility - room 603-A.

## 2019-02-07 NOTE — Care Management Important Message (Signed)
Important Message  Patient Details IM Letter given to Dessa Phi RN to present to the Patient Name: Lucas Vega MRN: [id number] Date of Birth: 1950/07/06   Medicare Important Message Given:  Yes     Kerin Salen 02/07/2019, 12:02 PM

## 2019-02-07 NOTE — Discharge Summary (Addendum)
Physician Discharge Summary  Lucas Vega [id number] DOB: [birth year] DOA: 02/03/2019  PCP: Mayra Neer, MD  Admit date: 02/03/2019 Discharge date: 02/07/2019  Admitted From: SNF Disposition:  SNF  Discharge Condition:Stable CODE STATUS:DNR Diet recommendation: Heart Healthy  Brief/Interim Summary:  Patient is a 68 year old male with history of advanced dementia, currently on long-term nursing home, total dependent on activities, who was found to be quite slightly drowsy in the nursing home.  In the emergency department he was found to have urinary tract infection and sepsis with obstructed right UPJ stone.  Urology consulted and he underwent cystoscopy, right ureteral stent placement, urethral dilation and Foley catheter placement.  Blood culture/urine culture showed pansensitive Proteus.  He was treated with IV antibiotics.  He remains hemodynamically stable.  Antibiotics changed to oral .Plan for discharge today  back to skilled nursing facility.  He needs to follow-up with urology in a week.   Following problems were addressed during his hospitalization:  Gram-negative bacteremia/sepsis present on admission: Blood culture/urine culture showed Proteus.  Source is the infected stone on the right ureter.  Underwent cystoscopy, right ureteral stent placement, urethral dilation and Foley catheter placement by urology.  He will be discharged on Foley catheter.  Follow-up with urology as an outpatient.  Will change antibiotics to oral .  Hypokalemia: Continue supplementation  AKI: Improved with IV fluids.  Advanced dementia: Long-term nursing care facility resident.      On risperidone, sertraline, memantine.   Discharge Diagnoses:  Principal Problem:   Bacteremia due to Gram-negative bacteria Active Problems:   Gait abnormality   Left foot drop   Bilateral nephrolithiasis   Sepsis secondary to UTI ([place])   Hypokalemia   ARF (acute renal failure) ([place])   Alzheimer  disease ([place])   Sepsis ([place])    Discharge Instructions  Discharge Instructions    Diet - low sodium heart healthy   Complete by: As directed    Discharge instructions   Complete by: As directed    1) Follow up with urology as an outpatient in a week.  Foley will be removed as per urology.  Name and number the provider has been attached. 2)Take prescribed medications as instructed.   Increase activity slowly   Complete by: As directed      Allergies as of 02/07/2019   No Known Allergies     Medication List    TAKE these medications   Acidophilus Caps capsule Take 1 capsule by mouth 2 (two) times daily.   allopurinol 100 MG tablet Commonly known as: ZYLOPRIM Take 100 mg by mouth daily.   ALPRAZolam 0.25 MG tablet Commonly known as: XANAX Take 0.25 mg by mouth 3 (three) times daily as needed for anxiety.   ALPRAZolam 0.5 MG tablet Commonly known as: XANAX Take 0.5 mg by mouth 3 (three) times daily.   amoxicillin 500 MG capsule Commonly known as: AMOXIL Take 2 capsules (1,000 mg total) by mouth every 8 (eight) hours for 10 days. Start taking on: February 08, 2019   aspirin 81 MG tablet Take 81 mg by mouth daily.   atenolol-chlorthalidone 50-25 MG tablet Commonly known as: TENORETIC Take 0.5 tablets by mouth at bedtime.   atorvastatin 10 MG tablet Commonly known as: LIPITOR Take 10 mg by mouth daily.   Cholecalciferol 50 MCG (2000 UT) Tabs Take 1 tablet by mouth daily.   divalproex 250 MG DR tablet Commonly known as: DEPAKOTE Take 250 mg by mouth 2 (two) times daily.   HYDROcodone-acetaminophen 5-325 MG  tablet Commonly known as: NORCO/VICODIN Take 1 tablet by mouth every 6 (six) hours as needed for severe pain. What changed: when to take this   Klor-Con M20 20 MEQ tablet Generic drug: potassium chloride SA Take 20 mEq by mouth 2 (two) times daily.   megestrol 40 MG tablet Commonly known as: MEGACE Take 40 mg by mouth daily.   Melatonin 3 MG  Caps Take 1 capsule by mouth at bedtime.   memantine 10 MG tablet Commonly known as: Namenda Take 1 tablet (10 mg total) by mouth 2 (two) times daily.   polyethylene glycol 17 g packet Commonly known as: MIRALAX / GLYCOLAX Take 17 g by mouth daily.   risperiDONE 0.5 MG tablet Commonly known as: RISPERDAL Take 0.5 mg by mouth 2 (two) times daily.   sertraline 100 MG tablet Commonly known as: ZOLOFT Take 100 mg by mouth daily.   traMADol 50 MG tablet Commonly known as: ULTRAM Take 50 mg by mouth 3 (three) times daily.      Follow-up Information    Lucas Mallow, MD. Schedule an appointment as soon as possible for a visit in 1 week(s).   Specialty: Urology Contact information: [place] [address] [phone]          No Known Allergies  Consultations:  Urology   Procedures/Studies: Ct Abdomen Pelvis Wo Contrast  Result Date: 02/04/2019 CLINICAL DATA:  Abdominal pain, fever, abscess suspected EXAM: CT ABDOMEN AND PELVIS WITHOUT CONTRAST TECHNIQUE: Multidetector CT imaging of the abdomen and pelvis was performed following the standard protocol without IV contrast. COMPARISON:  None. FINDINGS: Lower chest: Lung bases are clear. Normal heart size. No pericardial effusion. Hepatobiliary: No focal liver abnormality is seen. No gallstones, gallbladder wall thickening, or biliary dilatation. Pancreas: Diffuse atrophy. No pancreatic ductal dilatation or surrounding inflammatory changes. Spleen: Normal in size without focal abnormality. Splenic vascular calcifications of the hilum. Adrenals/Urinary Tract: Normal adrenal glands. No visible or contour deforming renal lesions. Asymmetric enlargement of the right kidney with mild hydronephrosis and a elongated calculus measuring up to 6 mm in maximal diameter in the UPJ. There is asymmetrically increased right perinephric stranding. Mild distal right ureterectasis is noted as well with some faint periureteral  stranding as well. Nonobstructing calculus present in the lower pole left kidney. No left hydronephrosis. No bladder calculi. Urinary bladder is largely decompressed at the time of exam and therefore poorly evaluated by CT imaging. Stomach/Bowel: Distal esophagus, stomach and duodenal sweep are unremarkable. No small bowel wall thickening or dilatation. No evidence of obstruction. A normal appendix is visualized. No colonic dilatation or wall thickening. Scattered colonic diverticula without focal pericolonic inflammation to suggest diverticulitis. Fecal material in the gluteal cleft. Vascular/Lymphatic: Extensive atherosclerotic calcification of the aorta and branch vessels. Some reactive retroperitoneal adenopathy. No pathologically enlarged nodes seen in the abdomen or pelvis. Reproductive: The prostate and seminal vesicles are unremarkable. Other: Mild body wall edema. Soft tissue stranding of the lateral hips. Postsurgical changes in the right inguinal region, correlate for prior coronal hernia repair. Musculoskeletal: No acute osseous abnormality or suspicious osseous lesion. Multilevel degenerative changes are present in the imaged portions of the spine. Additional degenerative changes in hips and pelvis. IMPRESSION: 1. Obstructing right UPJ calculus measuring up to 6 mm in maximal diameter. Mild resulting hydronephrosis. 2. More distal right ureterectasis and periureteral stranding could reflect a an additional recently passed calculus or potential ascending urinary tract infection. Correlate with urinalysis. 3. Additional nonobstructing calculus in the  lower pole of the left kidney. 4. Colonic diverticulosis without evidence of diverticulitis. 5. Surgical clips in the right inguinal region, correlate for history of inguinal hernia repair. 6. Aortic Atherosclerosis (ICD10-I70.0). Electronically Signed   By: Lovena Le M.D.   On: 02/04/2019 01:02   Dg Chest Port 1 View  Result Date: 02/03/2019 CLINICAL  DATA:  Lethargy and fever. EXAM: PORTABLE CHEST 1 VIEW COMPARISON:  December 29, 2017 FINDINGS: The heart size and mediastinal contours are within normal limits. Both lungs are clear. The visualized skeletal structures are unremarkable. IMPRESSION: No active disease. Electronically Signed   By: Dorise Bullion III M.D   On: 02/03/2019 21:33   Dg C-arm 1-60 Min-no Report  Result Date: 02/04/2019 Fluoroscopy was utilized by the requesting physician.  No radiographic interpretation.       Subjective: Patient seen and examined the bedside this morning.  Hemodynamically stable.  Medically stable for discharge.  Discharge Exam: Vitals:   02/06/19 2054 02/07/19 0502  BP: (!) 141/77 (!) 142/74  Pulse: 66 62  Resp: 16 18  Temp: (!) 97.5 F (36.4 C) 98.2 F (36.8 C)  SpO2: 99% 100%   Vitals:   02/06/19 0740 02/06/19 1500 02/06/19 2054 02/07/19 0502  BP: 120/72 120/78 (!) 141/77 (!) 142/74  Pulse: 61 64 66 62  Resp: 18 20 16 18   Temp: 97.9 F (36.6 C) 97.7 F (36.5 C) (!) 97.5 F (36.4 C) 98.2 F (36.8 C)  TempSrc: Oral Oral    SpO2: 98% 98% 99% 100%  Weight:      Height:        General: Pt is alert, awake, not in acute distress Cardiovascular: RRR, S1/S2 +, no rubs, no gallops Respiratory: CTA bilaterally, no wheezing, no rhonchi Abdominal: Soft, NT, ND, bowel sounds + Extremities: no edema, no cyanosis    The results of significant diagnostics from this hospitalization (including imaging, microbiology, ancillary and laboratory) are listed below for reference.     Microbiology: Recent Results (from the past 240 hour(s))  Blood Culture (routine x 2)     Status: Abnormal   Collection Time: 02/03/19 10:29 PM   Specimen: BLOOD LEFT FOREARM  Result Value Ref Range Status   Specimen Description   Final    BLOOD LEFT FOREARM Performed at [facility] [address] [place], [place] [address]    Special Requests   Final    BOTTLES DRAWN AEROBIC AND  ANAEROBIC Blood Culture adequate volume Performed at [facility] [address], [place], [place] [address]    Culture  Setup Time   Final    GRAM NEGATIVE RODS ANAEROBIC BOTTLE ONLY CRITICAL RESULT CALLED TO, READ BACK BY AND VERIFIED WITH: [place] [id number] FCP  Performed at [facility], [place] [address] [place], [place] [address]    Culture PROTEUS MIRABILIS (A)  Final   Report Status 02/06/2019 FINAL  Final   Organism ID, Bacteria PROTEUS MIRABILIS  Final      Susceptibility   Proteus mirabilis - MIC*    AMPICILLIN <=2 SENSITIVE Sensitive     CEFAZOLIN <=4 SENSITIVE Sensitive     CEFEPIME <=1 SENSITIVE Sensitive     CEFTAZIDIME <=1 SENSITIVE Sensitive     CEFTRIAXONE <=1 SENSITIVE Sensitive     CIPROFLOXACIN 2 INTERMEDIATE Intermediate     GENTAMICIN <=1 SENSITIVE Sensitive     IMIPENEM 2 SENSITIVE Sensitive     TRIMETH/SULFA <=20 SENSITIVE Sensitive  AMPICILLIN/SULBACTAM <=2 SENSITIVE Sensitive     PIP/TAZO <=4 SENSITIVE Sensitive     * PROTEUS MIRABILIS  Blood Culture (routine x 2)     Status: None (Preliminary result)   Collection Time: 02/03/19 10:29 PM   Specimen: BLOOD  Result Value Ref Range Status   Specimen Description   Final    BLOOD LEFT ANTECUBITAL Performed at [facility] [address] [place], [place] [address]    Special Requests   Final    BOTTLES DRAWN AEROBIC AND ANAEROBIC Blood Culture adequate volume Performed at [facility] [address] [place], [place] [address]    Culture   Final    NO GROWTH 3 DAYS Performed at [facility], [place] [address], [place], [place] [address]    Report Status PENDING  Incomplete  Blood Culture ID Panel (Reflexed)     Status: Abnormal   Collection Time: 02/03/19 10:29 PM  Result Value Ref Range Status   Enterococcus species NOT DETECTED NOT DETECTED Final   Listeria monocytogenes NOT DETECTED NOT DETECTED Final    Staphylococcus species NOT DETECTED NOT DETECTED Final   Staphylococcus aureus (BCID) NOT DETECTED NOT DETECTED Final   Streptococcus species NOT DETECTED NOT DETECTED Final   Streptococcus agalactiae NOT DETECTED NOT DETECTED Final   Streptococcus pneumoniae NOT DETECTED NOT DETECTED Final   Streptococcus pyogenes NOT DETECTED NOT DETECTED Final   Acinetobacter baumannii NOT DETECTED NOT DETECTED Final   Enterobacteriaceae species DETECTED (A) NOT DETECTED Final    Comment: Enterobacteriaceae represent a large family of gram-negative bacteria, not a single organism. CRITICAL RESULT CALLED TO, READ BACK BY AND VERIFIED WITH: PHARMD DREW W. [id number] FCP     Enterobacter cloacae complex NOT DETECTED NOT DETECTED Final   Escherichia coli NOT DETECTED NOT DETECTED Final   Klebsiella oxytoca NOT DETECTED NOT DETECTED Final   Klebsiella pneumoniae NOT DETECTED NOT DETECTED Final   Proteus species DETECTED (A) NOT DETECTED Final    Comment: CRITICAL RESULT CALLED TO, READ BACK BY AND VERIFIED WITH: PHARMD DREW W. OT:[id number] FCP     Serratia marcescens NOT DETECTED NOT DETECTED Final   Carbapenem resistance NOT DETECTED NOT DETECTED Final   Haemophilus influenzae NOT DETECTED NOT DETECTED Final   Neisseria meningitidis NOT DETECTED NOT DETECTED Final   Pseudomonas aeruginosa NOT DETECTED NOT DETECTED Final   Candida albicans NOT DETECTED NOT DETECTED Final   Candida glabrata NOT DETECTED NOT DETECTED Final   Candida krusei NOT DETECTED NOT DETECTED Final   Candida parapsilosis NOT DETECTED NOT DETECTED Final   Candida tropicalis NOT DETECTED NOT DETECTED Final    Comment: Performed at [facility], [place] [address], [place], [place] [address]  Urine culture     Status: Abnormal   Collection Time: 02/04/19 12:30 AM   Specimen: In/Out Cath Urine  Result Value Ref Range Status   Specimen Description   Final    IN/OUT CATH URINE Performed at [facility]  [address] [place], [place] [address]    Special Requests   Final    NONE Performed at [facility], [place] [address], [place], [place] [address]    Culture 10,000 COLONIES/mL PROTEUS MIRABILIS (A)  Final   Report Status 02/06/2019 FINAL  Final   Organism ID, Bacteria PROTEUS MIRABILIS (A)  Final      Susceptibility   Proteus mirabilis - MIC*    AMPICILLIN <=2 SENSITIVE Sensitive  CEFAZOLIN <=4 SENSITIVE Sensitive     CEFTRIAXONE <=1 SENSITIVE Sensitive     CIPROFLOXACIN 2 INTERMEDIATE Intermediate     GENTAMICIN <=1 SENSITIVE Sensitive     IMIPENEM 4 SENSITIVE Sensitive     NITROFURANTOIN 128 RESISTANT Resistant     TRIMETH/SULFA <=20 SENSITIVE Sensitive     AMPICILLIN/SULBACTAM <=2 SENSITIVE Sensitive     PIP/TAZO <=4 SENSITIVE Sensitive     * 10,000 COLONIES/mL PROTEUS MIRABILIS  SARS Coronavirus 2 by RT PCR (hospital order, performed in [facility] hospital lab) Nasopharyngeal Nasopharyngeal Swab     Status: None   Collection Time: 02/04/19  2:27 AM   Specimen: Nasopharyngeal Swab  Result Value Ref Range Status   SARS Coronavirus 2 NEGATIVE NEGATIVE Final    Comment: (NOTE) SARS-CoV-2 target nucleic acids are NOT DETECTED. The SARS-CoV-2 RNA is generally detectable in upper and lower respiratory specimens during the acute phase of infection. The lowest concentration of SARS-CoV-2 viral copies this assay can detect is 250 copies / mL. A negative result does not preclude SARS-CoV-2 infection and should not be used as the sole basis for treatment or other patient management decisions.  A negative result may occur with improper specimen collection / handling, submission of specimen other than nasopharyngeal swab, presence of viral mutation(s) within the areas targeted by this assay, and inadequate number of viral copies (<250 copies / mL). A negative result must be combined with clinical observations, patient history, and epidemiological information. Fact  Sheet for Patients:   StrictlyIdeas.no Fact Sheet for Healthcare Providers: BankingDealers.co.za This test is not yet approved or cleared  by the [place] FDA and has been authorized for detection and/or diagnosis of SARS-CoV-2 by FDA under an Emergency Use Authorization (EUA).  This EUA will remain in effect (meaning this test can be used) for the duration of the COVID-19 declaration under Section 564(b)(1) of the Act, 21 U.S.C. section 360bbb-3(b)(1), unless the authorization is terminated or revoked sooner. Performed at [facility], [place] [address], [place], [place] [address]   Surgical pcr screen     Status: None   Collection Time: 02/04/19  3:49 AM   Specimen: Nasal Mucosa; Nasal Swab  Result Value Ref Range Status   MRSA, PCR NEGATIVE NEGATIVE Final   Staphylococcus aureus NEGATIVE NEGATIVE Final    Comment: (NOTE) The Xpert SA Assay (FDA approved for NASAL specimens in patients 64 years of age and older), is one component of a comprehensive surveillance program. It is not intended to diagnose infection nor to guide or monitor treatment. Performed at [facility], [place] [address] [place], [place] [address]   Urine Culture     Status: Abnormal   Collection Time: 02/04/19  8:00 AM   Specimen: Urine, Cystoscope  Result Value Ref Range Status   Specimen Description   Final    CYSTOSCOPY Performed at [facility] [address] [place], [place] [address]    Special Requests   Final    NONE Performed at [facility], [place] [address], [place], [place] [address]    Culture 3,000 COLONIES/mL PROTEUS MIRABILIS (A)  Final   Report Status 02/07/2019 FINAL  Final   Organism ID, Bacteria PROTEUS MIRABILIS (A)  Final      Susceptibility   Proteus mirabilis - MIC*    AMPICILLIN <=2 SENSITIVE Sensitive     CEFAZOLIN <=4 SENSITIVE Sensitive     CEFEPIME  <=1 SENSITIVE Sensitive     CEFTAZIDIME <=1  SENSITIVE Sensitive     CEFTRIAXONE <=1 SENSITIVE Sensitive     CIPROFLOXACIN 2 INTERMEDIATE Intermediate     GENTAMICIN <=1 SENSITIVE Sensitive     IMIPENEM 4 SENSITIVE Sensitive     TRIMETH/SULFA <=20 SENSITIVE Sensitive     AMPICILLIN/SULBACTAM <=2 SENSITIVE Sensitive     PIP/TAZO <=4 SENSITIVE Sensitive     * 3,000 COLONIES/mL PROTEUS MIRABILIS     Labs: BNP (last 3 results) No results for input(s): BNP in the last 8760 hours. Basic Metabolic Panel: Recent Labs  Lab 02/03/19 2228 02/04/19 0353 02/05/19 0526 02/06/19 0450 02/07/19 0437  NA 142 144 143 145 143  K 3.0* 3.0* 2.3* 3.3* 3.6  CL 105 107 108 113* 113*  CO2 23 24 26 24  21*  GLUCOSE 196* 223* 174* 141* 154*  BUN 34* 34* 34* 28* 20  CREATININE 1.95* 2.14* 1.62* 1.27* 0.93  CALCIUM 8.5* 9.0 8.3* 8.2* 8.3*  MG  --   --  2.1  --   --   PHOS  --   --  2.5  --   --    Liver Function Tests: Recent Labs  Lab 02/03/19 2228 02/04/19 0353 02/05/19 0526  AST 26 24 34  ALT 22 22 30   ALKPHOS 74 70 55  BILITOT 1.1 1.5* 0.9  PROT 7.2 7.4 6.4*  ALBUMIN 3.1* 3.2* 2.6*   No results for input(s): LIPASE, AMYLASE in the last 168 hours. No results for input(s): AMMONIA in the last 168 hours. CBC: Recent Labs  Lab 02/03/19 2228 02/04/19 0353 02/05/19 0526 02/06/19 0450  WBC 24.8* 21.5* 14.0* 9.7  NEUTROABS 21.2*  --  11.2* 7.3  HGB 10.5* 10.7* 9.1* 8.9*  HCT 33.1* 34.1* 28.7* 27.9*  MCV 92.2 95.5 93.8 92.1  PLT 261 234 203 210   Cardiac Enzymes: No results for input(s): CKTOTAL, CKMB, CKMBINDEX, TROPONINI in the last 168 hours. BNP: Invalid input(s): POCBNP CBG: No results for input(s): GLUCAP in the last 168 hours. D-Dimer No results for input(s): DDIMER in the last 72 hours. Hgb A1c No results for input(s): HGBA1C in the last 72 hours. Lipid Profile No results for input(s): CHOL, HDL, LDLCALC, TRIG, CHOLHDL, LDLDIRECT in the last 72 hours. Thyroid function  studies No results for input(s): TSH, T4TOTAL, T3FREE, THYROIDAB in the last 72 hours.  Invalid input(s): FREET3 Anemia work up No results for input(s): VITAMINB12, FOLATE, FERRITIN, TIBC, IRON, RETICCTPCT in the last 72 hours. Urinalysis    Component Value Date/Time   COLORURINE YELLOW 02/04/2019 0030   APPEARANCEUR CLOUDY (A) 02/04/2019 0030   LABSPEC 1.019 02/04/2019 0030   PHURINE 6.0 02/04/2019 0030   GLUCOSEU NEGATIVE 02/04/2019 0030   HGBUR SMALL (A) 02/04/2019 0030   BILIRUBINUR NEGATIVE 02/04/2019 0030   KETONESUR NEGATIVE 02/04/2019 0030   PROTEINUR 100 (A) 02/04/2019 0030   NITRITE NEGATIVE 02/04/2019 0030   LEUKOCYTESUR LARGE (A) 02/04/2019 0030   Sepsis Labs Invalid input(s): PROCALCITONIN,  WBC,  LACTICIDVEN Microbiology Recent Results (from the past 240 hour(s))  Blood Culture (routine x 2)     Status: Abnormal   Collection Time: 02/03/19 10:29 PM   Specimen: BLOOD LEFT FOREARM  Result Value Ref Range Status   Specimen Description   Final    BLOOD LEFT FOREARM Performed at [facility], [place] [address], [place], [place] [address]    Special Requests   Final    BOTTLES DRAWN AEROBIC AND ANAEROBIC Blood Culture adequate volume Performed at [facility]  [address] [place], [place] [address]    Culture  Setup Time   Final    GRAM NEGATIVE RODS ANAEROBIC BOTTLE ONLY CRITICAL RESULT CALLED TO, READ BACK BY AND VERIFIED WITH: [place] [id number] FCP  Performed at [facility], [place] [address] [place], [place] [address]    Culture PROTEUS MIRABILIS (A)  Final   Report Status 02/06/2019 FINAL  Final   Organism ID, Bacteria PROTEUS MIRABILIS  Final      Susceptibility   Proteus mirabilis - MIC*    AMPICILLIN <=2 SENSITIVE Sensitive     CEFAZOLIN <=4 SENSITIVE Sensitive     CEFEPIME <=1 SENSITIVE Sensitive     CEFTAZIDIME <=1 SENSITIVE Sensitive     CEFTRIAXONE <=1 SENSITIVE Sensitive     CIPROFLOXACIN 2  INTERMEDIATE Intermediate     GENTAMICIN <=1 SENSITIVE Sensitive     IMIPENEM 2 SENSITIVE Sensitive     TRIMETH/SULFA <=20 SENSITIVE Sensitive     AMPICILLIN/SULBACTAM <=2 SENSITIVE Sensitive     PIP/TAZO <=4 SENSITIVE Sensitive     * PROTEUS MIRABILIS  Blood Culture (routine x 2)     Status: None (Preliminary result)   Collection Time: 02/03/19 10:29 PM   Specimen: BLOOD  Result Value Ref Range Status   Specimen Description   Final    BLOOD LEFT ANTECUBITAL Performed at [facility] [address], [place], [place] [address]    Special Requests   Final    BOTTLES DRAWN AEROBIC AND ANAEROBIC Blood Culture adequate volume Performed at [facility] [address], [place], [place] [address]    Culture   Final    NO GROWTH 3 DAYS Performed at [facility], [place] [address], [place], [place] [address]    Report Status PENDING  Incomplete  Blood Culture ID Panel (Reflexed)     Status: Abnormal   Collection Time: 02/03/19 10:29 PM  Result Value Ref Range Status   Enterococcus species NOT DETECTED NOT DETECTED Final   Listeria monocytogenes NOT DETECTED NOT DETECTED Final   Staphylococcus species NOT DETECTED NOT DETECTED Final   Staphylococcus aureus (BCID) NOT DETECTED NOT DETECTED Final   Streptococcus species NOT DETECTED NOT DETECTED Final   Streptococcus agalactiae NOT DETECTED NOT DETECTED Final   Streptococcus pneumoniae NOT DETECTED NOT DETECTED Final   Streptococcus pyogenes NOT DETECTED NOT DETECTED Final   Acinetobacter baumannii NOT DETECTED NOT DETECTED Final   Enterobacteriaceae species DETECTED (A) NOT DETECTED Final    Comment: Enterobacteriaceae represent a large family of gram-negative bacteria, not a single organism. CRITICAL RESULT CALLED TO, READ BACK BY AND VERIFIED WITH: PHARMD DREW W. [id number] FCP     Enterobacter cloacae complex NOT DETECTED NOT DETECTED Final   Escherichia coli NOT DETECTED NOT DETECTED Final    Klebsiella oxytoca NOT DETECTED NOT DETECTED Final   Klebsiella pneumoniae NOT DETECTED NOT DETECTED Final   Proteus species DETECTED (A) NOT DETECTED Final    Comment: CRITICAL RESULT CALLED TO, READ BACK BY AND VERIFIED WITH: PHARMD DREW W. [id number] FCP     Serratia marcescens NOT DETECTED NOT DETECTED Final   Carbapenem resistance NOT DETECTED NOT DETECTED Final   Haemophilus influenzae NOT DETECTED NOT DETECTED Final   Neisseria meningitidis NOT DETECTED NOT DETECTED Final   Pseudomonas aeruginosa NOT DETECTED NOT DETECTED Final   Candida albicans NOT DETECTED NOT DETECTED Final   Candida glabrata NOT DETECTED NOT DETECTED Final   Candida krusei NOT DETECTED  NOT DETECTED Final   Candida parapsilosis NOT DETECTED NOT DETECTED Final   Candida tropicalis NOT DETECTED NOT DETECTED Final    Comment: Performed at [facility], [place] [address], [place], [place] [address]  Urine culture     Status: Abnormal   Collection Time: 02/04/19 12:30 AM   Specimen: In/Out Cath Urine  Result Value Ref Range Status   Specimen Description   Final    IN/OUT CATH URINE Performed at [facility] [address], [place], [place] [address]    Special Requests   Final    NONE Performed at [facility], [place] [address] [place], [place] [id number]    Culture 10,000 COLONIES/mL PROTEUS MIRABILIS (A)  Final   Report Status 02/06/2019 FINAL  Final   Organism ID, Bacteria PROTEUS MIRABILIS (A)  Final      Susceptibility   Proteus mirabilis - MIC*    AMPICILLIN <=2 SENSITIVE Sensitive     CEFAZOLIN <=4 SENSITIVE Sensitive     CEFTRIAXONE <=1 SENSITIVE Sensitive     CIPROFLOXACIN 2 INTERMEDIATE Intermediate     GENTAMICIN <=1 SENSITIVE Sensitive     IMIPENEM 4 SENSITIVE Sensitive     NITROFURANTOIN 128 RESISTANT Resistant     TRIMETH/SULFA <=20 SENSITIVE Sensitive     AMPICILLIN/SULBACTAM <=2 SENSITIVE Sensitive     PIP/TAZO <=4 SENSITIVE Sensitive     *  10,000 COLONIES/mL PROTEUS MIRABILIS  SARS Coronavirus 2 by RT PCR (hospital order, performed in [place] hospital lab) Nasopharyngeal Nasopharyngeal Swab     Status: None   Collection Time: 02/04/19  2:27 AM   Specimen: Nasopharyngeal Swab  Result Value Ref Range Status   SARS Coronavirus 2 NEGATIVE NEGATIVE Final    Comment: (NOTE) SARS-CoV-2 target nucleic acids are NOT DETECTED. The SARS-CoV-2 RNA is generally detectable in upper and lower respiratory specimens during the acute phase of infection. The lowest concentration of SARS-CoV-2 viral copies this assay can detect is 250 copies / mL. A negative result does not preclude SARS-CoV-2 infection and should not be used as the sole basis for treatment or other patient management decisions.  A negative result may occur with improper specimen collection / handling, submission of specimen other than nasopharyngeal swab, presence of viral mutation(s) within the areas targeted by this assay, and inadequate number of viral copies (<250 copies / mL). A negative result must be combined with clinical observations, patient history, and epidemiological information. Fact Sheet for Patients:   StrictlyIdeas.no Fact Sheet for Healthcare Providers: BankingDealers.co.za This test is not yet approved or cleared  by the [place] FDA and has been authorized for detection and/or diagnosis of SARS-CoV-2 by FDA under an Emergency Use Authorization (EUA).  This EUA will remain in effect (meaning this test can be used) for the duration of the COVID-19 declaration under Section 564(b)(1) of the Act, 21 U.S.C. section 360bbb-3(b)(1), unless the authorization is terminated or revoked sooner. Performed at [facility], [place] [address], [place], [place] [address]   Surgical pcr screen     Status: None   Collection Time: 02/04/19  3:49 AM   Specimen: Nasal Mucosa; Nasal Swab  Result  Value Ref Range Status   MRSA, PCR NEGATIVE NEGATIVE Final   Staphylococcus aureus NEGATIVE NEGATIVE Final    Comment: (NOTE) The Xpert SA Assay (FDA approved for NASAL specimens in patients 7 years of age and older), is one component of a comprehensive surveillance program. It is not intended to  diagnose infection nor to guide or monitor treatment. Performed at [facility], [place] [address] [place], [place] [address]   Urine Culture     Status: Abnormal   Collection Time: 02/04/19  8:00 AM   Specimen: Urine, Cystoscope  Result Value Ref Range Status   Specimen Description   Final    CYSTOSCOPY Performed at [place] [address] [place], [place] [address]    Special Requests   Final    NONE Performed at [facility], [place] [address], [place], [place] [address]    Culture 3,000 COLONIES/mL PROTEUS MIRABILIS (A)  Final   Report Status 02/07/2019 FINAL  Final   Organism ID, Bacteria PROTEUS MIRABILIS (A)  Final      Susceptibility   Proteus mirabilis - MIC*    AMPICILLIN <=2 SENSITIVE Sensitive     CEFAZOLIN <=4 SENSITIVE Sensitive     CEFEPIME <=1 SENSITIVE Sensitive     CEFTAZIDIME <=1 SENSITIVE Sensitive     CEFTRIAXONE <=1 SENSITIVE Sensitive     CIPROFLOXACIN 2 INTERMEDIATE Intermediate     GENTAMICIN <=1 SENSITIVE Sensitive     IMIPENEM 4 SENSITIVE Sensitive     TRIMETH/SULFA <=20 SENSITIVE Sensitive     AMPICILLIN/SULBACTAM <=2 SENSITIVE Sensitive     PIP/TAZO <=4 SENSITIVE Sensitive     * 3,000 COLONIES/mL PROTEUS MIRABILIS    Please note: You were cared for by a hospitalist during your hospital stay. Once you are discharged, your primary care physician will handle any further medical issues. Please note that NO REFILLS for any discharge medications will be authorized once you are discharged, as it is imperative that you return to your primary care physician (or establish a relationship with a primary  care physician if you do not have one) for your post hospital discharge needs so that they can reassess your need for medications and monitor your lab values.    Time coordinating discharge: 40 minutes  SIGNED:   Shelly Coss, MD  [facility] 02/07/2019, 11:03 AM Pager [phone]  If 7PM-7AM, please contact night-coverage [email or web address] Password [email or web address]

## 2019-02-07 NOTE — TOC Transition Note (Signed)
Transition of Care [facility]) - CM/SW Discharge Note   Patient Details  Name: Lucas Vega MRN: [id number] Date of Birth: 24-Apr-1950  Transition of Care [facility]) CM/SW Contact:  Dessa Phi, RN Phone Number: 02/07/2019, 1:06 PM   Clinical Narrative: d/c to [place] rep Claiborne Billings able to receive back-going to rm 603A,nurse call report tel#[phone] called for 2p pick up. Nsg aware. No further CM needs.      Final next level of care: Skilled Nursing Facility Barriers to Discharge: No Barriers Identified   Patient Goals and CMS Choice        Discharge Placement   Existing PASRR number confirmed : 02/07/19          Patient chooses bed at: [facility] Patient to be transferred to facility by: [facility] Name of family member notified: Lysbeth Galas spouse [id number] Patient and family notified of of transfer: 02/07/19  Discharge Plan and Services   Discharge Planning Services: CM Consult                                 Social Determinants of Health ([place]) Interventions     Readmission Risk Interventions Readmission Risk Prevention Plan 02/06/2019  PCP or Specialist Appt within 3-5 Days Complete  HRI or [place] Complete  Social Work Consult for [place] Planning/Counseling Complete  Palliative Care Screening Not Applicable  Medication Review Press photographer) Complete  Some recent data might be hidden

## 2019-02-08 DIAGNOSIS — F0390 Unspecified dementia without behavioral disturbance: Secondary | ICD-10-CM | POA: Diagnosis not present

## 2019-02-08 DIAGNOSIS — E876 Hypokalemia: Secondary | ICD-10-CM | POA: Diagnosis not present

## 2019-02-08 DIAGNOSIS — N179 Acute kidney failure, unspecified: Secondary | ICD-10-CM | POA: Diagnosis not present

## 2019-02-09 LAB — CULTURE, BLOOD (ROUTINE X 2)
Culture: NO GROWTH
Special Requests: ADEQUATE

## 2019-02-10 DIAGNOSIS — G4701 Insomnia due to medical condition: Secondary | ICD-10-CM | POA: Diagnosis not present

## 2019-02-10 DIAGNOSIS — F0390 Unspecified dementia without behavioral disturbance: Secondary | ICD-10-CM | POA: Diagnosis not present

## 2019-02-10 DIAGNOSIS — R071 Chest pain on breathing: Secondary | ICD-10-CM | POA: Diagnosis not present

## 2019-02-10 DIAGNOSIS — D649 Anemia, unspecified: Secondary | ICD-10-CM | POA: Diagnosis not present

## 2019-02-10 DIAGNOSIS — G8929 Other chronic pain: Secondary | ICD-10-CM | POA: Diagnosis not present

## 2019-02-10 DIAGNOSIS — D638 Anemia in other chronic diseases classified elsewhere: Secondary | ICD-10-CM | POA: Diagnosis not present

## 2019-02-10 DIAGNOSIS — F0391 Unspecified dementia with behavioral disturbance: Secondary | ICD-10-CM | POA: Diagnosis not present

## 2019-02-10 DIAGNOSIS — E876 Hypokalemia: Secondary | ICD-10-CM | POA: Diagnosis not present

## 2019-02-10 DIAGNOSIS — N179 Acute kidney failure, unspecified: Secondary | ICD-10-CM | POA: Diagnosis not present

## 2019-02-10 DIAGNOSIS — R54 Age-related physical debility: Secondary | ICD-10-CM | POA: Diagnosis not present

## 2019-02-10 DIAGNOSIS — N200 Calculus of kidney: Secondary | ICD-10-CM | POA: Diagnosis not present

## 2019-02-10 DIAGNOSIS — M25531 Pain in right wrist: Secondary | ICD-10-CM | POA: Diagnosis not present

## 2019-02-10 DIAGNOSIS — E559 Vitamin D deficiency, unspecified: Secondary | ICD-10-CM | POA: Diagnosis not present

## 2019-02-10 DIAGNOSIS — F064 Anxiety disorder due to known physiological condition: Secondary | ICD-10-CM | POA: Diagnosis not present

## 2019-02-10 DIAGNOSIS — I10 Essential (primary) hypertension: Secondary | ICD-10-CM | POA: Diagnosis not present

## 2019-02-14 DIAGNOSIS — M6281 Muscle weakness (generalized): Secondary | ICD-10-CM | POA: Diagnosis not present

## 2019-02-14 DIAGNOSIS — N200 Calculus of kidney: Secondary | ICD-10-CM | POA: Diagnosis not present

## 2019-02-14 DIAGNOSIS — M1000 Idiopathic gout, unspecified site: Secondary | ICD-10-CM | POA: Diagnosis not present

## 2019-02-14 DIAGNOSIS — R7881 Bacteremia: Secondary | ICD-10-CM | POA: Diagnosis not present

## 2019-02-14 DIAGNOSIS — E559 Vitamin D deficiency, unspecified: Secondary | ICD-10-CM | POA: Diagnosis not present

## 2019-02-14 DIAGNOSIS — N138 Other obstructive and reflux uropathy: Secondary | ICD-10-CM | POA: Diagnosis not present

## 2019-02-14 DIAGNOSIS — E785 Hyperlipidemia, unspecified: Secondary | ICD-10-CM | POA: Diagnosis not present

## 2019-02-14 DIAGNOSIS — G8929 Other chronic pain: Secondary | ICD-10-CM | POA: Diagnosis not present

## 2019-02-14 DIAGNOSIS — R1312 Dysphagia, oropharyngeal phase: Secondary | ICD-10-CM | POA: Diagnosis not present

## 2019-02-14 DIAGNOSIS — I10 Essential (primary) hypertension: Secondary | ICD-10-CM | POA: Diagnosis not present

## 2019-02-14 DIAGNOSIS — N390 Urinary tract infection, site not specified: Secondary | ICD-10-CM | POA: Diagnosis not present

## 2019-02-14 DIAGNOSIS — B964 Proteus (mirabilis) (morganii) as the cause of diseases classified elsewhere: Secondary | ICD-10-CM | POA: Diagnosis not present

## 2019-02-14 DIAGNOSIS — F0391 Unspecified dementia with behavioral disturbance: Secondary | ICD-10-CM | POA: Diagnosis not present

## 2019-02-14 DIAGNOSIS — E876 Hypokalemia: Secondary | ICD-10-CM | POA: Diagnosis not present

## 2019-02-15 DIAGNOSIS — G8929 Other chronic pain: Secondary | ICD-10-CM | POA: Diagnosis not present

## 2019-02-15 DIAGNOSIS — F064 Anxiety disorder due to known physiological condition: Secondary | ICD-10-CM | POA: Diagnosis not present

## 2019-02-15 DIAGNOSIS — F0391 Unspecified dementia with behavioral disturbance: Secondary | ICD-10-CM | POA: Diagnosis not present

## 2019-02-15 DIAGNOSIS — N179 Acute kidney failure, unspecified: Secondary | ICD-10-CM | POA: Diagnosis not present

## 2019-02-15 DIAGNOSIS — R54 Age-related physical debility: Secondary | ICD-10-CM | POA: Diagnosis not present

## 2019-02-15 DIAGNOSIS — M25531 Pain in right wrist: Secondary | ICD-10-CM | POA: Diagnosis not present

## 2019-02-15 DIAGNOSIS — I10 Essential (primary) hypertension: Secondary | ICD-10-CM | POA: Diagnosis not present

## 2019-02-15 DIAGNOSIS — E876 Hypokalemia: Secondary | ICD-10-CM | POA: Diagnosis not present

## 2019-02-15 DIAGNOSIS — D638 Anemia in other chronic diseases classified elsewhere: Secondary | ICD-10-CM | POA: Diagnosis not present

## 2019-02-15 DIAGNOSIS — F0390 Unspecified dementia without behavioral disturbance: Secondary | ICD-10-CM | POA: Diagnosis not present

## 2019-02-15 DIAGNOSIS — E559 Vitamin D deficiency, unspecified: Secondary | ICD-10-CM | POA: Diagnosis not present

## 2019-02-15 DIAGNOSIS — G4701 Insomnia due to medical condition: Secondary | ICD-10-CM | POA: Diagnosis not present

## 2019-02-16 ENCOUNTER — Other Ambulatory Visit: Payer: Self-pay | Admitting: Urology

## 2019-02-16 DIAGNOSIS — Z1159 Encounter for screening for other viral diseases: Secondary | ICD-10-CM | POA: Diagnosis not present

## 2019-02-17 DIAGNOSIS — M25531 Pain in right wrist: Secondary | ICD-10-CM | POA: Diagnosis not present

## 2019-02-17 DIAGNOSIS — G8929 Other chronic pain: Secondary | ICD-10-CM | POA: Diagnosis not present

## 2019-02-17 DIAGNOSIS — I10 Essential (primary) hypertension: Secondary | ICD-10-CM | POA: Diagnosis not present

## 2019-02-17 DIAGNOSIS — F064 Anxiety disorder due to known physiological condition: Secondary | ICD-10-CM | POA: Diagnosis not present

## 2019-02-17 DIAGNOSIS — E559 Vitamin D deficiency, unspecified: Secondary | ICD-10-CM | POA: Diagnosis not present

## 2019-02-17 DIAGNOSIS — N179 Acute kidney failure, unspecified: Secondary | ICD-10-CM | POA: Diagnosis not present

## 2019-02-17 DIAGNOSIS — G4701 Insomnia due to medical condition: Secondary | ICD-10-CM | POA: Diagnosis not present

## 2019-02-17 DIAGNOSIS — E876 Hypokalemia: Secondary | ICD-10-CM | POA: Diagnosis not present

## 2019-02-17 DIAGNOSIS — R54 Age-related physical debility: Secondary | ICD-10-CM | POA: Diagnosis not present

## 2019-02-17 DIAGNOSIS — F0391 Unspecified dementia with behavioral disturbance: Secondary | ICD-10-CM | POA: Diagnosis not present

## 2019-02-17 DIAGNOSIS — F0390 Unspecified dementia without behavioral disturbance: Secondary | ICD-10-CM | POA: Diagnosis not present

## 2019-02-17 DIAGNOSIS — D638 Anemia in other chronic diseases classified elsewhere: Secondary | ICD-10-CM | POA: Diagnosis not present

## 2019-02-20 DIAGNOSIS — G4701 Insomnia due to medical condition: Secondary | ICD-10-CM | POA: Diagnosis not present

## 2019-02-20 DIAGNOSIS — I10 Essential (primary) hypertension: Secondary | ICD-10-CM | POA: Diagnosis not present

## 2019-02-20 DIAGNOSIS — R54 Age-related physical debility: Secondary | ICD-10-CM | POA: Diagnosis not present

## 2019-02-20 DIAGNOSIS — E876 Hypokalemia: Secondary | ICD-10-CM | POA: Diagnosis not present

## 2019-02-20 DIAGNOSIS — D638 Anemia in other chronic diseases classified elsewhere: Secondary | ICD-10-CM | POA: Diagnosis not present

## 2019-02-20 DIAGNOSIS — Z1159 Encounter for screening for other viral diseases: Secondary | ICD-10-CM | POA: Diagnosis not present

## 2019-02-20 DIAGNOSIS — E559 Vitamin D deficiency, unspecified: Secondary | ICD-10-CM | POA: Diagnosis not present

## 2019-02-20 DIAGNOSIS — G8929 Other chronic pain: Secondary | ICD-10-CM | POA: Diagnosis not present

## 2019-02-20 DIAGNOSIS — F0390 Unspecified dementia without behavioral disturbance: Secondary | ICD-10-CM | POA: Diagnosis not present

## 2019-02-20 DIAGNOSIS — N179 Acute kidney failure, unspecified: Secondary | ICD-10-CM | POA: Diagnosis not present

## 2019-02-20 DIAGNOSIS — M25531 Pain in right wrist: Secondary | ICD-10-CM | POA: Diagnosis not present

## 2019-02-20 DIAGNOSIS — F0391 Unspecified dementia with behavioral disturbance: Secondary | ICD-10-CM | POA: Diagnosis not present

## 2019-02-20 DIAGNOSIS — F064 Anxiety disorder due to known physiological condition: Secondary | ICD-10-CM | POA: Diagnosis not present

## 2019-02-21 DIAGNOSIS — D649 Anemia, unspecified: Secondary | ICD-10-CM | POA: Diagnosis not present

## 2019-02-21 DIAGNOSIS — I10 Essential (primary) hypertension: Secondary | ICD-10-CM | POA: Diagnosis not present

## 2019-02-21 DIAGNOSIS — E119 Type 2 diabetes mellitus without complications: Secondary | ICD-10-CM | POA: Diagnosis not present

## 2019-02-22 ENCOUNTER — Encounter (HOSPITAL_COMMUNITY): Payer: Self-pay | Admitting: Urology

## 2019-02-22 NOTE — Progress Notes (Signed)
Called [facility] and LM on VM for Nursing Supervisor to call me back to get information about patient Faxed to me -requested MAR, progress note, any POA, any recent labs).

## 2019-02-24 ENCOUNTER — Encounter (HOSPITAL_COMMUNITY): Payer: Self-pay | Admitting: Urology

## 2019-02-27 ENCOUNTER — Other Ambulatory Visit: Payer: Self-pay

## 2019-02-27 ENCOUNTER — Ambulatory Visit (HOSPITAL_COMMUNITY): Payer: PPO | Admitting: Physician Assistant

## 2019-02-27 ENCOUNTER — Encounter (HOSPITAL_COMMUNITY): Payer: Self-pay | Admitting: Urology

## 2019-02-27 NOTE — Progress Notes (Signed)
SPOKE WITH [place] LPN AT [facility] ALL FAXED PRE OP INSTRUCTIONS RECEIVED AND UNDERSTOOD FOR 03-01-19 SURGERY. MADE AUSTIN AWARE POA WIFE CAMERON COMING TO SIGN CONSENT FOR DAY OF SURGERY

## 2019-02-27 NOTE — Progress Notes (Addendum)
Preop instructions for:  Lucas Vega       Date of Birth     February 15, 1951                       Date of Procedure:03-01-2019       Doctor: DR Gloriann Loan Time to arrive at [place] AM Report to: Admitting  Procedure:CYSTOSCOPY WITH RIGHT RETROGRADE PYELOGRAM, URETEROSCOPY, HOLMIUM LASER AND STENT PLACEMENT RIGHT   Do not eat or drink past midnight the night before your procedure.(To include any tube feedings-must be discontinued)    Take these morning medications only with sips of water.(or give through gastrostomy or feeding tube). HYDROCODONE IF NEEDED, TRAMADOL, ALPRAZOLAM (0.5 MG), DEPAKOTE, SERTRALINE, RISPERDOL, ALLOPURINOL, [place] contact:   [place] RN  Phone:  [phone] [place]: Nettie Elm [phone]  Transportation contact phone#:FACILITY Tarri Glenn [phone]  Please send day of procedure:current med list and meds last taken that day, confirm nothing by mouth status from what time, Patient Demographic info( to include DNR status, problem list, allergies)     Bring Insurance card and picture ID Leave all jewelry and other valuables at place where living( no metal or rings to be worn) No contact lens Women-no make-up, no lotions,perfumes,powders Men-no colognes,lotions  Any questions day of procedure,call  SHORT STAY-[phone]   Sent from :[facility]                   [phone]                   Fax:[phone]  Sent by Zelphia Cairo  RN

## 2019-02-27 NOTE — Progress Notes (Signed)
PCP - DR Mayra Neer Cardiologist - NONE  Chest x-ray - 02-03-19 EPIC EKG - 02-03-19 EPIC Stress Test - NONE ECHO - NONE Cardiac Cath - NONE  Sleep Study - OSA NO CPAP USED CPAP -   Fasting Blood Sugar -  Checks Blood Sugar _____ times a day  Blood Thinner Instructions: Aspirin Instructions: 81 MG ASPIRIN, NO ORDERS TO STOP PER ASSISTED LIVING Last Dose:  Anesthesia review: SECURE CHAT TO JESSICA ZANETTO PA  Patient denies shortness of breath, fever, cough and chest pain at PAT appointment   Patient verbalized understanding of instructions that were given to them at the PAT appointment. Patient was also instructed that they will need to review over the PAT instructions again at home before surgery.

## 2019-02-28 NOTE — Anesthesia Preprocedure Evaluation (Signed)
Anesthesia Evaluation Anesthesia Physical Anesthesia Plan  ASA:   Anesthesia Plan:    Post-op Pain Management:    Induction:   PONV Risk Score and Plan:   Airway Management Planned:   Additional Equipment:   Intra-op Plan:   Post-operative Plan:   Informed Consent:   Plan Discussed with:   Anesthesia Plan Comments: (See PAT note written 02/28/2019 by Myra Gianotti, PA-C. Needs a rapid COVID-19 test on day of surgery!  )        Anesthesia Quick Evaluation

## 2019-02-28 NOTE — Progress Notes (Signed)
Left message for Lucas Vega for dr bell at [facility] urology, patient had temperature 100.4 today and dr Park Breed at nursing home thinks this is developing uti due to renal stone. Spoke with beth at short stay to place note on front of patient chart concerning temperature 100.4 today. Placed note in special needs patient had temperature 100.4 today dr at nursing home thinks due to developing uti.secure chat with allison zelenack pa and patient is ok for surgery and will be assessed day of surgery, she spoke with dr Herbie [place] fitzgerald

## 2019-02-28 NOTE — Progress Notes (Signed)
Sent secure chat to [employer] pa, patient has temperature 100.4 at nursing home today per nurse at nursing home left message.  Patient has not had positive covid test.Is this ok for surgery?

## 2019-02-28 NOTE — Progress Notes (Signed)
Anesthesia Chart Review: SAME DAY WORK-UP   Case: [id number] Date/Time: 03/01/19 1100   Procedure: CYSTOSCOPY WITH RIGHT RETROGRADE PYELOGRAM, URETEROSCOPY WITH HOLMIUM LASER AND STENT PLACEMENT (Right )   Anesthesia type: General   Pre-op diagnosis: RIGHT RENAL STONE   [facility] RO[facility] 07 / [facility]   Surgeons: Lucas Mallow, MD      DISCUSSION: Patient is a 68 year old male scheduled for the above procedure. He presented to ED from [facility] the evening of 02/03/19 with fever, abdominal pain, and altered mental status. Work-up revealed 6 mm left UPJ stone and evidence of infection. He was admitted with sepsis due to UTI, likely related to nephrolithiasis. Urology consulted and he underwent right urethral dilation/stent 02/04/19. Blood and urine cultures grew Proteus Mirabilis, s/p antibiotics. He was discharged back to SNF [facility]) with a foley catheter.  Other history includes former smoker (quit 09/29/12), HTN, COPD, Alzheimer dementia, seizures (managed with Depakote), PE (06/15/04, post-op prostatectomy), OSA (intolerant to CPAP), prostate cancer (s/p radical prostatectomy 06/2004), BPH, CKD, chronic pain, recurrent falls (history gait abnormality, left foot drop 2019), C3-4 ACDF 12/13/17. DM2 is listed, but currently no DM medication and last A1c seen was < 4.2 on 12/08/17.   Per communication with [facility] PST RN, SNF staff reported patient T 100.18F 02/28/19 with evaluation or communication by a Dr. Park Breed who reportedly thought patient may be developing a UTI. Discussed with anesthesiologist Suzette Battiest, MD. Unable to make definitive pre-anesthesia recommendations given limitations of patient being a same day work-up. If evaluating provider at SNF feels low grade fever is likely related to urinary issues and otherwise no concerning symptoms for COVID-19 then patient would arrive to [facility] and receive rapid COVID-19 test. Additional anesthesiologist recommendations at that time based  on results and exam findings. Sloan Leiter, RN with [facility] PST to notify Dr. Gloriann Loan and/or his staff about patient's low grade temperature and also recommended communication with [facility]/Short Stay staff.       VS: There were no vitals taken for this visit.  As of 02/07/19, BP 142/74, HR 62.    PROVIDERS: Mayra Neer, MD is listed as PCP  Kathrynn Ducking, MD is neurologist. Last visit seen 10/28/17.   LABS: As of 02/07/19, Cr 0.93, glucose 154. As of 02/06/19, H/H 8.9/27.9. Any updated labs will be done on the day of procedure.    IMAGES: CT abd/pelvis 02/04/19: IMPRESSION: 1. Obstructing right UPJ calculus measuring up to 6 mm in maximal diameter. Mild resulting hydronephrosis. 2. More distal right ureterectasis and periureteral stranding could reflect a an additional recently passed calculus or potential ascending urinary tract infection. Correlate with urinalysis. 3. Additional nonobstructing calculus in the lower pole of the left kidney. 4. Colonic diverticulosis without evidence of diverticulitis. 5. Surgical clips in the right inguinal region, correlate for history of inguinal hernia repair. 6. Aortic Atherosclerosis (ICD10-I70.0).  1V PCXR 02/03/19: FINDINGS: The heart size and mediastinal contours are within normal limits. Both lungs are clear. The visualized skeletal structures are unremarkable. IMPRESSION: No active disease.   EKG: EKG 02/03/19 21:18:22 Poor data quality, interpretation may be adversely affected ?nsr no acutely ischemic changes noted does not appear changed from ekg 5 hours prior Confirmed by Pattricia Boss [phone]) on 02/04/2019 2:32:58 PM  EKG 02/03/19 20:15:23 Sinus rhythm Interpretation limited secondary to artifact Confirmed by Quintella Reichert [phone]) on 02/03/2019 9:16:53 PM   CV: N/A  Past Medical History:  Diagnosis Date  . Alzheimer's disease with  early onset (CODE) ([place]) 2015  . Anorexia   . Anxiety    per  note from [facility]  dated 02/08/2019  . BPH (benign prostatic hyperplasia)   . Chronic kidney disease   . Chronic pain   . Constipation   . Convulsions/seizures ([place]) 07/05/2014   last one 1 1/2 years ago as of 12/08/17  . COPD (chronic obstructive pulmonary disease) ([place])   . Depression   . Diabetes mellitus without complication ([place])   . Gait abnormality 10/28/2017  . Gout   . History of kidney stones   . History of pulmonary embolism    after surgery ? 2004  . Hypertension   . Hypokalemia   . Insomnia   . Left foot drop 10/28/2017  . Memory disturbance 01/26/2013  . Pneumonia    as a child  . Prostate cancer ([place])   . Recurrent falls   . RLS (restless legs syndrome)   . Sleep apnea    "mild case" - tried cpap but he couldn't use it.  . Vitamin D deficiency     Past Surgical History:  Procedure Laterality Date  . ANTERIOR CERVICAL DECOMP/DISCECTOMY FUSION N/A 12/13/2017   Procedure: ANTERIOR CERVICAL DECOMPRESSION/DISCECTOMY FUSION CERVICAL THREE-CERVICAL FOUR;  Surgeon: Kary Kos, MD;  Location: [place];  Service: Neurosurgery;  Laterality: N/A;  ANTERIOR CERVICAL DECOMPRESSION/DISCECTOMY FUSION CERVICAL THREE-CERVICAL FOUR  . COLONOSCOPY  2009   JAN  . CYSTOSCOPY WITH RETROGRADE PYELOGRAM, URETEROSCOPY AND STENT PLACEMENT Right 02/04/2019   Procedure: CYSTOSCOPY WITH RETROGRADE PYELOGRAM,  AND STENT PLACEMENT urethral dilation;  Surgeon: Lucas Mallow, MD;  [facility];  Service: Urology;  Laterality: Right;  . PROSTATECTOMY    . TONSILLECTOMY AND ADENOIDECTOMY      MEDICATIONS: No current facility-administered medications for this encounter.   Marland Kitchen allopurinol (ZYLOPRIM) 100 MG tablet  . ALPRAZolam (XANAX) 0.25 MG tablet  . ALPRAZolam (XANAX) 0.5 MG tablet  . aspirin 81 MG chewable tablet  . atenolol-chlorthalidone (TENORETIC) 50-25 MG per tablet  . atorvastatin (LIPITOR) 10 MG tablet  . Cholecalciferol 50 MCG (2000 UT) TABS  . divalproex (DEPAKOTE) 250 MG  DR tablet  . HYDROcodone-acetaminophen (NORCO/VICODIN) 5-325 MG tablet  . KLOR-CON M20 20 MEQ tablet  . Lidocaine HCl (ASPERCREME W/LIDOCAINE) 4 % CREA  . megestrol (MEGACE) 40 MG tablet  . Melatonin 3 MG CAPS  . memantine (NAMENDA) 10 MG tablet  . polyethylene glycol (MIRALAX / GLYCOLAX) 17 g packet  . Probiotic Product (RISA-BID PROBIOTIC PO)  . risperiDONE (RISPERDAL) 0.5 MG tablet  . sertraline (ZOLOFT) 100 MG tablet  . Skin Protectants, Misc. (MINERIN CREME EX)  . traMADol (ULTRAM) 50 MG tablet    Myra Gianotti, PA-C Surgical Short Stay/Anesthesiology [facility] Phone [phone] [facility] Phone [phone] 02/28/2019 2:50 PM

## 2019-03-01 ENCOUNTER — Encounter (HOSPITAL_COMMUNITY): Payer: Self-pay | Admitting: Urology

## 2019-03-01 ENCOUNTER — Encounter (HOSPITAL_COMMUNITY)
Admission: RE | Disposition: A | Discharge status: Home / Self Care | Payer: Self-pay | Source: Home / Self Care | Attending: Urology

## 2019-03-01 ENCOUNTER — Ambulatory Visit (HOSPITAL_COMMUNITY)
Admission: RE | Admit: 2019-03-01 | Discharge: 2019-03-01 | Disposition: A | Discharge status: Home / Self Care | Payer: PPO | Attending: Urology | Admitting: Urology

## 2019-03-01 DIAGNOSIS — M2550 Pain in unspecified joint: Secondary | ICD-10-CM | POA: Diagnosis not present

## 2019-03-01 DIAGNOSIS — Z79899 Other long term (current) drug therapy: Secondary | ICD-10-CM | POA: Insufficient documentation

## 2019-03-01 DIAGNOSIS — N390 Urinary tract infection, site not specified: Secondary | ICD-10-CM | POA: Diagnosis not present

## 2019-03-01 DIAGNOSIS — R509 Fever, unspecified: Secondary | ICD-10-CM | POA: Diagnosis not present

## 2019-03-01 DIAGNOSIS — N201 Calculus of ureter: Secondary | ICD-10-CM | POA: Diagnosis not present

## 2019-03-01 DIAGNOSIS — G4733 Obstructive sleep apnea (adult) (pediatric): Secondary | ICD-10-CM | POA: Insufficient documentation

## 2019-03-01 DIAGNOSIS — N189 Chronic kidney disease, unspecified: Secondary | ICD-10-CM | POA: Insufficient documentation

## 2019-03-01 DIAGNOSIS — N400 Enlarged prostate without lower urinary tract symptoms: Secondary | ICD-10-CM | POA: Diagnosis not present

## 2019-03-01 DIAGNOSIS — G8929 Other chronic pain: Secondary | ICD-10-CM | POA: Diagnosis not present

## 2019-03-01 DIAGNOSIS — E876 Hypokalemia: Secondary | ICD-10-CM | POA: Diagnosis not present

## 2019-03-01 DIAGNOSIS — F329 Major depressive disorder, single episode, unspecified: Secondary | ICD-10-CM | POA: Insufficient documentation

## 2019-03-01 DIAGNOSIS — F0280 Dementia in other diseases classified elsewhere without behavioral disturbance: Secondary | ICD-10-CM | POA: Insufficient documentation

## 2019-03-01 DIAGNOSIS — G4700 Insomnia, unspecified: Secondary | ICD-10-CM | POA: Insufficient documentation

## 2019-03-01 DIAGNOSIS — R5381 Other malaise: Secondary | ICD-10-CM | POA: Diagnosis not present

## 2019-03-01 DIAGNOSIS — E1122 Type 2 diabetes mellitus with diabetic chronic kidney disease: Secondary | ICD-10-CM | POA: Insufficient documentation

## 2019-03-01 DIAGNOSIS — F419 Anxiety disorder, unspecified: Secondary | ICD-10-CM | POA: Insufficient documentation

## 2019-03-01 DIAGNOSIS — Z539 Procedure and treatment not carried out, unspecified reason: Secondary | ICD-10-CM | POA: Diagnosis not present

## 2019-03-01 DIAGNOSIS — I129 Hypertensive chronic kidney disease with stage 1 through stage 4 chronic kidney disease, or unspecified chronic kidney disease: Secondary | ICD-10-CM | POA: Diagnosis not present

## 2019-03-01 DIAGNOSIS — F064 Anxiety disorder due to known physiological condition: Secondary | ICD-10-CM | POA: Diagnosis not present

## 2019-03-01 DIAGNOSIS — Z86711 Personal history of pulmonary embolism: Secondary | ICD-10-CM | POA: Insufficient documentation

## 2019-03-01 DIAGNOSIS — U071 COVID-19: Secondary | ICD-10-CM | POA: Insufficient documentation

## 2019-03-01 DIAGNOSIS — Z87891 Personal history of nicotine dependence: Secondary | ICD-10-CM | POA: Insufficient documentation

## 2019-03-01 DIAGNOSIS — J449 Chronic obstructive pulmonary disease, unspecified: Secondary | ICD-10-CM | POA: Insufficient documentation

## 2019-03-01 DIAGNOSIS — E7800 Pure hypercholesterolemia, unspecified: Secondary | ICD-10-CM | POA: Diagnosis not present

## 2019-03-01 DIAGNOSIS — A419 Sepsis, unspecified organism: Secondary | ICD-10-CM | POA: Diagnosis not present

## 2019-03-01 DIAGNOSIS — R410 Disorientation, unspecified: Secondary | ICD-10-CM | POA: Diagnosis not present

## 2019-03-01 DIAGNOSIS — R569 Unspecified convulsions: Secondary | ICD-10-CM | POA: Diagnosis not present

## 2019-03-01 DIAGNOSIS — G309 Alzheimer's disease, unspecified: Secondary | ICD-10-CM | POA: Diagnosis not present

## 2019-03-01 DIAGNOSIS — G300 Alzheimer's disease with early onset: Secondary | ICD-10-CM | POA: Diagnosis not present

## 2019-03-01 DIAGNOSIS — N179 Acute kidney failure, unspecified: Secondary | ICD-10-CM | POA: Diagnosis not present

## 2019-03-01 DIAGNOSIS — Z8546 Personal history of malignant neoplasm of prostate: Secondary | ICD-10-CM | POA: Diagnosis not present

## 2019-03-01 DIAGNOSIS — Z7982 Long term (current) use of aspirin: Secondary | ICD-10-CM | POA: Insufficient documentation

## 2019-03-01 DIAGNOSIS — Z87442 Personal history of urinary calculi: Secondary | ICD-10-CM | POA: Diagnosis not present

## 2019-03-01 DIAGNOSIS — Z9079 Acquired absence of other genital organ(s): Secondary | ICD-10-CM | POA: Insufficient documentation

## 2019-03-01 DIAGNOSIS — R7989 Other specified abnormal findings of blood chemistry: Secondary | ICD-10-CM | POA: Diagnosis not present

## 2019-03-01 DIAGNOSIS — M25531 Pain in right wrist: Secondary | ICD-10-CM | POA: Diagnosis not present

## 2019-03-01 DIAGNOSIS — R296 Repeated falls: Secondary | ICD-10-CM | POA: Insufficient documentation

## 2019-03-01 DIAGNOSIS — F0390 Unspecified dementia without behavioral disturbance: Secondary | ICD-10-CM | POA: Diagnosis not present

## 2019-03-01 DIAGNOSIS — G2581 Restless legs syndrome: Secondary | ICD-10-CM | POA: Insufficient documentation

## 2019-03-01 DIAGNOSIS — Z7401 Bed confinement status: Secondary | ICD-10-CM | POA: Diagnosis not present

## 2019-03-01 DIAGNOSIS — Z1159 Encounter for screening for other viral diseases: Secondary | ICD-10-CM | POA: Diagnosis not present

## 2019-03-01 HISTORY — DX: Hypokalemia: E87.6

## 2019-03-01 HISTORY — DX: Constipation, unspecified: K59.00

## 2019-03-01 HISTORY — DX: Other chronic pain: G89.29

## 2019-03-01 HISTORY — DX: Type 2 diabetes mellitus without complications: E11.9

## 2019-03-01 HISTORY — DX: Benign prostatic hyperplasia without lower urinary tract symptoms: N40.0

## 2019-03-01 HISTORY — DX: Anorexia: R63.0

## 2019-03-01 HISTORY — DX: Insomnia, unspecified: G47.00

## 2019-03-01 HISTORY — DX: Repeated falls: R29.6

## 2019-03-01 HISTORY — DX: Vitamin D deficiency, unspecified: E55.9

## 2019-03-01 HISTORY — DX: Anxiety disorder, unspecified: F41.9

## 2019-03-01 LAB — CBC
HCT: 31.7 % — ABNORMAL LOW (ref 39.0–52.0)
Hemoglobin: 9.7 g/dL — ABNORMAL LOW (ref 13.0–17.0)
MCH: 28.9 pg (ref 26.0–34.0)
MCHC: 30.6 g/dL (ref 30.0–36.0)
MCV: 94.3 fL (ref 80.0–100.0)
Platelets: 297 10*3/uL (ref 150–400)
RBC: 3.36 MIL/uL — ABNORMAL LOW (ref 4.22–5.81)
RDW: 14.8 % (ref 11.5–15.5)
WBC: 14.2 10*3/uL — ABNORMAL HIGH (ref 4.0–10.5)
nRBC: 0 % (ref 0.0–0.2)

## 2019-03-01 LAB — BASIC METABOLIC PANEL
Anion gap: 10 (ref 5–15)
BUN: 34 mg/dL — ABNORMAL HIGH (ref 8–23)
CO2: 25 mmol/L (ref 22–32)
Calcium: 8.6 mg/dL — ABNORMAL LOW (ref 8.9–10.3)
Chloride: 103 mmol/L (ref 98–111)
Creatinine, Ser: 1.18 mg/dL (ref 0.61–1.24)
GFR calc Af Amer: >60 mL/min (ref >60)
GFR calc non Af Amer: >60 mL/min (ref >60)
Glucose, Bld: 177 mg/dL — ABNORMAL HIGH (ref 70–99)
Potassium: 4.2 mmol/L (ref 3.5–5.1)
Sodium: 138 mmol/L (ref 135–145)

## 2019-03-01 LAB — RESPIRATORY PANEL BY RT PCR (FLU A&B, COVID)
Influenza A by PCR: NEGATIVE
Influenza B by PCR: NEGATIVE
SARS Coronavirus 2 by RT PCR: POSITIVE — AB

## 2019-03-01 LAB — HEMOGLOBIN A1C
Hgb A1c MFr Bld: 7 % — ABNORMAL HIGH (ref 4.8–5.6)
Mean Plasma Glucose: 154.2 mg/dL

## 2019-03-01 LAB — GLUCOSE, CAPILLARY: Glucose-Capillary: 157 mg/dL — ABNORMAL HIGH (ref 70–99)

## 2019-03-01 SURGERY — CYSTOURETEROSCOPY, WITH RETROGRADE PYELOGRAM AND STENT INSERTION
Anesthesia: General | Laterality: Right

## 2019-03-01 MED ORDER — LIDOCAINE 2% (20 MG/ML) 5 ML SYRINGE
INTRAMUSCULAR | Status: AC
  Filled 2019-03-01: qty 5

## 2019-03-01 MED ORDER — ATENOLOL 50 MG PO TABS: 50.0000 mg | ORAL_TABLET | Freq: Once | ORAL | Status: DC

## 2019-03-01 MED ORDER — ATENOLOL 25 MG PO TABS
25.0000 mg | ORAL_TABLET | Freq: Once | ORAL | Status: DC
  Filled 2019-03-01: qty 1

## 2019-03-01 MED ORDER — FENTANYL CITRATE (PF) 100 MCG/2ML IJ SOLN
INTRAMUSCULAR | Status: AC
  Filled 2019-03-01: qty 2

## 2019-03-01 MED ORDER — ONDANSETRON HCL 4 MG/2ML IJ SOLN
INTRAMUSCULAR | Status: AC
  Filled 2019-03-01: qty 2

## 2019-03-01 MED ORDER — CEFAZOLIN SODIUM-DEXTROSE 2-4 GM/100ML-% IV SOLN
2.0000 g | INTRAVENOUS | Status: DC
  Filled 2019-03-01: qty 100

## 2019-03-01 MED ORDER — DEXAMETHASONE SODIUM PHOSPHATE 10 MG/ML IJ SOLN
INTRAMUSCULAR | Status: AC
  Filled 2019-03-01: qty 1

## 2019-03-01 MED ORDER — PROPOFOL 10 MG/ML IV BOLUS
INTRAVENOUS | Status: AC
  Filled 2019-03-01: qty 20

## 2019-03-01 MED ORDER — LACTATED RINGERS IV SOLN: INTRAVENOUS | Status: DC

## 2019-03-01 NOTE — Progress Notes (Signed)
Per Dr Link Snuffer case cancel (d/t positive Covid) and Pt to be discharged, return to [facility] via [place].  Report called to Levell July, LPN supervisor, reported positive covid result. She states Dr Posey Pronto will assume care at [facility]. Pt discharged to [facility], No cough or other symptoms. Wife Pranit Sokol also notified of status.

## 2019-03-04 DIAGNOSIS — D649 Anemia, unspecified: Secondary | ICD-10-CM | POA: Diagnosis not present

## 2019-03-04 DIAGNOSIS — G300 Alzheimer's disease with early onset: Secondary | ICD-10-CM | POA: Diagnosis not present

## 2019-03-06 ENCOUNTER — Other Ambulatory Visit: Payer: Self-pay | Admitting: Urology

## 2019-03-06 DIAGNOSIS — R509 Fever, unspecified: Secondary | ICD-10-CM | POA: Diagnosis not present

## 2019-03-06 DIAGNOSIS — I129 Hypertensive chronic kidney disease with stage 1 through stage 4 chronic kidney disease, or unspecified chronic kidney disease: Secondary | ICD-10-CM | POA: Diagnosis not present

## 2019-03-06 DIAGNOSIS — J9600 Acute respiratory failure, unspecified whether with hypoxia or hypercapnia: Secondary | ICD-10-CM | POA: Diagnosis not present

## 2019-03-06 DIAGNOSIS — D72828 Other elevated white blood cell count: Secondary | ICD-10-CM | POA: Diagnosis not present

## 2019-03-06 DIAGNOSIS — D649 Anemia, unspecified: Secondary | ICD-10-CM | POA: Diagnosis not present

## 2019-03-06 DIAGNOSIS — Z03818 Encounter for observation for suspected exposure to other biological agents ruled out: Secondary | ICD-10-CM | POA: Diagnosis not present

## 2019-03-06 DIAGNOSIS — U071 COVID-19: Secondary | ICD-10-CM | POA: Diagnosis not present

## 2019-03-07 DIAGNOSIS — R319 Hematuria, unspecified: Secondary | ICD-10-CM | POA: Diagnosis not present

## 2019-03-07 DIAGNOSIS — N390 Urinary tract infection, site not specified: Secondary | ICD-10-CM | POA: Diagnosis not present

## 2019-03-10 DIAGNOSIS — I00 Rheumatic fever without heart involvement: Secondary | ICD-10-CM | POA: Diagnosis not present

## 2019-03-10 DIAGNOSIS — D649 Anemia, unspecified: Secondary | ICD-10-CM | POA: Diagnosis not present

## 2019-03-16 ENCOUNTER — Encounter (HOSPITAL_COMMUNITY): Payer: Self-pay | Admitting: Physician Assistant

## 2019-04-03 ENCOUNTER — Ambulatory Visit (HOSPITAL_BASED_OUTPATIENT_CLINIC_OR_DEPARTMENT_OTHER): Admit: 2019-04-03 | Payer: PPO | Admitting: Urology

## 2019-04-03 SURGERY — CYSTOURETEROSCOPY, WITH RETROGRADE PYELOGRAM AND STENT INSERTION
Anesthesia: General | Laterality: Right

## 2019-04-10 DEATH — deceased

## 2019-10-04 IMAGING — CR DG ELBOW COMPLETE 3+V*L*
5 series · 5 of 5 positions shown · non-contrast
Comparison: None.

CLINICAL DATA: Injured left elbow during a fall.

EXAM:
LEFT ELBOW - COMPLETE 3+ VIEW

[x elbow obl left (1 of 4)]
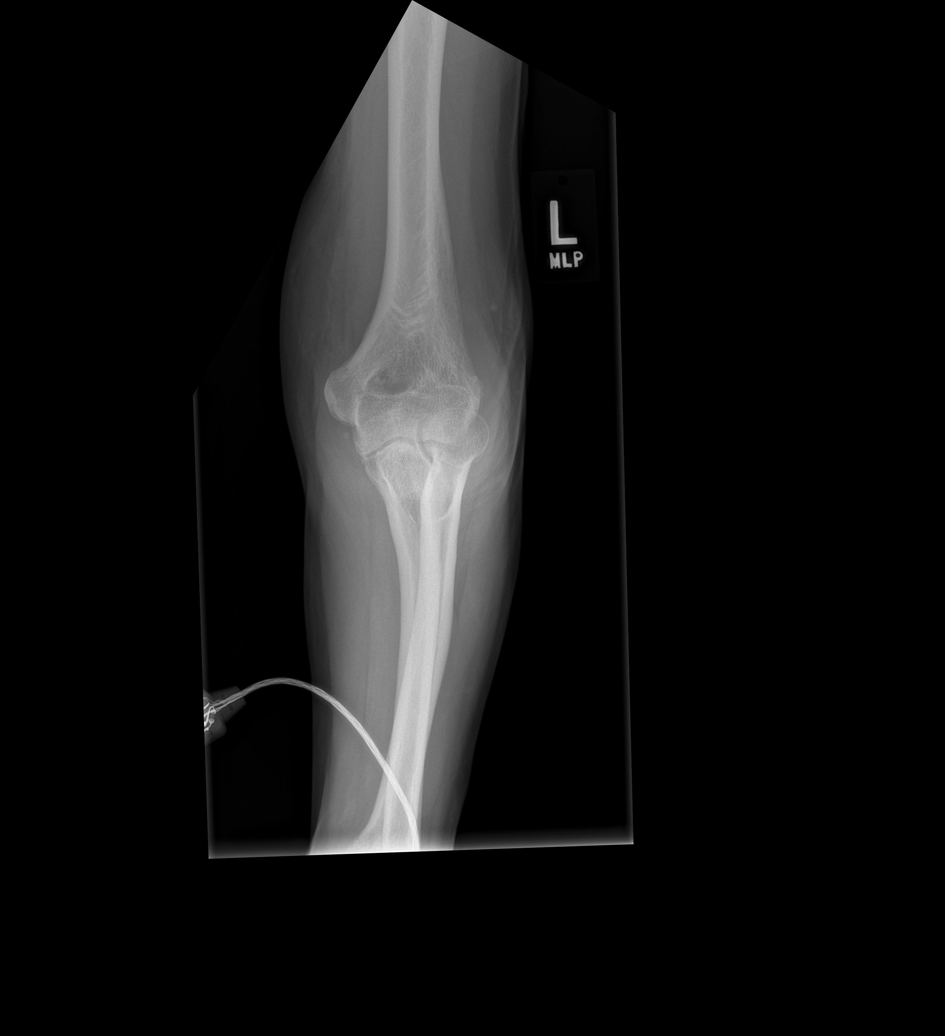

[x elbow obl left (2 of 4)]
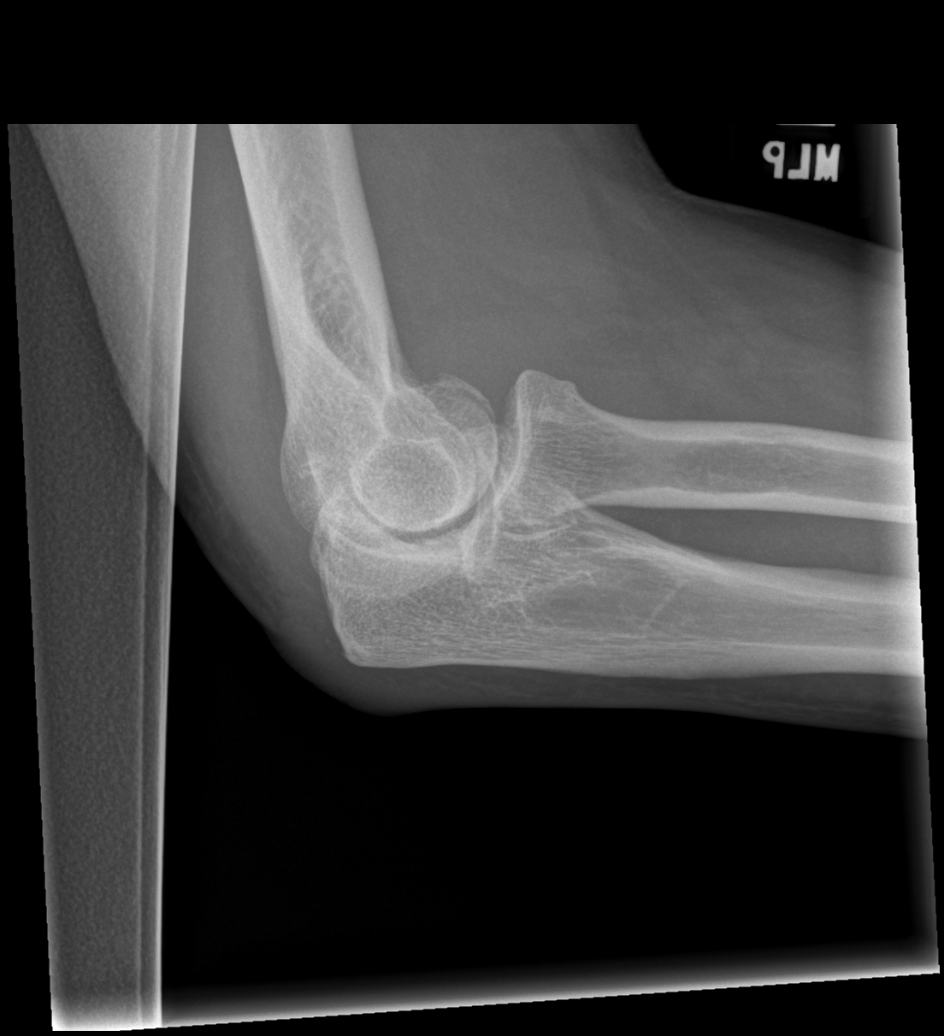

[x elbow obl left (3 of 4)]
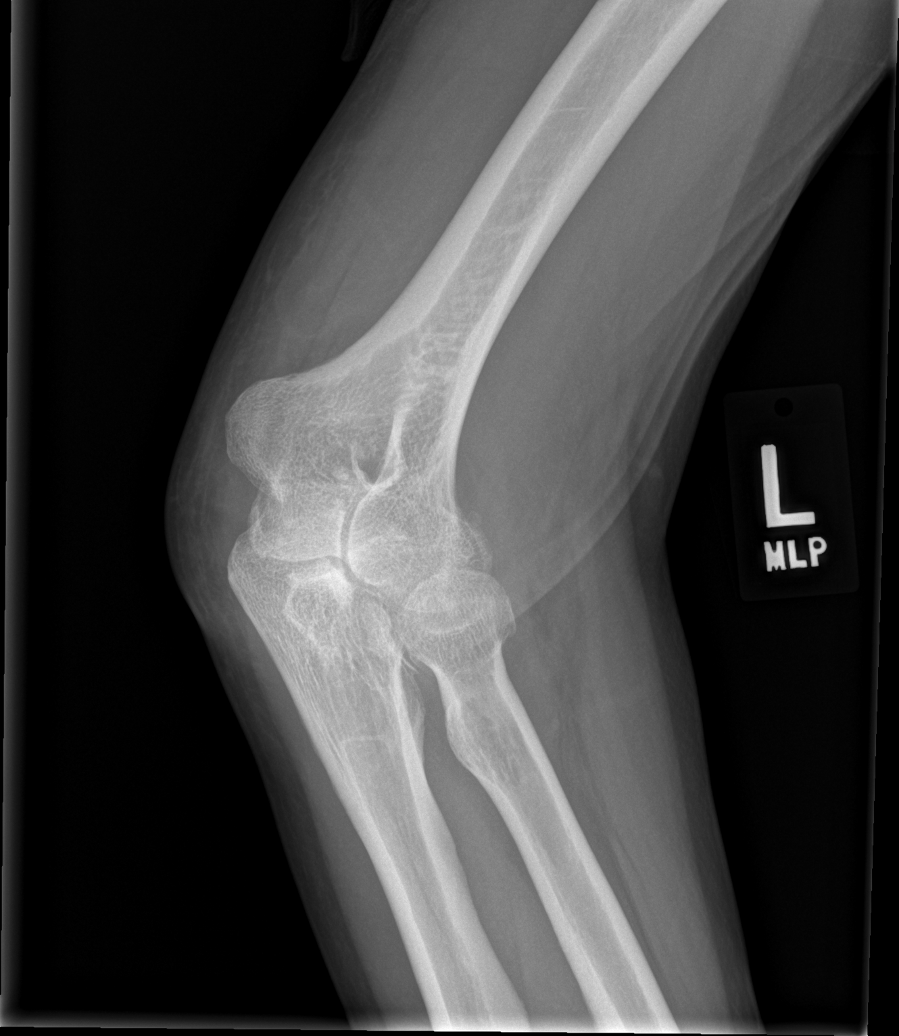

[x elbow obl left (4 of 4)]
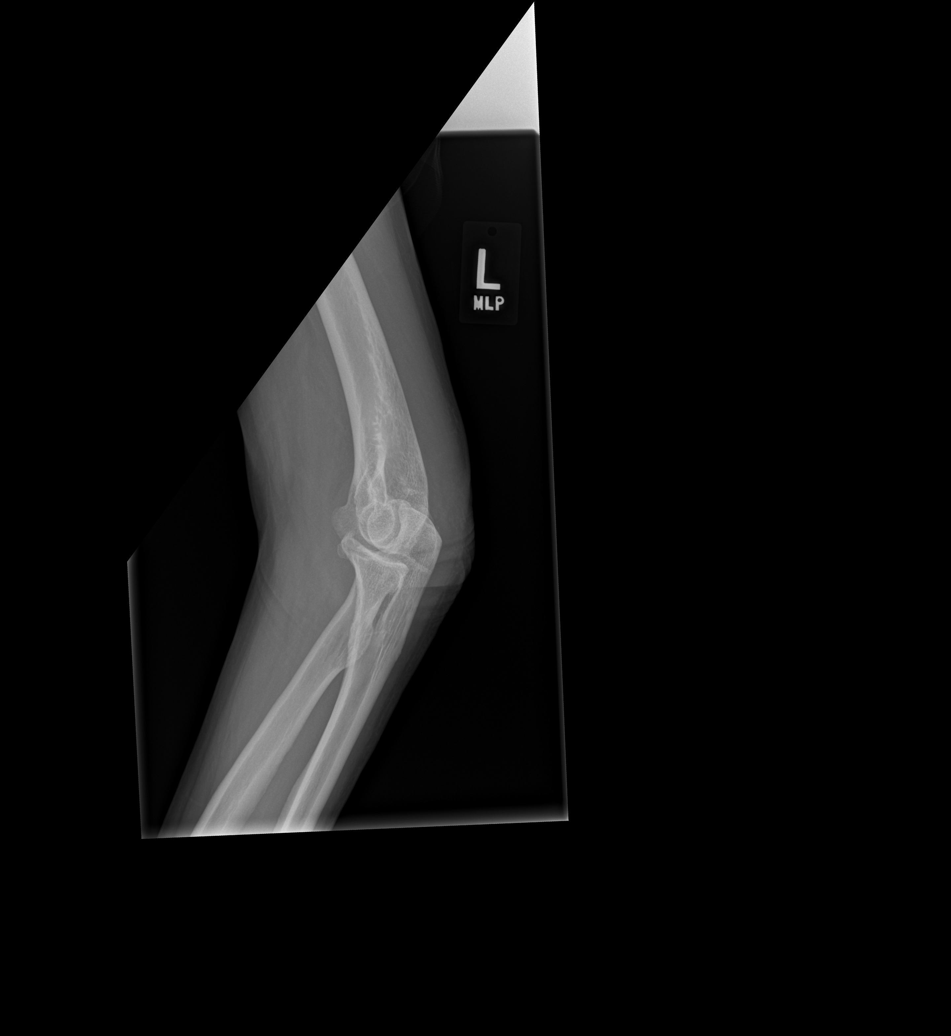

[x elbow lat left]
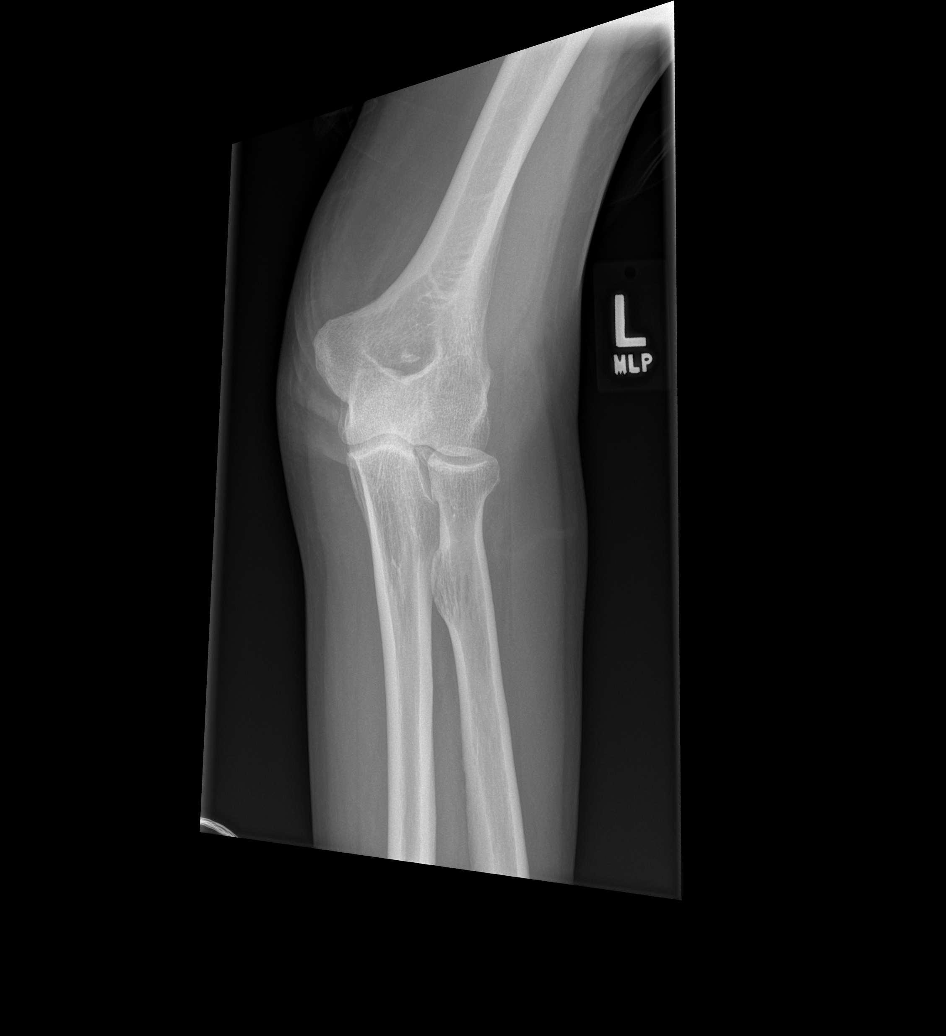

[5 of 5 positions shown; findings below may reference images not displayed]

FINDINGS: The joint spaces are maintained. There are moderate degenerative
changes. No definite acute fracture. No joint effusion.
IMPRESSION: Moderate degenerative changes but no acute fracture or joint
effusion.

## 2019-10-07 IMAGING — CR DG TIBIA/FIBULA 2V*L*
4 series · 4 of 4 positions shown · non-contrast
Comparison: None.

CLINICAL DATA: Fall with left calf pain and swelling. Initial
encounter.

EXAM:
LEFT TIBIA AND FIBULA - 2 VIEW

[x tib-fib ap left (1 of 2)]
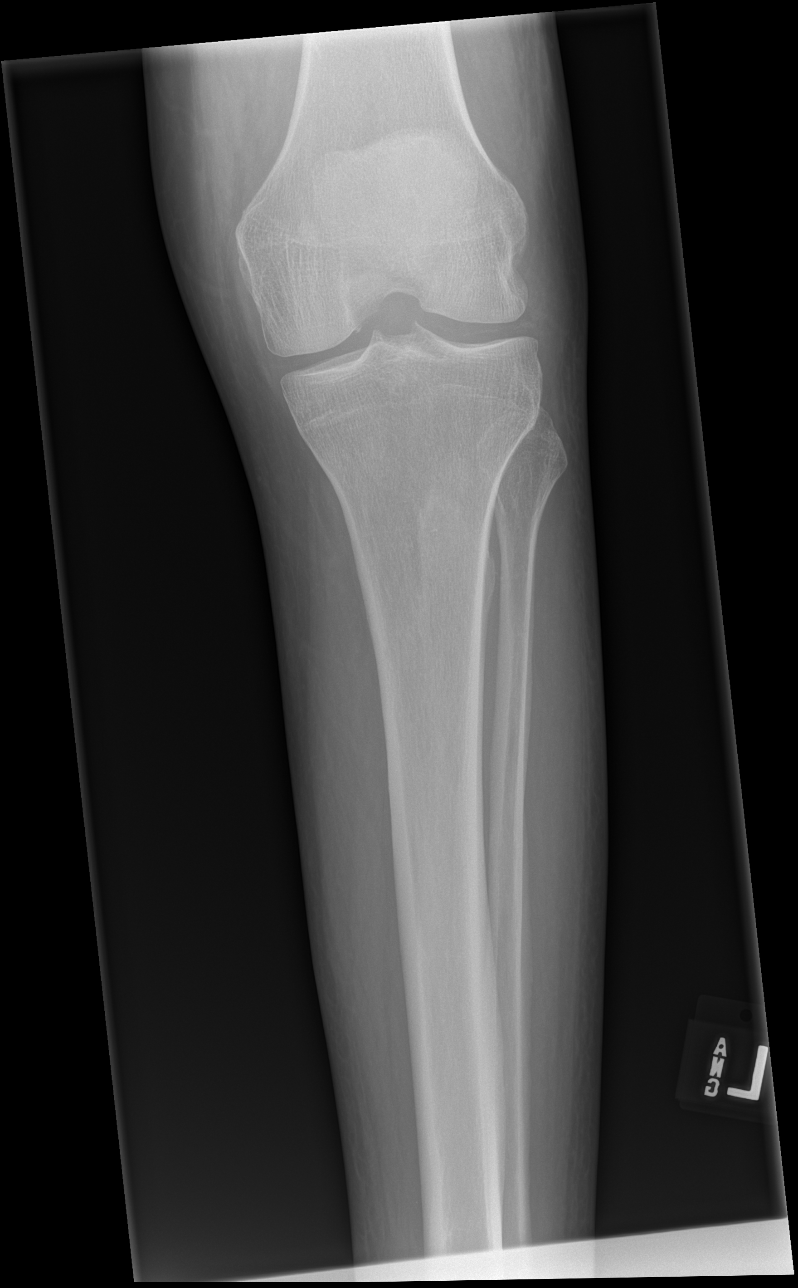

[x tib-fib ap left (2 of 2)]
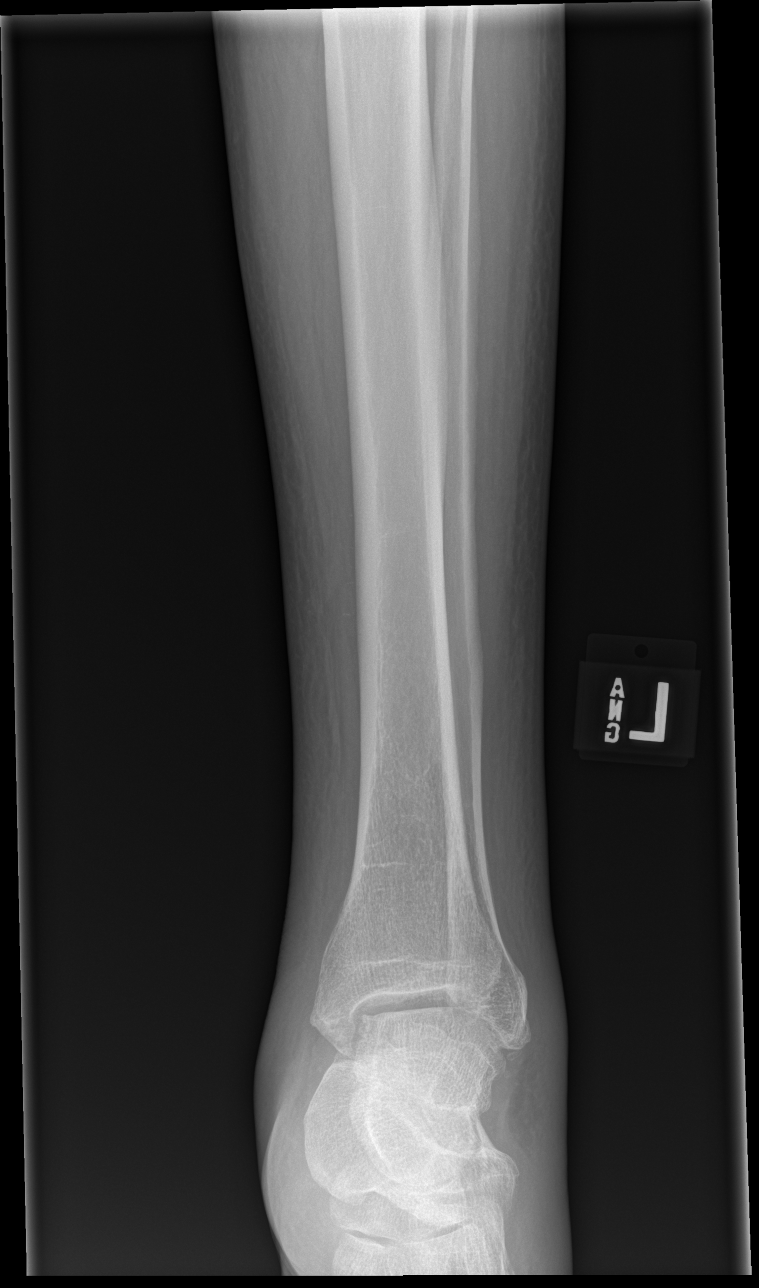

[x tib-fib lat left (1 of 2)]
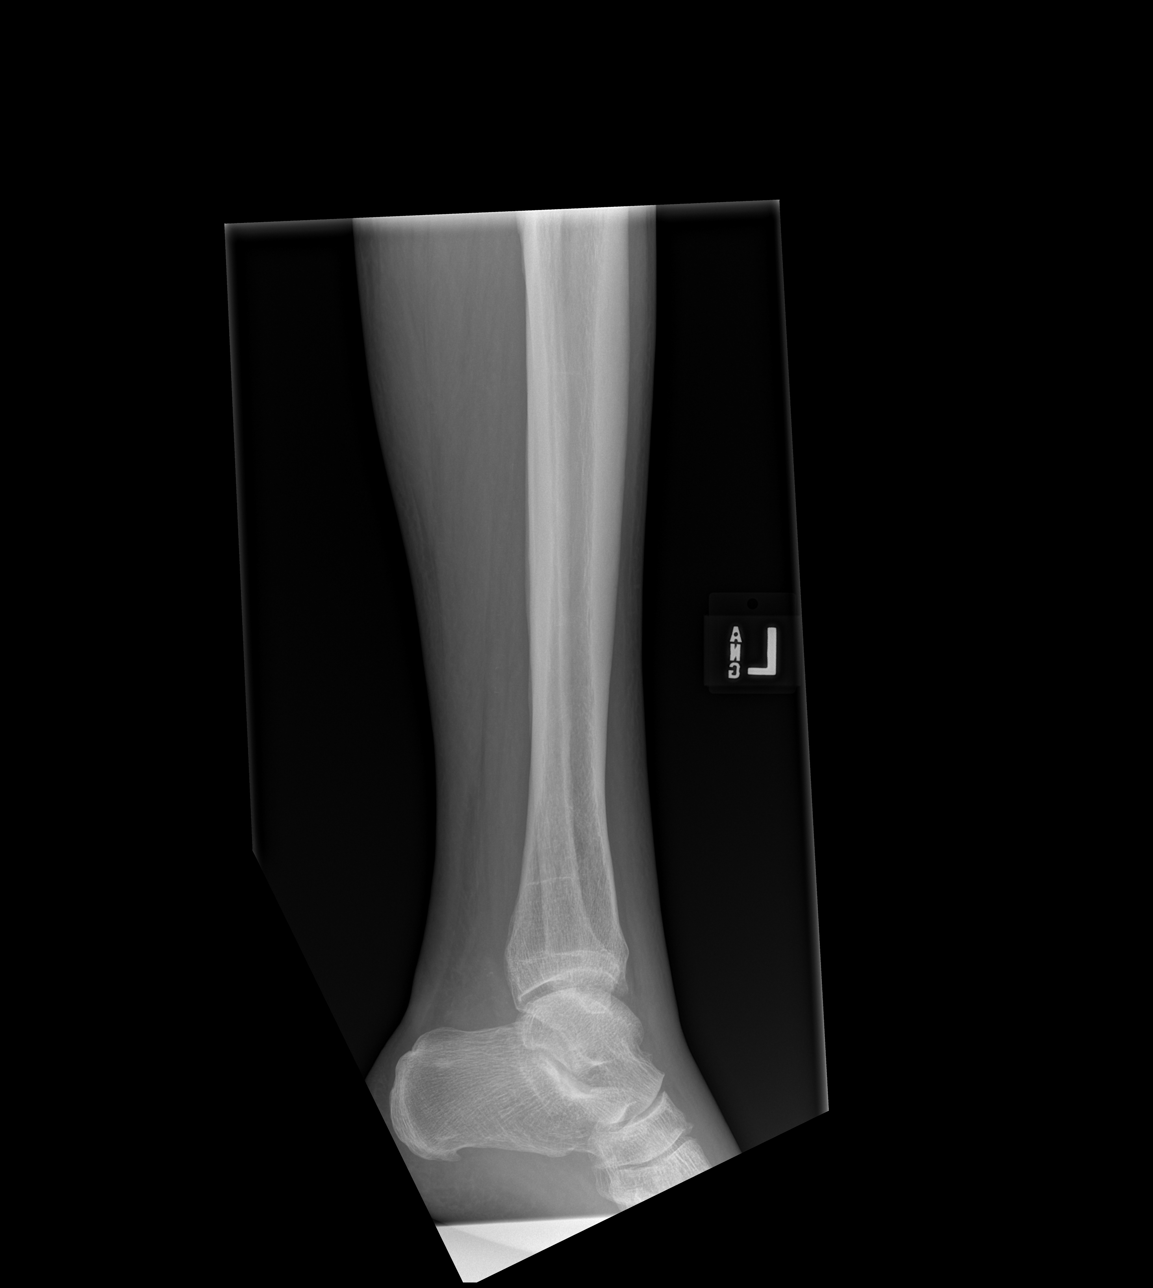

[x tib-fib lat left (2 of 2)]
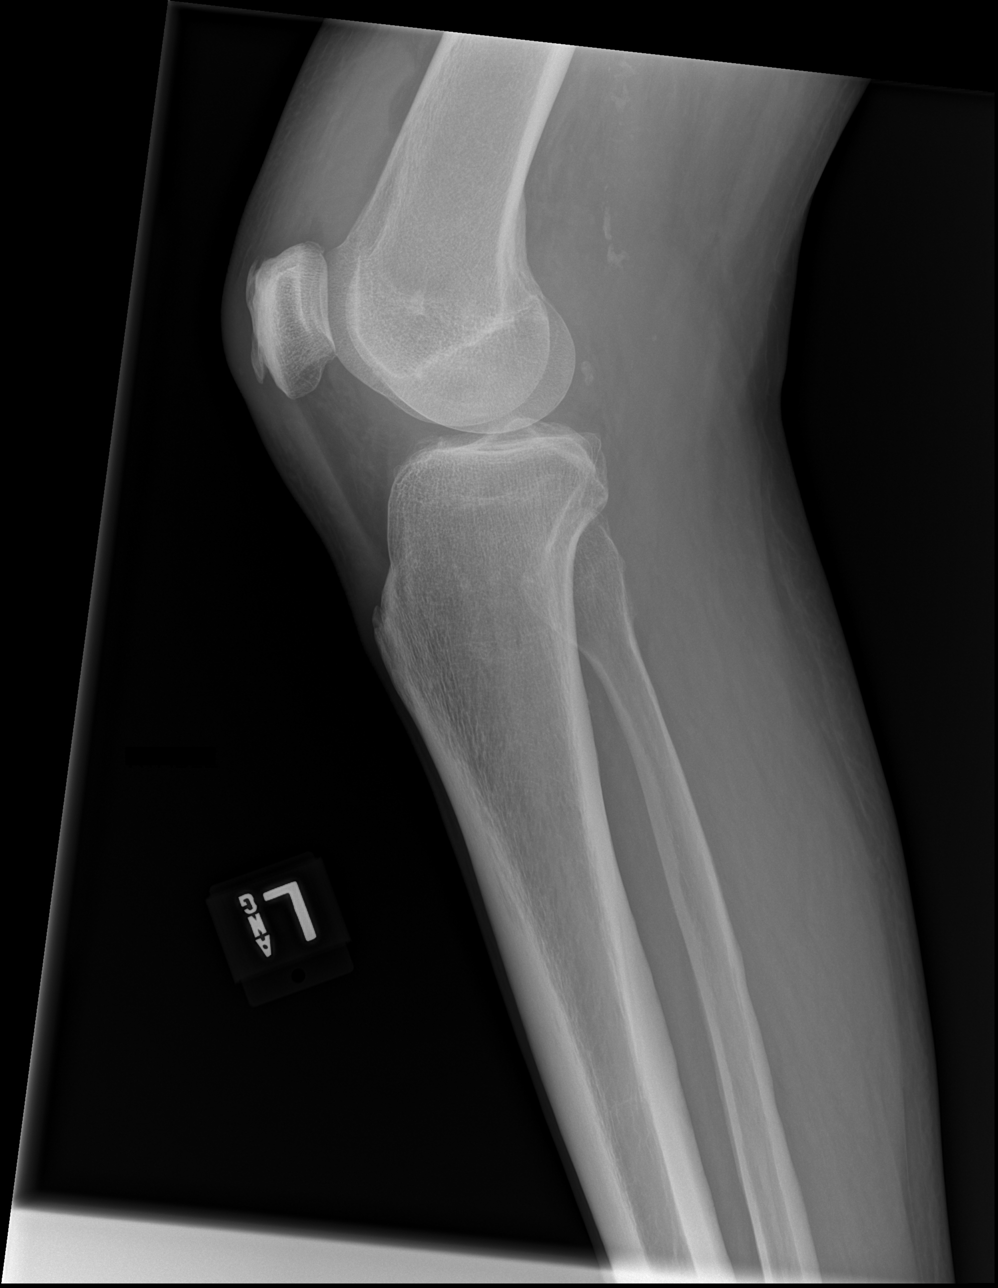

[4 of 4 positions shown; findings below may reference images not displayed]

FINDINGS: Knee joint effusion is seen on the lateral view. No fracture or
dislocation. There is nonspecific generalized subcutaneous
reticulation.
IMPRESSION: 1. No acute osseous finding.
2. Joint effusion and nonspecific soft tissue swelling.
